# Patient Record
Sex: Female | Born: 1970 | Race: Black or African American | Hispanic: No | Marital: Single | Smoking: Never smoker
Health system: Southern US, Community
[De-identification: ages and names within clinical notes are randomized; demographics above are authoritative.]

## PROBLEM LIST (undated history)

## (undated) DIAGNOSIS — C50919 Malignant neoplasm of unspecified site of unspecified female breast: Secondary | ICD-10-CM

## (undated) DIAGNOSIS — I10 Essential (primary) hypertension: Secondary | ICD-10-CM

## (undated) HISTORY — DX: Essential (primary) hypertension: I10

## (undated) HISTORY — DX: Malignant neoplasm of unspecified site of unspecified female breast: C50.919

---

## 1997-06-23 HISTORY — PX: GALLBLADDER SURGERY: SHX652

## 2004-05-20 ENCOUNTER — Other Ambulatory Visit: Admission: RE | Admit: 2004-05-20 | Discharge: 2004-05-20 | Payer: Self-pay | Admitting: Obstetrics and Gynecology

## 2004-10-26 ENCOUNTER — Emergency Department: Payer: Self-pay | Admitting: Emergency Medicine

## 2005-01-01 ENCOUNTER — Other Ambulatory Visit: Admission: RE | Admit: 2005-01-01 | Discharge: 2005-01-01 | Payer: Self-pay | Admitting: Obstetrics and Gynecology

## 2005-06-27 ENCOUNTER — Other Ambulatory Visit: Admission: RE | Admit: 2005-06-27 | Discharge: 2005-06-27 | Payer: Self-pay | Admitting: Obstetrics and Gynecology

## 2006-07-20 ENCOUNTER — Inpatient Hospital Stay (HOSPITAL_COMMUNITY): Admission: AD | Admit: 2006-07-20 | Discharge: 2006-07-22 | Payer: Self-pay | Admitting: Obstetrics and Gynecology

## 2006-09-07 ENCOUNTER — Encounter: Payer: Self-pay | Admitting: Internal Medicine

## 2006-09-07 LAB — CONVERTED CEMR LAB

## 2006-10-22 ENCOUNTER — Ambulatory Visit: Payer: Self-pay | Admitting: Internal Medicine

## 2006-10-22 LAB — CONVERTED CEMR LAB
ALT: 17 units/L (ref 0–40)
AST: 18 units/L (ref 0–37)
Albumin: 3.8 g/dL (ref 3.5–5.2)
Alkaline Phosphatase: 66 units/L (ref 39–117)
BUN: 10 mg/dL (ref 6–23)
Bacteria, UA: NEGATIVE
Basophils Absolute: 0 10*3/uL (ref 0.0–0.1)
Basophils Relative: 0.6 % (ref 0.0–1.0)
Bilirubin Urine: NEGATIVE
Bilirubin, Direct: 0.2 mg/dL (ref 0.0–0.3)
CO2: 30 meq/L (ref 19–32)
Calcium: 9.4 mg/dL (ref 8.4–10.5)
Chloride: 108 meq/L (ref 96–112)
Cholesterol: 149 mg/dL (ref 0–200)
Creatinine, Ser: 0.6 mg/dL (ref 0.4–1.2)
Crystals: NEGATIVE
Eosinophils Absolute: 0.2 10*3/uL (ref 0.0–0.6)
Eosinophils Relative: 3.6 % (ref 0.0–5.0)
GFR calc Af Amer: 144 mL/min
GFR calc non Af Amer: 119 mL/min
Glucose, Bld: 85 mg/dL (ref 70–99)
HCT: 40.4 % (ref 36.0–46.0)
HDL: 50.4 mg/dL (ref >39.0)
Hemoglobin, Urine: NEGATIVE
Hemoglobin: 13.5 g/dL (ref 12.0–15.0)
Ketones, ur: NEGATIVE mg/dL
LDL Cholesterol: 89 mg/dL (ref 0–99)
Leukocytes, UA: NEGATIVE
Lymphocytes Relative: 34.6 % (ref 12.0–46.0)
MCHC: 33.4 g/dL (ref 30.0–36.0)
MCV: 86.6 fL (ref 78.0–100.0)
Monocytes Absolute: 0.4 10*3/uL (ref 0.2–0.7)
Monocytes Relative: 8.6 % (ref 3.0–11.0)
Mucus, UA: NEGATIVE
Neutro Abs: 2.6 10*3/uL (ref 1.4–7.7)
Neutrophils Relative %: 52.6 % (ref 43.0–77.0)
Nitrite: NEGATIVE
Platelets: 245 10*3/uL (ref 150–400)
Potassium: 4.2 meq/L (ref 3.5–5.1)
RBC: 4.67 M/uL (ref 3.87–5.11)
RDW: 13.9 % (ref 11.5–14.6)
Sodium: 142 meq/L (ref 135–145)
Specific Gravity, Urine: 1.015 (ref 1.000–1.03)
TSH: 0.45 microintl units/mL (ref 0.35–5.50)
Total Bilirubin: 0.9 mg/dL (ref 0.3–1.2)
Total CHOL/HDL Ratio: 3
Total Protein, Urine: NEGATIVE mg/dL
Total Protein: 7.2 g/dL (ref 6.0–8.3)
Triglycerides: 48 mg/dL (ref 0–149)
Uric Acid, Serum: 4.4 mg/dL (ref 2.4–7.0)
Urine Glucose: NEGATIVE mg/dL
Urobilinogen, UA: 0.2 (ref 0.0–1.0)
VLDL: 10 mg/dL (ref 0–40)
WBC: 4.9 10*3/uL (ref 4.5–10.5)
pH: 7 (ref 5.0–8.0)

## 2006-11-30 ENCOUNTER — Ambulatory Visit: Payer: Self-pay | Admitting: Internal Medicine

## 2006-11-30 LAB — CONVERTED CEMR LAB
BUN: 9 mg/dL (ref 6–23)
CO2: 29 meq/L (ref 19–32)
Calcium: 9 mg/dL (ref 8.4–10.5)
Chloride: 109 meq/L (ref 96–112)
Creatinine, Ser: 0.7 mg/dL (ref 0.4–1.2)
GFR calc Af Amer: 120 mL/min
GFR calc non Af Amer: 100 mL/min
Glucose, Bld: 93 mg/dL (ref 70–99)
Potassium: 4 meq/L (ref 3.5–5.1)
Sodium: 142 meq/L (ref 135–145)

## 2006-12-28 ENCOUNTER — Ambulatory Visit: Payer: Self-pay

## 2007-02-01 ENCOUNTER — Ambulatory Visit: Payer: Self-pay | Admitting: Internal Medicine

## 2007-03-08 ENCOUNTER — Ambulatory Visit: Payer: Self-pay | Admitting: Internal Medicine

## 2007-03-08 LAB — CONVERTED CEMR LAB
BUN: 11 mg/dL (ref 6–23)
CO2: 32 meq/L (ref 19–32)
Calcium: 9.2 mg/dL (ref 8.4–10.5)
Chloride: 108 meq/L (ref 96–112)
Creatinine, Ser: 0.7 mg/dL (ref 0.4–1.2)
GFR calc Af Amer: 120 mL/min
GFR calc non Af Amer: 100 mL/min
Glucose, Bld: 76 mg/dL (ref 70–99)
Hgb A1c MFr Bld: 5.9 % (ref 4.6–6.0)
Potassium: 4.3 meq/L (ref 3.5–5.1)
Sodium: 143 meq/L (ref 135–145)

## 2007-04-20 ENCOUNTER — Encounter: Payer: Self-pay | Admitting: Internal Medicine

## 2007-04-20 ENCOUNTER — Ambulatory Visit: Payer: Self-pay | Admitting: Internal Medicine

## 2007-04-20 DIAGNOSIS — I10 Essential (primary) hypertension: Secondary | ICD-10-CM | POA: Insufficient documentation

## 2007-04-20 DIAGNOSIS — K209 Esophagitis, unspecified without bleeding: Secondary | ICD-10-CM | POA: Insufficient documentation

## 2007-05-21 ENCOUNTER — Encounter: Payer: Self-pay | Admitting: Internal Medicine

## 2007-05-21 DIAGNOSIS — M25569 Pain in unspecified knee: Secondary | ICD-10-CM

## 2007-05-21 DIAGNOSIS — G8929 Other chronic pain: Secondary | ICD-10-CM | POA: Insufficient documentation

## 2007-06-07 ENCOUNTER — Telehealth (INDEPENDENT_AMBULATORY_CARE_PROVIDER_SITE_OTHER): Payer: Self-pay | Admitting: *Deleted

## 2007-06-14 ENCOUNTER — Encounter: Payer: Self-pay | Admitting: Internal Medicine

## 2007-07-26 ENCOUNTER — Ambulatory Visit: Payer: Self-pay | Admitting: Internal Medicine

## 2009-03-08 ENCOUNTER — Telehealth: Payer: Self-pay | Admitting: Internal Medicine

## 2009-07-19 ENCOUNTER — Telehealth (INDEPENDENT_AMBULATORY_CARE_PROVIDER_SITE_OTHER): Payer: Self-pay | Admitting: *Deleted

## 2009-09-03 ENCOUNTER — Encounter: Admission: RE | Admit: 2009-09-03 | Discharge: 2009-09-03 | Payer: Self-pay | Admitting: Obstetrics and Gynecology

## 2010-07-14 ENCOUNTER — Encounter: Payer: Self-pay | Admitting: Endocrinology

## 2010-07-23 NOTE — Assessment & Plan Note (Signed)
   Vital Signs:  Patient Profile:   40 Years Old Female Height:     57 inches Weight:      224.38 pounds Temp:     98.4 degrees F oral Pulse rate:   75 / minute BP sitting:   135 / 93  (right arm)                 History of Present Illness: 40 year old, African-American female, complains of indigestion x 1 week.  She experiences epigastric pain, mainly in the evening.  She has tried over-the-counter Zantac with mild improvement.  She denies dysphagia or weight loss.  She consumes fair amount of caffeine.  She has frequent belching.    Past Medical History:    Hypertension  Past Surgical History:    Cholecystectomy      Physical Exam  General:     Well-developed,well-nourished,in no acute distress; alert,appropriate and cooperative throughout examination Lungs:     Normal respiratory effort, chest expands symmetrically. Lungs are clear to auscultation, no crackles or wheezes. Heart:     Normal rate and regular rhythm. S1 and S2 normal without gallop, murmur, click, rub or other extra sounds. Abdomen:     Bowel sounds positive,abdomen soft and non-tender without masses, organomegaly or hernias noted. Extremities:     No clubbing, cyanosis, edema, or deformity noted with normal full range of motion of all joints.      Impression & Recommendations:  Problem # 1:  GERD (ICD-530.1) Anti reflux measures discussed. Omeprazole 20mg  by mouth two times a day ac.   Problem # 2:  HYPERTENSION (ICD-401.9) Continue HCTZ.  Her updated medication list for this problem includes:    Hydrochlorothiazide 12.5 Mg Tabs (Hydrochlorothiazide) ..... One by mouth once daily  BP today: 135/93  Labs Reviewed: Creat: 0.7 (03/08/2007) Chol: 149 (10/22/2006)   HDL: 50.4 (10/22/2006)   LDL: 89 (10/22/2006)   TG: 48 (10/22/2006)   Complete Medication List: 1)  Hydrochlorothiazide 12.5 Mg Tabs (Hydrochlorothiazide) .... One by mouth once daily 2)  Eql Omeprazole 20 Mg Tbec  (Omeprazole) .... One by mouth two times a day   Patient Instructions: 1)  Please schedule a follow-up appointment in 1 month. 2)  Anti-reflux measures discussed.    Prescriptions: EQL OMEPRAZOLE 20 MG  TBEC (OMEPRAZOLE) one by mouth two times a day  #60 x 5   Entered and Authorized by:   Dondra Spry DO   Signed by:   Dondra Spry DO on 04/20/2007   Method used:   Electronically sent to ...       [facility] [phone]*       [address]       [place], [place]  [address]       Ph: [phone] or [phone]       Fax: [phone]   RxID:   [id number]  ]

## 2010-07-23 NOTE — Progress Notes (Signed)
Summary: Record request  Request for records received from [facility], [place]. Request forwarded to [facility]. Wilder Glade  July 19, 2009 4:40 PM

## 2010-07-23 NOTE — Assessment & Plan Note (Signed)
Summary: FU-STC   Vital Signs:  Patient Profile:   40 Years Old Female Height:     57 inches Weight:      224 pounds BMI:     48.65 Temp:     97.9 degrees F oral Pulse rate:   63 / minute BP sitting:   128 / 81  (right arm)  Vitals Entered By: Glendell Docker (July 26, 2007 10:58 AM)                 Chief Complaint:  Hypertension Management.  History of Present Illness:  Hypertension Follow-Up      This is a 40 year old woman who presents for Hypertension follow-up.  The patient denies lightheadedness and edema.  The patient denies the following associated symptoms: chest pain.  Compliance with medications (by patient report) has been near 100%.  The patient reports that dietary compliance has been good.  The patient reports exercising occasionally.    Current Allergies (reviewed today): ! PCN ! DARVOCET  Past Surgical History:    Cholecystectomy 1998   Family History:    Mother is age 61 - history of leukemia    Father is age 66 - hypertension    Grandmother with breast cancer  Social History:    Occupation:  Working at [employer]    Single -  two children    Never Smoked    Alcohol use-yes (social)    Drug use-no   Risk Factors:  Tobacco use:  never Drug use:  no Alcohol use:  yes   Review of Systems      See HPI   Physical Exam  General:     alert and overweight-appearing.   Neck:     supple, no masses, and no carotid bruits.   Lungs:     normal respiratory effort and normal breath sounds.   Heart:     normal rate and regular rhythm.   Extremities:     No lower extremity edema     Impression & Recommendations:  Problem # 1:  HYPERTENSION (ICD-401.9) BP is well controlled.  Maintain current medication regimen.  Her updated medication list for this problem includes:    Hydrochlorothiazide 12.5 Mg Tabs (Hydrochlorothiazide) ..... One by mouth once daily  BP today: 128/81 Prior BP: 135/93 (04/20/2007)  Labs Reviewed:  Creat: 0.7 (03/08/2007) Chol: 149 (10/22/2006)   HDL: 50.4 (10/22/2006)   LDL: 89 (10/22/2006)   TG: 48 (10/22/2006)   Complete Medication List: 1)  Hydrochlorothiazide 12.5 Mg Tabs (Hydrochlorothiazide) .... One by mouth once daily 2)  Microgestin 1/20 1-20 Mg-mcg Tabs (Norethindrone acet-ethinyl est) .... Take 1 tablet by mouth once a day  Other Orders: Tdap => 58yrs IM ([address]) Admin 1st Vaccine ([address])   Patient Instructions: 1)  Please schedule a follow-up appointment in 6 months. 2)  BMP prior to visit, ICD-9: 401.9    Prescriptions: HYDROCHLOROTHIAZIDE 12.5 MG  TABS (HYDROCHLOROTHIAZIDE) one by mouth once daily  #30 x 5   Entered and Authorized by:   Dondra Spry DO   Signed by:   Dondra Spry DO on 07/26/2007   Method used:   Electronically sent to ...       [facility] [phone]*       [address]       [place], [place]  [address]       Ph: [phone] or [phone]  Fax: [phone]   RxID:   [email or web address]  ]  Tetanus/Td Vaccine    Vaccine Type: Tdap    Site: left deltoid    Mfr: Sanofi Pasteur    Dose: 0.5 ml    Route: IM    Given by: Glendell Docker    Exp. Date: 05/19/2009    Lot #: [id number]    VIS given: 01/01/05 version given July 26, 2007.

## 2010-07-23 NOTE — Progress Notes (Signed)
Summary: leg problem  Phone Note Call from Patient Call back at Home Phone [phone]   Summary of Call: Friday pt felt a pulling sensation on the back of her calf. She says immediatly after that there was a lump which went away quickly. Then a large "50 cent piece" size blue/purple circle developed. It was only painful when it first happened but pt is conserned & wants to know if she should come in or be conserned at all.  Initial call taken by: Lamar Sprinkles,  June 07, 2007 10:49 AM  Follow-up for Phone Call        rec ov for evaluation Follow-up by: D Thomos Lemons DO,  June 07, 2007 5:30 PM  Additional Follow-up for Phone Call Additional follow up Details #1::        Will call back at 10 when pt comes into work Additional Follow-up by: Jonnie Finner,  June 08, 2007 8:43 AM    Additional Follow-up for Phone Call Additional follow up Details #2::    THE PATIENT HAS AN APPT Jun 14, 2007 WITH DR Artist Pais. Follow-up by: Hilarie Fredrickson,  June 08, 2007 9:48 AM

## 2010-10-04 ENCOUNTER — Emergency Department: Payer: Self-pay | Admitting: Emergency Medicine

## 2010-11-08 NOTE — Assessment & Plan Note (Signed)
[name]                           PRIMARY CARE OFFICE NOTE   NAME:Ball, Audrey                     MRN:          [id number]  DATE:10/22/2006                            DOB:          1970/06/24    CHIEF COMPLAINT:  New patient to practice/uncontrolled hypertension.   HISTORY OF PRESENT ILLNESS:  Patient is a 40 year old African-American  female referred by Dr. Huntley Dec regarding hypertension.  The patient  states that she has had blood pressure issues, initially at age 30.  She  took blood pressure medications for about one year but then stopped it  on her own.  She has had follow-up visits with her gynecologist, where  she notes normal blood pressure.  In 1996, at the end of her first  pregnancy, they noted elevated blood pressure readings.  She did not  follow up with her primary care physician.  She, however,  no blood  pressure issues were noted during the birth of her second child.  She  has been concerned about her weight.  She notes taking an over-the-  counter supplement for weight loss.  Most recently within the last week  or so, she has been on diethylpropion 75 mg p.o. q.a.m.  She has not  taken this medication since last Thursday.  At the same time, she has  been struggling with left knee pain.  She has been seen by Dr. Kennith Center,  orthopedic physician, in [place], [place].  Her left knee pain  was presumed secondary to arthritis.  She was given Aleve, which she has  taken on a regular basis.   There is no note of workup for possible secondary hypertension in the  past.   PAST MEDICAL HISTORY:  1. Hypertension history.  2. Status post cholecystectomy in 1998.  3. Left knee pain.   CURRENT MEDICATIONS:  Aleve p.r.n.   ALLERGIES TO MEDICATIONS:  PENICILLIN, which causes a rash.   SOCIAL HISTORY:  Patient is single.  Has two children.  Currently living  with a significant other.  Currently working at [employer].   FAMILY HISTORY:  Mother is age 46, noted to have leukemia.  Father is 72  and has hypertension.  She also has two brothers that are hypertensive.  Grandparents are noted to have breast cancer, high blood pressure,  stroke, and diabetes.   HABITS:  Occasional alcohol.  No history of tobacco use.  Denies any  recreational drug use.   REVIEW OF SYSTEMS:  As noted above.  Denies any chest pain, history of  mild occasional intermittent dyspnea on exertion.  No heartburn, nausea,  vomiting, constipation, diarrhea.  All other systems are negative.   PHYSICAL EXAMINATION:  VITAL SIGNS:  Weight is 213 pounds.  Temperature  is 97.  Pulse is 72.  BP is 188/110 in the left arm in the seated  position.  GENERAL:  Patient is a pleasant, well-developed, well-nourished,  slightly overweight 40 year old African-American female who presents her  stated age.  HEENT:  Normocephalic, atraumatic.  Pupils are equal and reactive to  light bilaterally.  Extraocular motility was intact.  Patient was  anicteric.  Conjunctivae were within normal limits.  External auditory  canals intact.  Tympanic membranes are clear bilaterally.  Oropharyngeal  exam is unremarkable.  NECK:  Supple.  No adenopathy, carotid bruits, or thyromegaly.  LUNGS:  Normal respiratory effort.  Chest is clear to auscultation  bilaterally.  No rales, rhonchi or wheezing.  CARDIOVASCULAR:  Regular rate and rhythm.  No significant murmurs, rubs  or gallops are appreciated.  ABDOMEN:  Slightly protuberant, nontender.  There was no abdominal  bruit.  MUSCULOSKELETAL:  No clubbing, cyanosis or edema.  Patient had  diminished but palpable pedis dorsalis pulses that were posterior  tibialis pulses.  NEUROLOGIC:  Cranial nerves II-XII are grossly intact.  She is nonfocal.   EKG was performed in the office, which showed normal sinus rhythm at 59  beats per minute.  No ST changes were noted.  No evidence of left  ventricular  hypertrophy.  Slightly poor R wave progression, question  anterior septal infarct, age undetermined.   IMPRESSION:  1. Hypertension, uncontrolled.  Question secondary hypertension.  2. Chronic left knee pain, presumed osteoarthritis.  3. Health maintenance.   RECOMMENDATIONS:  We will obtain baseline labs, including kidney  functions, which we discussed at length, including complications of  untreated hypertension.  Her clinical history is somewhat suspicious for  secondary hypertension.  She will be sent to [facility] for  renal artery Doppler to rule out renal artery stenosis.  In addition to  routine labs today, we will draw serum-free metanephrine levels.  Other  possibilities include hyperaldosterone state and hypercortisol state.   Patient was told to discontinue Aleve.  We may use Darvocet p.r.n. for  pain control.  She was also instructed to follow a low salt diet.   If kidney function is normal, she is to start Azor 20/5.  She is to take  one tablet in the morning.  We will arrange followup in approximately 4-  6 weeks.     Barbette Hair. Artist Pais, DO  Electronically Signed    RDY/MedQ  DD: 10/22/2006  DT: 10/22/2006  Job #: [id number]

## 2012-11-01 ENCOUNTER — Other Ambulatory Visit: Payer: Self-pay

## 2012-11-01 LAB — CBC WITH DIFFERENTIAL/PLATELET
Basophil #: 0.1 10*3/uL (ref 0.0–0.1)
Basophil %: 1.3 %
Eosinophil #: 0.2 10*3/uL (ref 0.0–0.7)
Eosinophil %: 3.9 %
HCT: 39.8 % (ref 35.0–47.0)
HGB: 13.1 g/dL (ref 12.0–16.0)
Lymphocyte #: 2.2 10*3/uL (ref 1.0–3.6)
Lymphocyte %: 40.7 %
MCH: 28.3 pg (ref 26.0–34.0)
MCHC: 33 g/dL (ref 32.0–36.0)
MCV: 86 fL (ref 80–100)
Monocyte #: 0.5 x10 3/mm (ref 0.2–0.9)
Monocyte %: 9.2 %
Neutrophil #: 2.4 10*3/uL (ref 1.4–6.5)
Neutrophil %: 44.9 %
Platelet: 251 10*3/uL (ref 150–440)
RBC: 4.64 10*6/uL (ref 3.80–5.20)
RDW: 14.4 % (ref 11.5–14.5)
WBC: 5.3 10*3/uL (ref 3.6–11.0)

## 2012-11-01 LAB — LIPID PANEL
Cholesterol: 122 mg/dL (ref 0–200)
HDL Cholesterol: 31 mg/dL — ABNORMAL LOW (ref 40–60)
Ldl Cholesterol, Calc: 71 mg/dL (ref 0–100)
Triglycerides: 100 mg/dL (ref 0–200)
VLDL Cholesterol, Calc: 20 mg/dL (ref 5–40)

## 2012-11-01 LAB — COMPREHENSIVE METABOLIC PANEL
Albumin: 3.5 g/dL (ref 3.4–5.0)
Alkaline Phosphatase: 47 U/L — ABNORMAL LOW (ref 50–136)
Anion Gap: 5 — ABNORMAL LOW (ref 7–16)
BUN: 11 mg/dL (ref 7–18)
Bilirubin,Total: 0.3 mg/dL (ref 0.2–1.0)
Calcium, Total: 8.6 mg/dL (ref 8.5–10.1)
Chloride: 108 mmol/L — ABNORMAL HIGH (ref 98–107)
Co2: 29 mmol/L (ref 21–32)
Creatinine: 0.61 mg/dL (ref 0.60–1.30)
EGFR (African American): >60
EGFR (Non-African Amer.): >60
Glucose: 90 mg/dL (ref 65–99)
Osmolality: 282 (ref 275–301)
Potassium: 3.5 mmol/L (ref 3.5–5.1)
SGOT(AST): 21 U/L (ref 15–37)
SGPT (ALT): 18 U/L (ref 12–78)
Sodium: 142 mmol/L (ref 136–145)
Total Protein: 6.7 g/dL (ref 6.4–8.2)

## 2012-11-01 LAB — T4, FREE: Free Thyroxine: 1.02 ng/dL (ref 0.76–1.46)

## 2012-11-01 LAB — TSH: Thyroid Stimulating Horm: 0.438 u[IU]/mL — ABNORMAL LOW

## 2012-11-25 ENCOUNTER — Ambulatory Visit: Payer: Self-pay

## 2012-12-15 ENCOUNTER — Ambulatory Visit: Payer: Self-pay

## 2012-12-15 LAB — CREATININE, SERUM
Creatinine: 0.79 mg/dL (ref 0.60–1.30)
EGFR (African American): >60
EGFR (Non-African Amer.): >60

## 2013-06-21 ENCOUNTER — Ambulatory Visit: Payer: Self-pay

## 2013-07-18 ENCOUNTER — Ambulatory Visit: Payer: Self-pay | Admitting: Orthopedic Surgery

## 2013-11-25 DIAGNOSIS — M5417 Radiculopathy, lumbosacral region: Secondary | ICD-10-CM | POA: Insufficient documentation

## 2014-06-23 HISTORY — PX: TOTAL VAGINAL HYSTERECTOMY: SHX2548

## 2014-06-23 HISTORY — PX: PARTIAL HYSTERECTOMY: SHX80

## 2014-06-27 DIAGNOSIS — N939 Abnormal uterine and vaginal bleeding, unspecified: Secondary | ICD-10-CM | POA: Insufficient documentation

## 2014-06-27 DIAGNOSIS — R946 Abnormal results of thyroid function studies: Secondary | ICD-10-CM | POA: Insufficient documentation

## 2015-06-24 HISTORY — PX: BACK SURGERY: SHX140

## 2015-09-10 ENCOUNTER — Other Ambulatory Visit: Payer: Self-pay | Admitting: Orthopedic Surgery

## 2015-09-10 DIAGNOSIS — M79604 Pain in right leg: Secondary | ICD-10-CM

## 2015-09-10 DIAGNOSIS — M5416 Radiculopathy, lumbar region: Secondary | ICD-10-CM

## 2015-09-18 ENCOUNTER — Other Ambulatory Visit (HOSPITAL_COMMUNITY): Payer: Self-pay | Admitting: Orthopedic Surgery

## 2015-09-18 DIAGNOSIS — M5416 Radiculopathy, lumbar region: Secondary | ICD-10-CM

## 2015-09-18 DIAGNOSIS — M79604 Pain in right leg: Secondary | ICD-10-CM

## 2015-09-20 ENCOUNTER — Ambulatory Visit (HOSPITAL_COMMUNITY)
Admission: RE | Admit: 2015-09-20 | Discharge: 2015-09-20 | Disposition: A | Discharge status: Home / Self Care | Payer: No Typology Code available for payment source | Source: Ambulatory Visit | Attending: Orthopedic Surgery | Admitting: Orthopedic Surgery

## 2015-09-20 DIAGNOSIS — M79604 Pain in right leg: Secondary | ICD-10-CM | POA: Diagnosis present

## 2015-09-20 DIAGNOSIS — M5416 Radiculopathy, lumbar region: Secondary | ICD-10-CM | POA: Diagnosis not present

## 2015-09-20 DIAGNOSIS — M5117 Intervertebral disc disorders with radiculopathy, lumbosacral region: Secondary | ICD-10-CM | POA: Insufficient documentation

## 2015-09-20 DIAGNOSIS — G544 Lumbosacral root disorders, not elsewhere classified: Secondary | ICD-10-CM | POA: Diagnosis not present

## 2015-09-28 ENCOUNTER — Ambulatory Visit: Payer: Self-pay

## 2015-10-01 ENCOUNTER — Ambulatory Visit: Payer: Self-pay

## 2015-11-02 DIAGNOSIS — M5416 Radiculopathy, lumbar region: Secondary | ICD-10-CM

## 2015-11-02 DIAGNOSIS — M48061 Spinal stenosis, lumbar region without neurogenic claudication: Secondary | ICD-10-CM | POA: Insufficient documentation

## 2015-11-02 DIAGNOSIS — R059 Cough, unspecified: Secondary | ICD-10-CM | POA: Insufficient documentation

## 2015-11-02 DIAGNOSIS — R05 Cough: Secondary | ICD-10-CM | POA: Insufficient documentation

## 2016-10-16 ENCOUNTER — Ambulatory Visit
Admission: RE | Admit: 2016-10-16 | Discharge: 2016-10-16 | Disposition: A | Discharge status: Home / Self Care | Payer: Commercial Managed Care - HMO | Source: Ambulatory Visit | Attending: Nurse Practitioner | Admitting: Nurse Practitioner

## 2016-10-16 ENCOUNTER — Other Ambulatory Visit: Payer: Self-pay | Admitting: Nurse Practitioner

## 2016-10-16 DIAGNOSIS — R51 Headache: Secondary | ICD-10-CM | POA: Insufficient documentation

## 2016-10-16 DIAGNOSIS — R519 Headache, unspecified: Secondary | ICD-10-CM

## 2017-06-10 ENCOUNTER — Other Ambulatory Visit: Payer: Self-pay | Admitting: Nurse Practitioner

## 2017-06-10 MED ORDER — IBUPROFEN 800 MG PO TABS: 800.0000 mg | ORAL_TABLET | Freq: Three times a day (TID) | ORAL | 1 refills | Status: DC | PRN

## 2017-06-11 ENCOUNTER — Other Ambulatory Visit: Payer: Self-pay

## 2017-06-26 ENCOUNTER — Other Ambulatory Visit: Payer: Self-pay

## 2017-06-26 ENCOUNTER — Telehealth: Payer: Self-pay

## 2017-06-26 ENCOUNTER — Other Ambulatory Visit: Payer: Self-pay | Admitting: Nurse Practitioner

## 2017-06-26 DIAGNOSIS — M5440 Lumbago with sciatica, unspecified side: Principal | ICD-10-CM

## 2017-06-26 DIAGNOSIS — G8929 Other chronic pain: Secondary | ICD-10-CM

## 2017-06-26 MED ORDER — TRAMADOL HCL 50 MG PO TABS: 50.0000 mg | ORAL_TABLET | Freq: Two times a day (BID) | ORAL | 1 refills | Status: DC | PRN

## 2017-06-26 NOTE — Telephone Encounter (Signed)
Error

## 2017-06-26 NOTE — Progress Notes (Signed)
Renewed tramadol 50mg  BID prn pain with #45 tablets and 1 refill.

## 2017-06-26 NOTE — Telephone Encounter (Signed)
Called in tramadol 50 mg tablet, sig: take 1 tab by mouth twice a day as needed for pain [id number] as per Leretha Pol, NP.

## 2017-06-29 ENCOUNTER — Other Ambulatory Visit: Payer: Self-pay

## 2017-06-29 MED ORDER — HYDROCHLOROTHIAZIDE 25 MG PO TABS: 25.0000 mg | ORAL_TABLET | Freq: Every day | ORAL | 5 refills | Status: DC

## 2017-07-13 ENCOUNTER — Ambulatory Visit: Payer: Self-pay | Admitting: Nurse Practitioner

## 2017-08-24 ENCOUNTER — Telehealth: Payer: Self-pay

## 2017-08-24 NOTE — Telephone Encounter (Signed)
Pt advise need appt for tramadol refills

## 2017-08-24 NOTE — Telephone Encounter (Signed)
Pt ne

## 2017-09-01 ENCOUNTER — Encounter: Payer: Self-pay | Admitting: Internal Medicine

## 2017-09-01 ENCOUNTER — Ambulatory Visit: Payer: 59 | Admitting: Internal Medicine

## 2017-09-01 VITALS — BP 134/92 | HR 64 | Resp 16 | Ht 67.0 in | Wt 208.4 lb

## 2017-09-01 DIAGNOSIS — M545 Low back pain, unspecified: Secondary | ICD-10-CM

## 2017-09-01 DIAGNOSIS — Z0001 Encounter for general adult medical examination with abnormal findings: Secondary | ICD-10-CM

## 2017-09-01 DIAGNOSIS — N951 Menopausal and female climacteric states: Secondary | ICD-10-CM

## 2017-09-01 DIAGNOSIS — G8929 Other chronic pain: Secondary | ICD-10-CM

## 2017-09-01 DIAGNOSIS — L308 Other specified dermatitis: Secondary | ICD-10-CM

## 2017-09-01 DIAGNOSIS — Z1231 Encounter for screening mammogram for malignant neoplasm of breast: Secondary | ICD-10-CM

## 2017-09-01 DIAGNOSIS — I10 Essential (primary) hypertension: Secondary | ICD-10-CM

## 2017-09-01 DIAGNOSIS — Z1239 Encounter for other screening for malignant neoplasm of breast: Secondary | ICD-10-CM

## 2017-09-01 MED ORDER — TRAMADOL HCL 50 MG PO TABS: ORAL_TABLET | ORAL | 2 refills | Status: DC

## 2017-09-01 MED ORDER — VALACYCLOVIR HCL 1 G PO TABS: 1000.0000 mg | ORAL_TABLET | Freq: Two times a day (BID) | ORAL | 3 refills | Status: DC

## 2017-09-01 MED ORDER — AMPHETAMINE-DEXTROAMPHETAMINE 20 MG PO TABS: ORAL_TABLET | ORAL | 0 refills | Status: DC

## 2017-09-01 MED ORDER — HYDROCHLOROTHIAZIDE 25 MG PO TABS: 25.0000 mg | ORAL_TABLET | Freq: Every day | ORAL | 5 refills | Status: DC

## 2017-09-01 MED ORDER — BETAMETHASONE VALERATE 0.1 % EX OINT: 1.0000 "application " | TOPICAL_OINTMENT | Freq: Two times a day (BID) | CUTANEOUS | 0 refills | Status: DC

## 2017-09-01 NOTE — Progress Notes (Signed)
[facility] [place], [place] [address]  Internal MEDICINE  Office Visit Note  Patient Name: Audrey Ball  [id number]  [id number]  Date of Service: 09/28/2017  Chief Complaint  Patient presents with  . Hypertension  . Osteoarthritis    HPI   Pt is here for routine follow up.  She will like to have refills on her medications.  Lately she is having symptoms of hot flashes and vaginal dryness.  She is c/o back pain too. Inflammation in her joint at times bothers her too.    Surgical History: Past Surgical History:  Procedure Laterality Date  . BACK SURGERY  2017  . GALLBLADDER SURGERY  1999  . PARTIAL HYSTERECTOMY  2016    Medical History: Past Medical History:  Diagnosis Date  . Hypertension     Family History: Family History  Problem Relation Age of Onset  . Leukemia Mother   . Hypertension Mother   . Diabetes Mother   . Lung cancer Father     Social History   Socioeconomic History  . Marital status: Single    Spouse name: Not on file  . Number of children: Not on file  . Years of education: Not on file  . Highest education level: Not on file  Occupational History  . Not on file  Social Needs  . Financial resource strain: Not on file  . Food insecurity:    Worry: Not on file    Inability: Not on file  . Transportation needs:    Medical: Not on file    Non-medical: Not on file  Tobacco Use  . Smoking status: Never Smoker  . Smokeless tobacco: Never Used  Substance and Sexual Activity  . Alcohol use: No    Frequency: Never  . Drug use: No  . Sexual activity: Not on file  Lifestyle  . Physical activity:    Days per week: Not on file    Minutes per session: Not on file  . Stress: Not on file  Relationships  . Social connections:    Talks on phone: Not on file    Gets together: Not on file    Attends religious service: Not on file    Active member of club or organization: Not on file    Attends meetings of clubs  or organizations: Not on file    Relationship status: Not on file  . Intimate partner violence:    Fear of current or ex partner: Not on file    Emotionally abused: Not on file    Physically abused: Not on file    Forced sexual activity: Not on file  Other Topics Concern  . Not on file  Social History Narrative  . Not on file    Review of Systems  Constitutional: Negative for chills, diaphoresis and fatigue.  HENT: Negative for ear pain, postnasal drip and sinus pressure.   Eyes: Negative for photophobia, discharge, redness, itching and visual disturbance.  Respiratory: Negative for cough, shortness of breath and wheezing.   Cardiovascular: Negative for chest pain, palpitations and leg swelling.  Gastrointestinal: Negative for abdominal pain, constipation, diarrhea, nausea and vomiting.  Endocrine: Positive for heat intolerance. Negative for cold intolerance.  Genitourinary: Negative for dysuria and flank pain.  Musculoskeletal: Positive for arthralgias, back pain and joint swelling. Negative for gait problem and neck pain.  Skin: Negative for color change.  Allergic/Immunologic: Negative for environmental allergies and food allergies.  Neurological: Negative for dizziness and headaches.  Hematological: Does not bruise/bleed easily.  Psychiatric/Behavioral: Negative for agitation, behavioral problems (depression) and hallucinations.    Vital Signs: BP (!) 134/92 (BP Location: Left Arm, Patient Position: Sitting)   Pulse 64   Resp 16   Ht '5\' 7"'$  (1.702 m)   Wt 208 lb 6.4 oz (94.5 kg)   SpO2 98%   BMI 32.64 kg/m    Physical Exam  Constitutional: She is oriented to person, place, and time. She appears well-developed and well-nourished. No distress.  HENT:  Head: Normocephalic and atraumatic.  Mouth/Throat: Oropharynx is clear and moist. No oropharyngeal exudate.  Eyes: Pupils are equal, round, and reactive to light. EOM are normal.  Neck: Normal range of motion. Neck  supple. No JVD present. No tracheal deviation present. No thyromegaly present.  Cardiovascular: Normal rate, regular rhythm and normal heart sounds. Exam reveals no gallop and no friction rub.  No murmur heard. Pulmonary/Chest: Effort normal. No respiratory distress. She has no wheezes. She has no rales. She exhibits no tenderness.  Abdominal: Soft. Bowel sounds are normal.  Musculoskeletal: Normal range of motion.  Lymphadenopathy:    She has no cervical adenopathy.  Neurological: She is alert and oriented to person, place, and time. No cranial nerve deficit.  Skin: Skin is warm and dry. She is not diaphoretic.  Psychiatric: She has a normal mood and affect. Her behavior is normal. Judgment and thought content normal.   Assessment/Plan: 1. Chronic bilateral low back pain without sciatica Continue traMADol (ULTRAM) 50 MG tablet; One tab po bid prn  Dispense: 60 tablet; Refill: 2 - FSH/LH - Sed Rate (ESR) - Rheumatoid Factor  2. Menopausal and female climacteric states - FSH/LH  3. Breast cancer screening - Mammogram ordered   4. Essential hypertension, benign Controlled   5. Encounter for routine adult health examination with abnormal findings LABS FOR NEXT VISIT. NO CPE IS PERFORMED  - CBC with Differential/Platelet - Lipid Panel With LDL/HDL Ratio - TSH - T4, free - Comprehensive metabolic panel - Urinalysis  General Counseling: Audrey Ball verbalizes understanding of the findings of todays visit and agrees with plan of treatment. I have discussed any further diagnostic evaluation that may be needed or ordered today. We also reviewed her medications today. she has been encouraged to call the office with any questions or concerns that should arise related to todays visit.    Orders Placed This Encounter  Procedures  . CBC with Differential/Platelet  . Lipid Panel With LDL/HDL Ratio  . TSH  . T4, free  . Comprehensive metabolic panel  . Urinalysis  . FSH/LH  . Sed Rate  (ESR)  . Rheumatoid Factor    Meds ordered this encounter  Medications  . traMADol (ULTRAM) 50 MG tablet    Sig: One tab po bid prn    Dispense:  60 tablet    Refill:  2  . valACYclovir (VALTREX) 1000 MG tablet    Sig: Take 1 tablet (1,000 mg total) by mouth 2 (two) times daily.    Dispense:  60 tablet    Refill:  3  . hydrochlorothiazide (HYDRODIURIL) 25 MG tablet    Sig: Take 1 tablet (25 mg total) by mouth daily.    Dispense:  30 tablet    Refill:  5  . amphetamine-dextroamphetamine (ADDERALL) 20 MG tablet    Sig: TAKE 1 TABLET BY MOUTH TWICE A DAY AS NEEDED FOR FOCUS    Dispense:  60 tablet    Refill:  0  . betamethasone valerate ointment (  VALISONE) 0.1 %    Sig: Apply 1 application topically 2 (two) times daily.    Dispense:  30 g    Refill:  0    Time spent:25 Minutes    Dr Lavera Guise Internal medicine

## 2018-01-11 ENCOUNTER — Ambulatory Visit: Payer: Self-pay | Admitting: Nurse Practitioner

## 2018-02-18 IMAGING — CT CT HEAD W/O CM
3 series · 16 of 47 positions shown, 19 images · non-contrast
Comparison: None.

CLINICAL DATA: Headaches for several days

EXAM:
CT HEAD WITHOUT CONTRAST
TECHNIQUE: Contiguous axial images were obtained from the base of the skull
through the vertex without intravenous contrast.

[Series 2: head wo · axial · 0.41mm/px · z∈[+574,+699]mm · 10 of 30 slices shown, 13 images]
[im 3/30  brain]
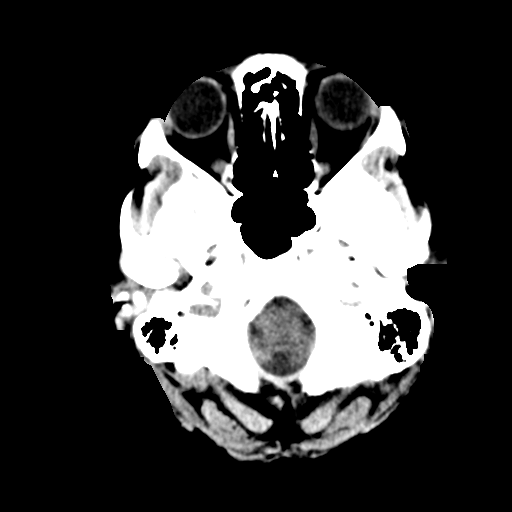
[im 3/30  bone]
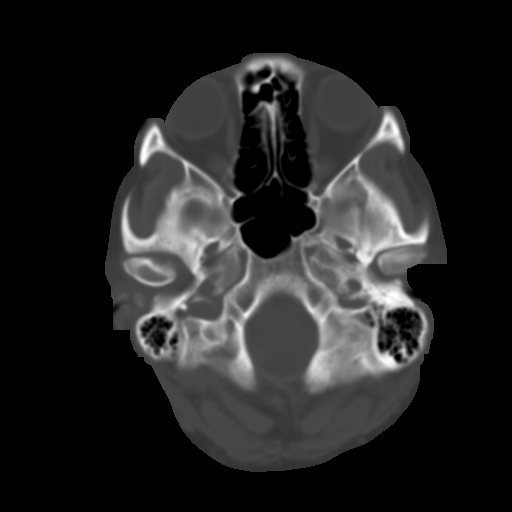
[im 6/30  brain]
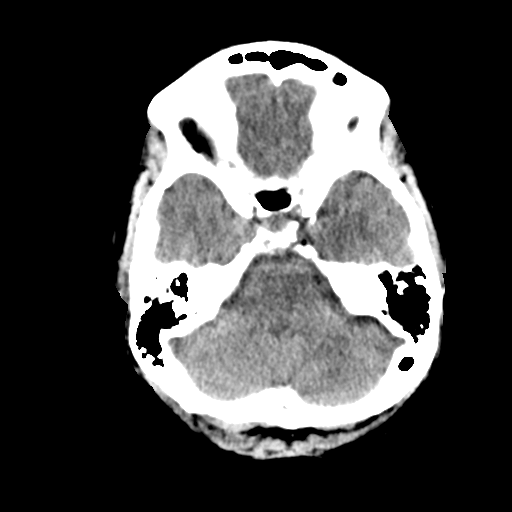
[im 9/30  brain]
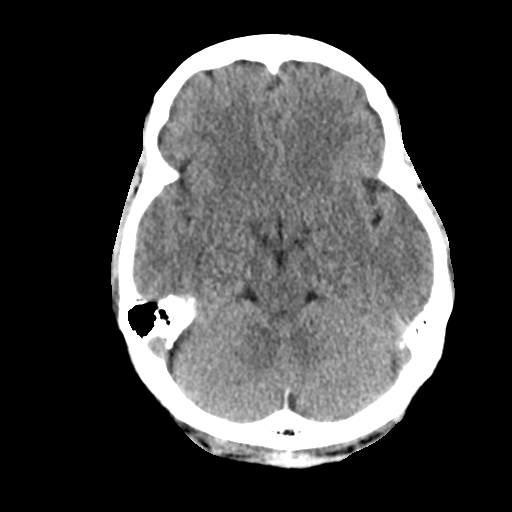
[im 11/30  brain]
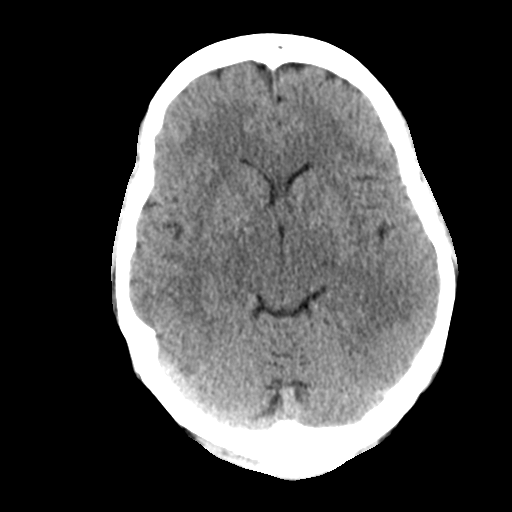
[im 14/30  brain]
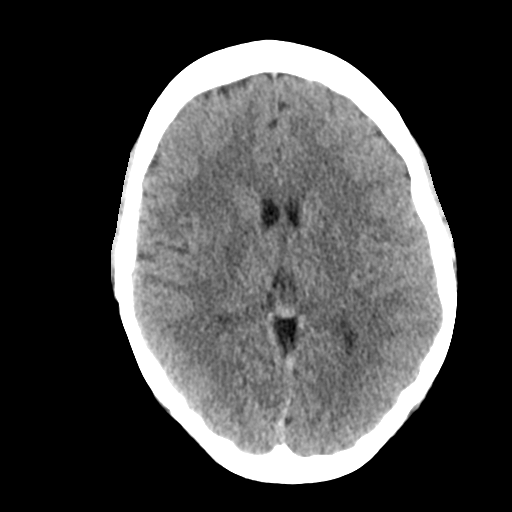
[im 14/30  bone]
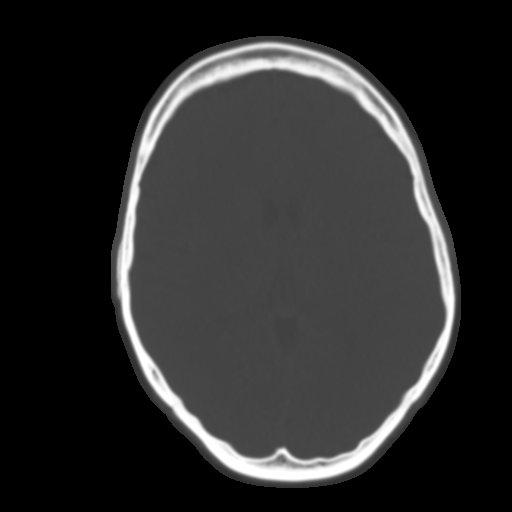
[im 17/30  brain]
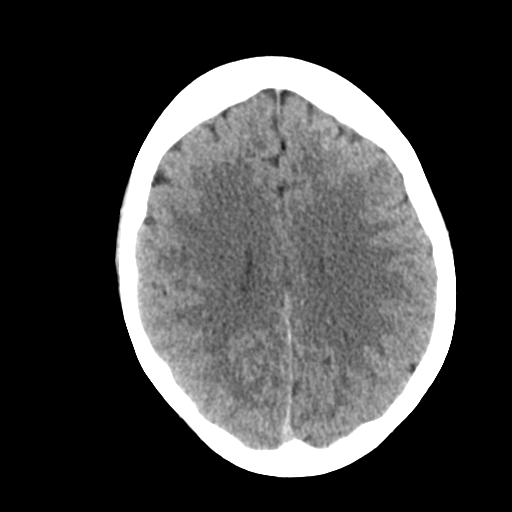
[im 20/30  brain]
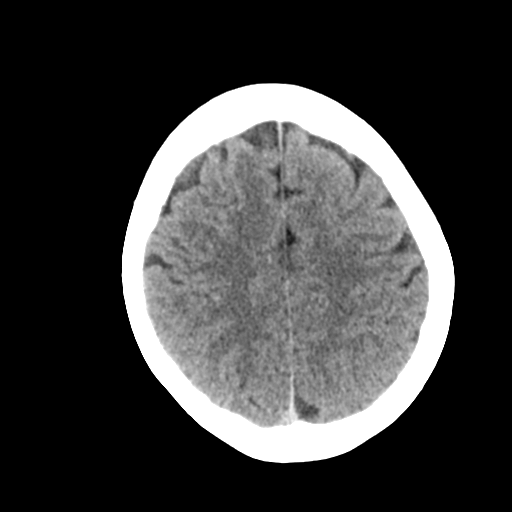
[im 23/30  brain]
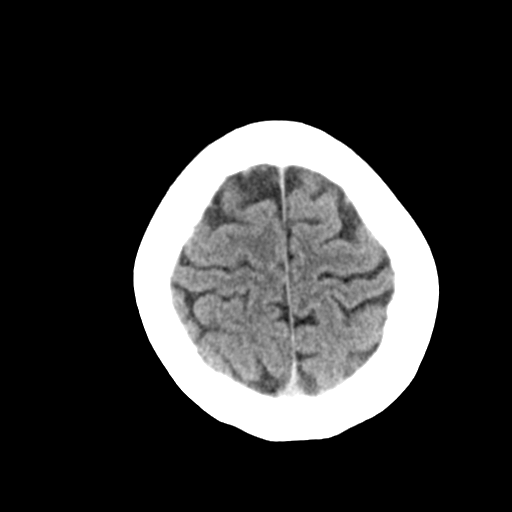
[im 25/30  brain]
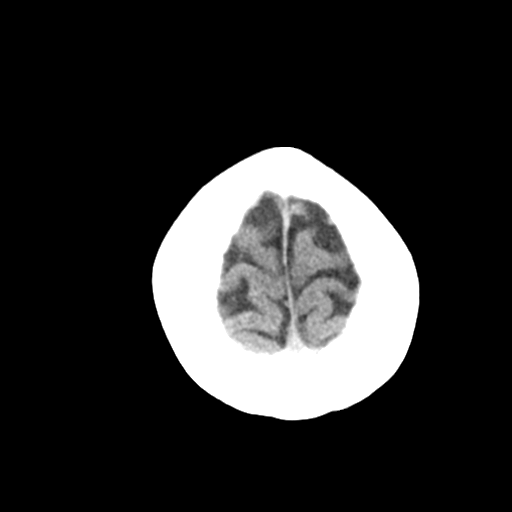
[im 25/30  bone]
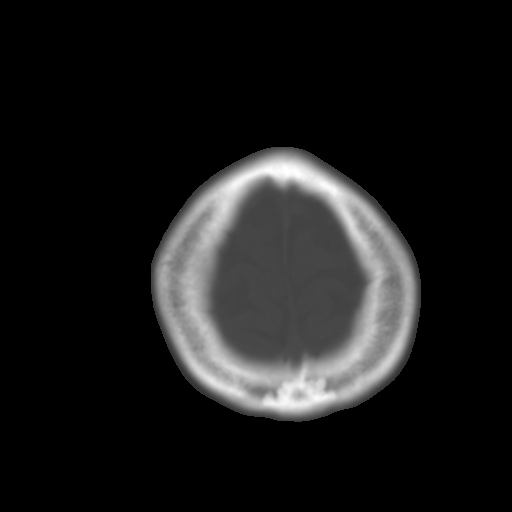
[im 28/30  brain]
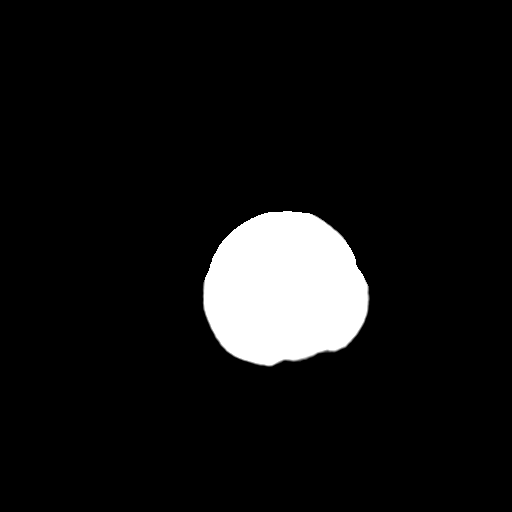

[Series 4: coronal soft tissue · coronal · 0.30mm/px · 3 of 68 slices shown]
[im 23/68  brain]
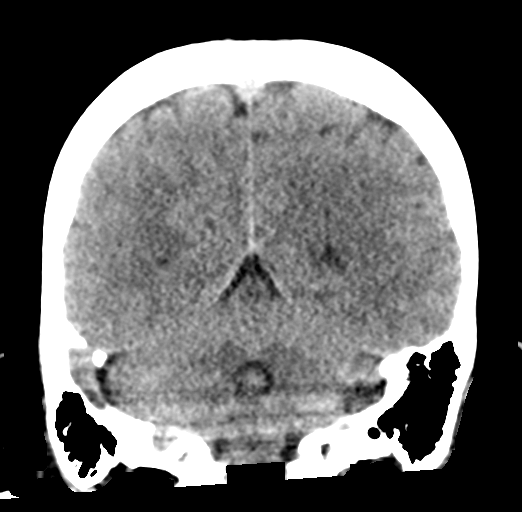
[im 30/68  brain]
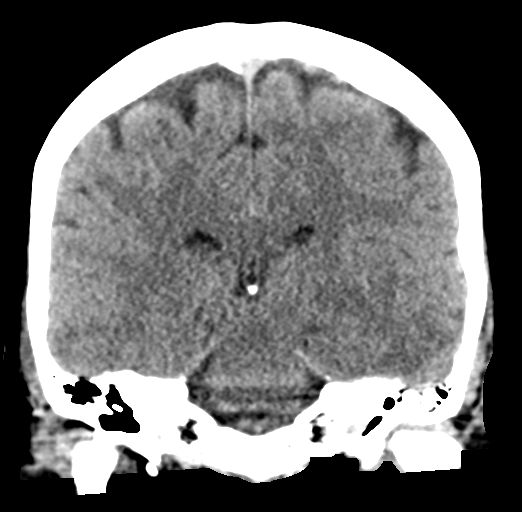
[im 38/68  brain]
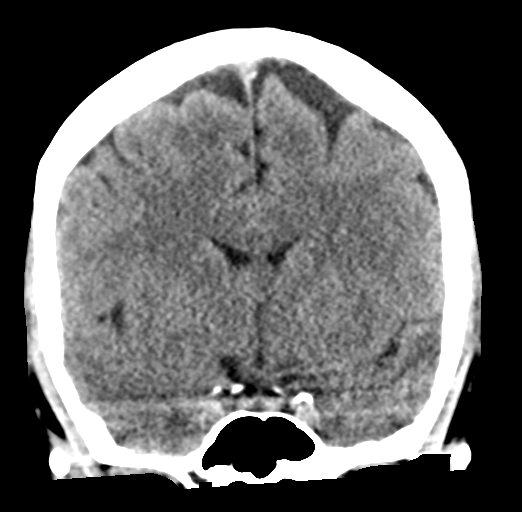

[Series 5: sagittal soft tissue · sagittal · 0.29mm/px · 3 of 55 slices shown]
[im 19/55  brain]
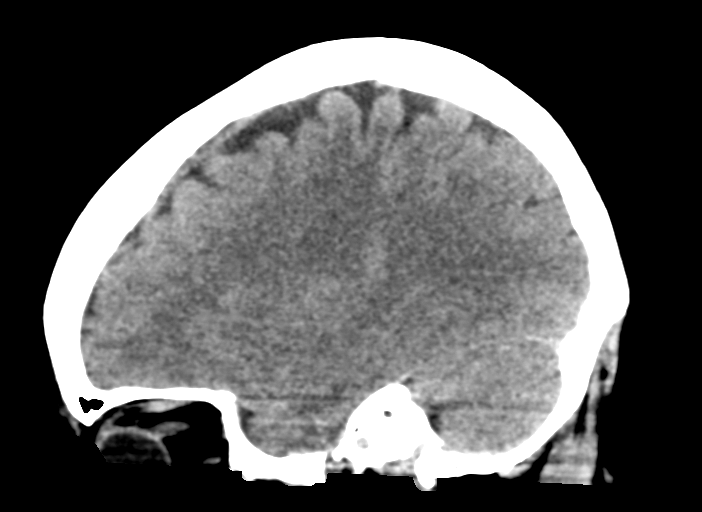
[im 28/55  brain]
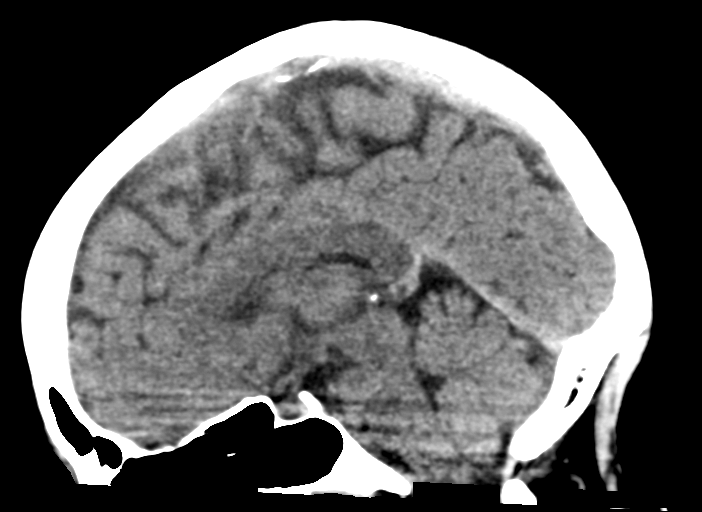
[im 37/55  brain]
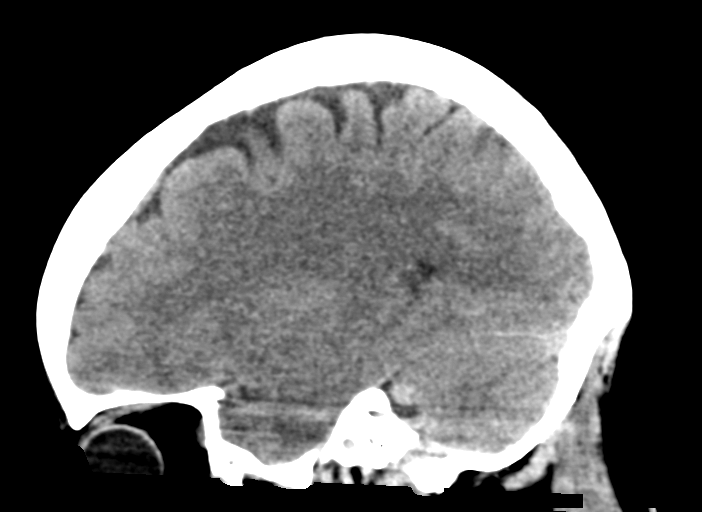

[16 of 47 positions shown; findings below may reference images not displayed]

FINDINGS: Brain: No evidence of acute infarction, hemorrhage, hydrocephalus,
extra-axial collection or mass lesion/mass effect.

Vascular: No hyperdense vessel or unexpected calcification.

Skull: Normal. Negative for fracture or focal lesion.

Sinuses/Orbits: No acute finding.

Other: None.
IMPRESSION: No acute intracranial abnormality noted.

## 2018-03-08 ENCOUNTER — Encounter: Payer: Self-pay | Admitting: Nurse Practitioner

## 2018-03-08 ENCOUNTER — Ambulatory Visit (INDEPENDENT_AMBULATORY_CARE_PROVIDER_SITE_OTHER): Payer: BLUE CROSS/BLUE SHIELD | Admitting: Nurse Practitioner

## 2018-03-08 VITALS — BP 140/90 | HR 66 | Resp 16 | Ht 67.0 in | Wt 227.0 lb

## 2018-03-08 DIAGNOSIS — B001 Herpesviral vesicular dermatitis: Secondary | ICD-10-CM

## 2018-03-08 DIAGNOSIS — I10 Essential (primary) hypertension: Secondary | ICD-10-CM | POA: Diagnosis not present

## 2018-03-08 DIAGNOSIS — Z124 Encounter for screening for malignant neoplasm of cervix: Secondary | ICD-10-CM | POA: Diagnosis not present

## 2018-03-08 DIAGNOSIS — Z1239 Encounter for other screening for malignant neoplasm of breast: Secondary | ICD-10-CM

## 2018-03-08 DIAGNOSIS — Z0001 Encounter for general adult medical examination with abnormal findings: Secondary | ICD-10-CM | POA: Diagnosis not present

## 2018-03-08 DIAGNOSIS — F988 Other specified behavioral and emotional disorders with onset usually occurring in childhood and adolescence: Secondary | ICD-10-CM | POA: Diagnosis not present

## 2018-03-08 DIAGNOSIS — Z79899 Other long term (current) drug therapy: Secondary | ICD-10-CM | POA: Insufficient documentation

## 2018-03-08 DIAGNOSIS — Z1231 Encounter for screening mammogram for malignant neoplasm of breast: Secondary | ICD-10-CM

## 2018-03-08 LAB — HM PAP SMEAR: HM Pap smear: NORMAL

## 2018-03-08 MED ORDER — HYDROCHLOROTHIAZIDE 25 MG PO TABS: 25.0000 mg | ORAL_TABLET | Freq: Every day | ORAL | 5 refills | Status: DC

## 2018-03-08 MED ORDER — AMPHETAMINE-DEXTROAMPHETAMINE 20 MG PO TABS: ORAL_TABLET | ORAL | 0 refills | Status: DC

## 2018-03-08 MED ORDER — VALACYCLOVIR HCL 1 G PO TABS: 1000.0000 mg | ORAL_TABLET | Freq: Two times a day (BID) | ORAL | 3 refills | Status: DC

## 2018-03-08 NOTE — Progress Notes (Signed)
[facility] [place], [place] [address]  Internal MEDICINE  Office Visit Note  Patient Name: MARLEN KOMAN  [id number]  [id number]  Date of Service: 03/08/2018   Pt is here for routine health maintenance examination    Chief Complaint  Patient presents with  . Hypertension  . Annual Exam     The patient is c/o problems with focus and concentration at work. 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 Has been on adderall in the past. Took 20mg  twice daily when needed, and only when at work and at school. She did well with this and would like to try to take this again.     Current Medication: Outpatient Encounter Medications as of 03/08/2018  Medication Sig  . amitriptyline (ELAVIL) 100 MG tablet TAKE 1 TABLET (100 MG TOTAL) BY MOUTH NIGHTLY.  Marland Kitchen amphetamine-dextroamphetamine (ADDERALL) 20 MG tablet TAKE 1 TABLET BY MOUTH TWICE A DAY AS NEEDED FOR FOCUS  . betamethasone valerate ointment (VALISONE) 0.1 % Apply 1 application topically 2 (two) times daily.  . ferrous sulfate 325 (65 FE) MG EC tablet Take by mouth.  . fluconazole (DIFLUCAN) 150 MG tablet TAKE 1 TABLET(S) BY MOUTH ONE TIME WEEKLY FOR YEAST INFECTION  . hydrochlorothiazide (HYDRODIURIL) 25 MG tablet Take 1 tablet (25 mg total) by mouth daily.  . traMADol (ULTRAM) 50 MG tablet One tab po bid prn  . valACYclovir (VALTREX) 1000 MG tablet Take 1 tablet (1,000 mg total) by mouth 2 (two) times daily.  . [DISCONTINUED] amphetamine-dextroamphetamine (ADDERALL) 20 MG tablet TAKE 1 TABLET BY MOUTH TWICE A DAY AS NEEDED FOR FOCUS  . [DISCONTINUED] amphetamine-dextroamphetamine (ADDERALL) 20 MG tablet TAKE 1 TABLET BY MOUTH TWICE A DAY AS NEEDED FOR FOCUS  . [DISCONTINUED] amphetamine-dextroamphetamine (ADDERALL) 20 MG tablet TAKE 1 TABLET BY MOUTH TWICE A DAY AS NEEDED FOR FOCUS  . [DISCONTINUED]  hydrochlorothiazide (HYDRODIURIL) 25 MG tablet Take 1 tablet (25 mg total) by mouth daily.  . [DISCONTINUED] valACYclovir (VALTREX) 1000 MG tablet Take 1 tablet (1,000 mg total) by mouth 2 (two) times daily.  . [DISCONTINUED] cyclobenzaprine (FLEXERIL) 10 MG tablet Take 10 mg by mouth.  . [DISCONTINUED] ibuprofen (ADVIL,MOTRIN) 800 MG tablet Take 1 tablet (800 mg total) by mouth 3 (three) times daily as needed. (Patient not taking: Reported on 03/08/2018)  . [DISCONTINUED] methocarbamol (ROBAXIN) 500 MG tablet TAKE 1 TABLET BY MOUTH 4 TIMES A DAY  . [DISCONTINUED] oxyCODONE (OXY IR/ROXICODONE) 5 MG immediate release tablet Take 5-10 mg by mouth.   No facility-administered encounter medications on file as of 03/08/2018.     Surgical History: Past Surgical History:  Procedure Laterality Date  . BACK SURGERY  2017  . GALLBLADDER SURGERY  1999  . PARTIAL HYSTERECTOMY  2016    Medical History: Past Medical History:  Diagnosis Date  . Hypertension     Family History: Family History  Problem Relation Age of Onset  . Leukemia Mother   . Hypertension Mother   . Diabetes Mother   . Lung cancer Father       Review of Systems  Constitutional: Negative for chills, diaphoresis, fatigue and unexpected weight change.  HENT: Negative for ear pain, postnasal drip and sinus pressure.   Eyes: Negative.  Negative for photophobia, discharge, redness, itching and visual disturbance.  Respiratory: Negative for cough, shortness of breath and wheezing.   Cardiovascular: Negative for  chest pain, palpitations and leg swelling.  Gastrointestinal: Negative for abdominal pain, constipation, diarrhea, nausea and vomiting.  Endocrine: Positive for heat intolerance. Negative for cold intolerance.  Genitourinary: Negative for dysuria and flank pain.  Musculoskeletal: Positive for arthralgias, back pain and joint swelling. Negative for gait problem and neck pain.  Skin: Negative for color change and rash.   Allergic/Immunologic: Negative for environmental allergies and food allergies.  Neurological: Negative for dizziness and headaches.  Hematological: Negative for adenopathy. Does not bruise/bleed easily.  Psychiatric/Behavioral: Positive for decreased concentration. Negative for agitation, behavioral problems (depression) and hallucinations. The patient is nervous/anxious.      Today's Vitals   03/08/18 0952  BP: 140/90  Pulse: 66  Resp: 16  SpO2: 98%  Weight: 227 lb (103 kg)  Height: 5\' 7"  (1.702 m)   Physical Exam  Constitutional: She is oriented to person, place, and time. She appears well-developed and well-nourished. No distress.  HENT:  Head: Normocephalic and atraumatic.  Nose: Nose normal.  Mouth/Throat: Oropharynx is clear and moist. No oropharyngeal exudate.  Eyes: Pupils are equal, round, and reactive to light. Conjunctivae and EOM are normal.  Neck: Normal range of motion. Neck supple. No JVD present. No tracheal deviation present. No thyromegaly present.  Cardiovascular: Normal rate, regular rhythm, normal heart sounds and intact distal pulses. Exam reveals no gallop and no friction rub.  No murmur heard. Pulmonary/Chest: Effort normal and breath sounds normal. No respiratory distress. She has no wheezes. She has no rales. She exhibits no tenderness. Right breast exhibits no inverted nipple, no mass, no nipple discharge, no skin change and no tenderness. Left breast exhibits no inverted nipple, no mass, no nipple discharge, no skin change and no tenderness.  Abdominal: Soft. Bowel sounds are normal. There is no tenderness.  Genitourinary: Vagina normal and uterus normal.  Genitourinary Comments: No tenderness, masses, or organomeglay present during bimanual exam .  Musculoskeletal: Normal range of motion.  Lymphadenopathy:    She has no cervical adenopathy.  Neurological: She is alert and oriented to person, place, and time. No cranial nerve deficit.  Skin: Skin is  warm and dry. Capillary refill takes less than 2 seconds. She is not diaphoretic.  Psychiatric: She has a normal mood and affect. Her behavior is normal. Judgment and thought content normal.  Nursing note and vitals reviewed.   Assessment/Plan:  1. Encounter for routine adult health examination with abnormal findings Annual health maintenance exam with pap smear today  2. Essential hypertension, benign Generally stable. Continue HCTZ as prescribed. Refill provided today.  - hydrochlorothiazide (HYDRODIURIL) 25 MG tablet; Take 1 tablet (25 mg total) by mouth daily.  Dispense: 30 tablet; Refill: 5  3. Attention deficit disorder of adult Restart adderall 20mg  twice daily when needed. Three 30 day prescriptions were provided. Dates are 03/08/2018, 04/05/2018, and 05/04/2018 - amphetamine-dextroamphetamine (ADDERALL) 20 MG tablet; TAKE 1 TABLET BY MOUTH TWICE A DAY AS NEEDED FOR FOCUS  Dispense: 60 tablet; Refill: 0  4. Fever blister - valACYclovir (VALTREX) 1000 MG tablet; Take 1 tablet (1,000 mg total) by mouth 2 (two) times daily.  Dispense: 60 tablet; Refill: 3  5. Routine cervical smear - Pap IG and HPV (high risk) DNA detection  6. Screening for breast cancer - MM DIGITAL SCREENING BILATERAL; Future   General Counseling: Avi verbalizes understanding of the findings of todays visit and agrees with plan of treatment. I have discussed any further diagnostic evaluation that may be needed or ordered today. We also reviewed her  medications today. she has been encouraged to call the office with any questions or concerns that should arise related to todays visit.    Counseling:  Hypertension Counseling:   The following hypertensive lifestyle modification were recommended and discussed:  1. Limiting alcohol intake to less than 1 oz/day of ethanol:(24 oz of beer or 8 oz of wine or 2 oz of 100-proof whiskey). 2. Take baby ASA 81 mg daily. 3. Importance of regular aerobic exercise and  losing weight. 4. Reduce dietary saturated fat and cholesterol intake for overall cardiovascular health. 5. Maintaining adequate dietary potassium, calcium, and magnesium intake. 6. Regular monitoring of the blood pressure. 7. Reduce sodium intake to less than 100 mmol/day (less than 2.3 gm of sodium or less than 6 gm of sodium choride)   This patient was seen by [place] with Dr Lavera Guise as a part of collaborative care agreement  Orders Placed This Encounter  Procedures  . MM DIGITAL SCREENING BILATERAL    Meds ordered this encounter  Medications  . DISCONTD: amphetamine-dextroamphetamine (ADDERALL) 20 MG tablet    Sig: TAKE 1 TABLET BY MOUTH TWICE A DAY AS NEEDED FOR FOCUS    Dispense:  60 tablet    Refill:  0    Order Specific Question:   Supervising Provider    Answer:   Lavera Guise [[id number]]  . hydrochlorothiazide (HYDRODIURIL) 25 MG tablet    Sig: Take 1 tablet (25 mg total) by mouth daily.    Dispense:  30 tablet    Refill:  5    Order Specific Question:   Supervising Provider    Answer:   Lavera Guise [[id number]]  . DISCONTD: amphetamine-dextroamphetamine (ADDERALL) 20 MG tablet    Sig: TAKE 1 TABLET BY MOUTH TWICE A DAY AS NEEDED FOR FOCUS    Dispense:  60 tablet    Refill:  0    Fill after 04/05/2018    Order Specific Question:   Supervising Provider    Answer:   Lavera Guise [[id number]]  . amphetamine-dextroamphetamine (ADDERALL) 20 MG tablet    Sig: TAKE 1 TABLET BY MOUTH TWICE A DAY AS NEEDED FOR FOCUS    Dispense:  60 tablet    Refill:  0    Fill after 05/04/2018    Order Specific Question:   Supervising Provider    Answer:   Lavera Guise [place]  . valACYclovir (VALTREX) 1000 MG tablet    Sig: Take 1 tablet (1,000 mg total) by mouth 2 (two) times daily.    Dispense:  60 tablet    Refill:  3    Patient will pick up prescription today.    Order Specific Question:   Supervising Provider    Answer:   Lavera Guise [[id number]]    Time spent: [place], MD  Internal Medicine

## 2018-03-10 LAB — PAP IG AND HPV HIGH-RISK
HPV, high-risk: NEGATIVE
PAP Smear Comment: 0

## 2018-03-12 ENCOUNTER — Telehealth: Payer: Self-pay

## 2018-03-12 NOTE — Telephone Encounter (Signed)
Informed pt of her pap results

## 2018-03-17 ENCOUNTER — Other Ambulatory Visit: Payer: Self-pay | Admitting: Internal Medicine

## 2018-03-17 ENCOUNTER — Other Ambulatory Visit: Payer: Self-pay | Admitting: Nurse Practitioner

## 2018-03-17 DIAGNOSIS — M545 Low back pain, unspecified: Secondary | ICD-10-CM

## 2018-03-17 DIAGNOSIS — G8929 Other chronic pain: Secondary | ICD-10-CM

## 2018-03-17 MED ORDER — TRAMADOL HCL 50 MG PO TABS: ORAL_TABLET | ORAL | 2 refills | Status: DC

## 2018-03-17 NOTE — Progress Notes (Signed)
Renewed patient's rx for tramadol per pharmacy request. #60 with 2 refills.

## 2018-03-17 NOTE — Telephone Encounter (Signed)
Can you filled this pt was seen 03/08/18

## 2018-03-17 NOTE — Telephone Encounter (Signed)
Renewed patient's rx for tramadol per pharmacy request. #60 with 2 refills.

## 2018-04-30 DIAGNOSIS — M179 Osteoarthritis of knee, unspecified: Secondary | ICD-10-CM | POA: Diagnosis not present

## 2018-04-30 DIAGNOSIS — M7052 Other bursitis of knee, left knee: Secondary | ICD-10-CM | POA: Diagnosis not present

## 2018-05-05 DIAGNOSIS — M7052 Other bursitis of knee, left knee: Secondary | ICD-10-CM | POA: Diagnosis not present

## 2018-06-11 ENCOUNTER — Ambulatory Visit: Payer: Self-pay | Admitting: Nurse Practitioner

## 2018-06-15 ENCOUNTER — Encounter: Payer: Self-pay | Admitting: Nurse Practitioner

## 2018-06-15 ENCOUNTER — Ambulatory Visit: Payer: BLUE CROSS/BLUE SHIELD | Admitting: Nurse Practitioner

## 2018-06-15 VITALS — BP 138/84 | HR 76 | Resp 16 | Ht 67.0 in | Wt 234.2 lb

## 2018-06-15 DIAGNOSIS — F988 Other specified behavioral and emotional disorders with onset usually occurring in childhood and adolescence: Secondary | ICD-10-CM | POA: Diagnosis not present

## 2018-06-15 DIAGNOSIS — I10 Essential (primary) hypertension: Secondary | ICD-10-CM | POA: Diagnosis not present

## 2018-06-15 DIAGNOSIS — B373 Candidiasis of vulva and vagina: Secondary | ICD-10-CM

## 2018-06-15 DIAGNOSIS — B3731 Acute candidiasis of vulva and vagina: Secondary | ICD-10-CM | POA: Insufficient documentation

## 2018-06-15 MED ORDER — AMPHETAMINE-DEXTROAMPHETAMINE 20 MG PO TABS: ORAL_TABLET | ORAL | 0 refills | Status: DC

## 2018-06-15 MED ORDER — FLUCONAZOLE 150 MG PO TABS: 150.0000 mg | ORAL_TABLET | ORAL | 5 refills | Status: DC

## 2018-06-15 NOTE — Progress Notes (Signed)
[facility] [place], [place] [address]  Internal MEDICINE  Office Visit Note  Patient Name: Audrey Ball  [id number]  [id number]  Date of Service: 06/15/2018  Chief Complaint  Patient presents with  . Medical Management of Chronic Issues    3 month follow up, medication refill    The patient is c/o problems with focus and concentration at work. 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 Is taking Adderall 20mg  twice daily while at work. Keeps her focused and on track. She reports no negative side effects from taking this medication. She needs to have refills today.  Chronic yeast infection also reported. Does well while taking diflucan weekly. Has been without infection for several months. Would like to continue treatment. Needs to have refills of this.  [place], fasting labs ordered for her in 08/2017. She states she will get these done at the beginning of the new year. Still has lab slip which was given to her.       Current Medication: Outpatient Encounter Medications as of 06/15/2018  Medication Sig  . amitriptyline (ELAVIL) 100 MG tablet TAKE 1 TABLET (100 MG TOTAL) BY MOUTH NIGHTLY.  Marland Kitchen amphetamine-dextroamphetamine (ADDERALL) 20 MG tablet TAKE 1 TABLET BY MOUTH TWICE A DAY AS NEEDED FOR FOCUS  . betamethasone valerate ointment (VALISONE) 0.1 % Apply 1 application topically 2 (two) times daily.  . ferrous sulfate 325 (65 FE) MG EC tablet Take by mouth.  . fluconazole (DIFLUCAN) 150 MG tablet Take 1 tablet (150 mg total) by mouth once a week.  . hydrochlorothiazide (HYDRODIURIL) 25 MG tablet Take 1 tablet (25 mg total) by mouth daily.  . traMADol (ULTRAM) 50 MG tablet One tab po bid prn  . valACYclovir (VALTREX) 1000 MG tablet Take 1 tablet (1,000 mg total) by mouth 2 (two) times daily.  . [DISCONTINUED] amphetamine-dextroamphetamine (ADDERALL) 20 MG  tablet TAKE 1 TABLET BY MOUTH TWICE A DAY AS NEEDED FOR FOCUS  . [DISCONTINUED] amphetamine-dextroamphetamine (ADDERALL) 20 MG tablet TAKE 1 TABLET BY MOUTH TWICE A DAY AS NEEDED FOR FOCUS  . [DISCONTINUED] amphetamine-dextroamphetamine (ADDERALL) 20 MG tablet TAKE 1 TABLET BY MOUTH TWICE A DAY AS NEEDED FOR FOCUS  . [DISCONTINUED] fluconazole (DIFLUCAN) 150 MG tablet TAKE 1 TABLET(S) BY MOUTH ONE TIME WEEKLY FOR YEAST INFECTION   No facility-administered encounter medications on file as of 06/15/2018.     Surgical History: Past Surgical History:  Procedure Laterality Date  . BACK SURGERY  2017  . GALLBLADDER SURGERY  1999  . PARTIAL HYSTERECTOMY  2016    Medical History: Past Medical History:  Diagnosis Date  . Hypertension     Family History: Family History  Problem Relation Age of Onset  . Leukemia Mother   . Hypertension Mother   . Diabetes Mother   . Lung cancer Father     Social History   Socioeconomic History  . Marital status: Single    Spouse name: Not on file  . Number of children: Not on file  . Years of education: Not on file  . Highest education level: Not on file  Occupational History  . Not on file  Social Needs  . Financial resource strain: Not on file  . Food insecurity:    Worry: Not on file    Inability: Not on file  . Transportation needs:    Medical: Not on file  Non-medical: Not on file  Tobacco Use  . Smoking status: Never Smoker  . Smokeless tobacco: Never Used  Substance and Sexual Activity  . Alcohol use: No    Frequency: Never  . Drug use: No  . Sexual activity: Not on file  Lifestyle  . Physical activity:    Days per week: Not on file    Minutes per session: Not on file  . Stress: Not on file  Relationships  . Social connections:    Talks on phone: Not on file    Gets together: Not on file    Attends religious service: Not on file    Active member of club or organization: Not on file    Attends meetings of clubs or  organizations: Not on file    Relationship status: Not on file  . Intimate partner violence:    Fear of current or ex partner: Not on file    Emotionally abused: Not on file    Physically abused: Not on file    Forced sexual activity: Not on file  Other Topics Concern  . Not on file  Social History Narrative  . Not on file      Review of Systems  Constitutional: Negative for chills, diaphoresis, fatigue and unexpected weight change.  HENT: Negative for ear pain, postnasal drip and sinus pressure.   Eyes: Negative.  Negative for photophobia, discharge, redness, itching and visual disturbance.  Respiratory: Negative for cough, shortness of breath and wheezing.   Cardiovascular: Negative for chest pain, palpitations and leg swelling.  Gastrointestinal: Negative for abdominal pain, constipation, diarrhea, nausea and vomiting.  Endocrine: Positive for heat intolerance. Negative for cold intolerance.  Genitourinary: Positive for vaginal discharge. Negative for dysuria and flank pain.       Chronic vaginal yeast infection.   Musculoskeletal: Positive for arthralgias, back pain and joint swelling. Negative for gait problem and neck pain.       Bilateral knee pain which is controlled with current medications.   Skin: Negative for color change and rash.  Allergic/Immunologic: Negative for environmental allergies and food allergies.  Neurological: Negative for dizziness and headaches.  Hematological: Negative for adenopathy. Does not bruise/bleed easily.  Psychiatric/Behavioral: Positive for decreased concentration. Negative for agitation, behavioral problems (depression) and hallucinations. The patient is nervous/anxious.        Doing better on adderall twice daily.     Today's Vitals   06/15/18 0937  BP: 138/84  Pulse: 76  Resp: 16  SpO2: 99%  Weight: 234 lb 3.2 oz (106.2 kg)  Height: 5\' 7"  (1.702 m)  Body mass index is 36.68 kg/m.  Physical Exam Vitals signs and nursing note  reviewed.  Constitutional:      General: She is not in acute distress.    Appearance: Normal appearance. She is well-developed. She is not diaphoretic.  HENT:     Head: Normocephalic and atraumatic.     Mouth/Throat:     Pharynx: No oropharyngeal exudate.  Eyes:     Extraocular Movements: Extraocular movements intact.     Pupils: Pupils are equal, round, and reactive to light.  Neck:     Musculoskeletal: Normal range of motion and neck supple.     Thyroid: No thyromegaly.     Vascular: No JVD.     Trachea: No tracheal deviation.  Cardiovascular:     Rate and Rhythm: Normal rate and regular rhythm.     Heart sounds: Normal heart sounds. No murmur. No friction rub. No gallop.  Pulmonary:     Effort: Pulmonary effort is normal. No respiratory distress.     Breath sounds: Normal breath sounds. No wheezing or rales.  Chest:     Chest wall: No tenderness.  Musculoskeletal: Normal range of motion.  Lymphadenopathy:     Cervical: No cervical adenopathy.  Skin:    General: Skin is warm and dry.  Neurological:     General: No focal deficit present.     Mental Status: She is alert and oriented to person, place, and time.     Cranial Nerves: No cranial nerve deficit.  Psychiatric:        Behavior: Behavior normal.        Thought Content: Thought content normal.        Judgment: Judgment normal.   Assessment/Plan: 1. Essential hypertension, benign Stable. Continue bp medication as prescribed   2. Vaginal candidiasis Continue diflucan 150mg  weekly to prevent yeast infection.  - fluconazole (DIFLUCAN) 150 MG tablet; Take 1 tablet (150 mg total) by mouth once a week.  Dispense: 4 tablet; Refill: 5  3. Attention deficit disorder of adult Ok to continue adderall 20mg  twice daily as needed for focus and concentration. Three 30 day prescriptions were sent to her pharmacy. Dates are 06/15/2018, 07/14/2018, and 08/10/2018 - amphetamine-dextroamphetamine (ADDERALL) 20 MG tablet; TAKE 1 TABLET  BY MOUTH TWICE A DAY AS NEEDED FOR FOCUS  Dispense: 60 tablet; Refill: 0  General Counseling: Eri verbalizes understanding of the findings of todays visit and agrees with plan of treatment. I have discussed any further diagnostic evaluation that may be needed or ordered today. We also reviewed her medications today. she has been encouraged to call the office with any questions or concerns that should arise related to todays visit.  Refilled Controlled medications today. Reviewed risks and possible side effects associated with taking Stimulants. Combination of these drugs with other psychotropic medications could cause dizziness and drowsiness. Pt needs to Monitor symptoms and exercise caution in driving and operating heavy machinery to avoid damages to oneself, to others and to the surroundings. Patient verbalized understanding in this matter. Dependence and abuse for these drugs will be monitored closely. A Controlled substance policy and procedure is on file which allows [facility] to order a urine drug screen test at any visit. Patient understands and agrees with the plan..  This patient was seen by Leretha Pol FNP Collaboration with Dr Lavera Guise as a part of collaborative care agreement  Meds ordered this encounter  Medications  . DISCONTD: amphetamine-dextroamphetamine (ADDERALL) 20 MG tablet    Sig: TAKE 1 TABLET BY MOUTH TWICE A DAY AS NEEDED FOR FOCUS    Dispense:  60 tablet    Refill:  0    Order Specific Question:   Supervising Provider    Answer:   Lavera Guise [[id number]]  . fluconazole (DIFLUCAN) 150 MG tablet    Sig: Take 1 tablet (150 mg total) by mouth once a week.    Dispense:  4 tablet    Refill:  5    Order Specific Question:   Supervising Provider    Answer:   Lavera Guise [[id number]]  . DISCONTD: amphetamine-dextroamphetamine (ADDERALL) 20 MG tablet    Sig: TAKE 1 TABLET BY MOUTH TWICE A DAY AS NEEDED FOR FOCUS    Dispense:  60 tablet    Refill:  0    Fill  after 07/14/2018    Order Specific Question:   Supervising Provider  Answer:   Lavera Guise [place]  . amphetamine-dextroamphetamine (ADDERALL) 20 MG tablet    Sig: TAKE 1 TABLET BY MOUTH TWICE A DAY AS NEEDED FOR FOCUS    Dispense:  60 tablet    Refill:  0    Fill after 08/12/2018    Order Specific Question:   Supervising Provider    Answer:   Lavera Guise [[id number]]    Time spent: 25 Minutes      Dr Lavera Guise Internal medicine

## 2018-07-12 DIAGNOSIS — M7052 Other bursitis of knee, left knee: Secondary | ICD-10-CM | POA: Diagnosis not present

## 2018-09-07 ENCOUNTER — Encounter: Payer: Self-pay | Admitting: Adult Health

## 2018-09-07 ENCOUNTER — Ambulatory Visit: Payer: BC Managed Care – PPO | Admitting: Adult Health

## 2018-09-07 VITALS — BP 132/76 | HR 70 | Temp 98.2°F | Resp 16 | Ht 67.0 in | Wt 228.0 lb

## 2018-09-07 DIAGNOSIS — B373 Candidiasis of vulva and vagina: Secondary | ICD-10-CM

## 2018-09-07 DIAGNOSIS — I10 Essential (primary) hypertension: Secondary | ICD-10-CM | POA: Diagnosis not present

## 2018-09-07 DIAGNOSIS — B3731 Acute candidiasis of vulva and vagina: Secondary | ICD-10-CM

## 2018-09-07 DIAGNOSIS — J0110 Acute frontal sinusitis, unspecified: Secondary | ICD-10-CM | POA: Diagnosis not present

## 2018-09-07 MED ORDER — SULFAMETHOXAZOLE-TRIMETHOPRIM 800-160 MG PO TABS: 1.0000 | ORAL_TABLET | Freq: Two times a day (BID) | ORAL | 0 refills | Status: DC

## 2018-09-07 MED ORDER — FLUCONAZOLE 150 MG PO TABS: 150.0000 mg | ORAL_TABLET | ORAL | 1 refills | Status: DC | PRN

## 2018-09-07 NOTE — Progress Notes (Signed)
[facility] [place], [place] [address]  Internal MEDICINE  Office Visit Note  Patient Name: Audrey Ball  [id number]  [id number]  Date of Service: 09/07/2018  Chief Complaint  Patient presents with  . Sinusitis    5 days now , using allegra d and tylenol   . Headache     HPI Pt is here for a sick visit. Pt reports 5 days she has had sinus pressure, and headache.  She denies fever/chills, coughing or sore throat. She has been taking allegra D and Tylenol Sinus over the last few days with mild relief.       Current Medication:  Outpatient Encounter Medications as of 09/07/2018  Medication Sig  . amitriptyline (ELAVIL) 100 MG tablet TAKE 1 TABLET (100 MG TOTAL) BY MOUTH NIGHTLY.  Marland Kitchen amphetamine-dextroamphetamine (ADDERALL) 20 MG tablet TAKE 1 TABLET BY MOUTH TWICE A DAY AS NEEDED FOR FOCUS  . betamethasone valerate ointment (VALISONE) 0.1 % Apply 1 application topically 2 (two) times daily.  . ferrous sulfate 325 (65 FE) MG EC tablet Take by mouth.  . hydrochlorothiazide (HYDRODIURIL) 25 MG tablet Take 1 tablet (25 mg total) by mouth daily.  . traMADol (ULTRAM) 50 MG tablet One tab po bid prn  . valACYclovir (VALTREX) 1000 MG tablet Take 1 tablet (1,000 mg total) by mouth 2 (two) times daily.  . [DISCONTINUED] fluconazole (DIFLUCAN) 150 MG tablet Take 1 tablet (150 mg total) by mouth once a week. (Patient not taking: Reported on 09/07/2018)   No facility-administered encounter medications on file as of 09/07/2018.       Medical History: Past Medical History:  Diagnosis Date  . Hypertension      Vital Signs: BP 132/76   Pulse 70   Temp 98.2 F (36.8 C)   Resp 16   Ht 5\' 7"  (1.702 m)   Wt 228 lb (103.4 kg)   SpO2 96%   BMI 35.71 kg/m    Review of Systems  Constitutional: Negative for chills, fatigue and unexpected weight change.  HENT: Positive for sinus pressure and sinus pain. Negative for congestion, rhinorrhea, sneezing and  sore throat.   Eyes: Negative for photophobia, pain and redness.  Respiratory: Negative for cough, chest tightness and shortness of breath.   Cardiovascular: Negative for chest pain and palpitations.  Gastrointestinal: Negative for abdominal pain, constipation, diarrhea, nausea and vomiting.  Endocrine: Negative.   Genitourinary: Negative for dysuria and frequency.  Musculoskeletal: Negative for arthralgias, back pain, joint swelling and neck pain.  Skin: Negative for rash.  Allergic/Immunologic: Negative.   Neurological: Negative for tremors and numbness.  Hematological: Negative for adenopathy. Does not bruise/bleed easily.  Psychiatric/Behavioral: Negative for behavioral problems and sleep disturbance. The patient is not nervous/anxious.     Physical Exam Vitals signs and nursing note reviewed.  Constitutional:      General: She is not in acute distress.    Appearance: She is well-developed. She is not diaphoretic.  HENT:     Head: Normocephalic and atraumatic.     Mouth/Throat:     Pharynx: No oropharyngeal exudate.  Eyes:     Pupils: Pupils are equal, round, and reactive to light.  Neck:     Musculoskeletal: Normal range of motion and neck supple.     Thyroid: No thyromegaly.     Vascular: No JVD.     Trachea: No tracheal deviation.  Cardiovascular:     Rate and Rhythm: Normal rate and regular rhythm.  Heart sounds: Normal heart sounds. No murmur. No friction rub. No gallop.   Pulmonary:     Effort: Pulmonary effort is normal. No respiratory distress.     Breath sounds: Normal breath sounds. No wheezing or rales.  Chest:     Chest wall: No tenderness.  Abdominal:     Palpations: Abdomen is soft.     Tenderness: There is no abdominal tenderness. There is no guarding.  Musculoskeletal: Normal range of motion.  Lymphadenopathy:     Cervical: No cervical adenopathy.  Skin:    General: Skin is warm and dry.  Neurological:     Mental Status: She is alert and oriented  to person, place, and time.     Cranial Nerves: No cranial nerve deficit.  Psychiatric:        Behavior: Behavior normal.        Thought Content: Thought content normal.        Judgment: Judgment normal.    Assessment/Plan: 1. Acute non-recurrent frontal sinusitis Advised patient to take entire course of antibiotics as prescribed with food. Pt should return to clinic in 7-10 days if symptoms fail to improve or new symptoms develop.  - sulfamethoxazole-trimethoprim (BACTRIM DS,SEPTRA DS) 800-160 MG tablet; Take 1 tablet by mouth 2 (two) times daily.  Dispense: 20 tablet; Refill: 0  2. Vaginal candida PT prescribed Diflucan for vaginal yeast, with antibiotic use.  - fluconazole (DIFLUCAN) 150 MG tablet; Take 1 tablet (150 mg total) by mouth every three (3) days as needed.  Dispense: 3 tablet; Refill: 1  3. Essential hypertension, benign Well controlled today. 132/76.  Continue present management.   General Counseling: [place] verbalizes understanding of the findings of todays visit and agrees with plan of treatment. I have discussed any further diagnostic evaluation that may be needed or ordered today. We also reviewed her medications today. she has been encouraged to call the office with any questions or concerns that should arise related to todays visit.   No orders of the defined types were placed in this encounter.   No orders of the defined types were placed in this encounter.   Time spent: 25 Minutes  This patient was seen by Orson Gear AGNP-C in Collaboration with Dr Lavera Guise as a part of collaborative care agreement.  Kendell Bane AGNP-C Internal Medicine

## 2018-09-07 NOTE — Patient Instructions (Signed)

## 2018-09-21 ENCOUNTER — Other Ambulatory Visit: Payer: Self-pay

## 2018-09-21 DIAGNOSIS — B001 Herpesviral vesicular dermatitis: Secondary | ICD-10-CM

## 2018-09-21 MED ORDER — VALACYCLOVIR HCL 1 G PO TABS: 1000.0000 mg | ORAL_TABLET | Freq: Two times a day (BID) | ORAL | 3 refills | Status: DC

## 2018-09-27 ENCOUNTER — Ambulatory Visit: Payer: BC Managed Care – PPO | Admitting: Internal Medicine

## 2018-09-27 ENCOUNTER — Encounter: Payer: Self-pay | Admitting: Internal Medicine

## 2018-09-27 ENCOUNTER — Other Ambulatory Visit: Payer: Self-pay

## 2018-09-27 VITALS — BP 142/98 | HR 55 | Temp 98.1°F | Resp 16 | Ht 67.0 in | Wt 232.0 lb

## 2018-09-27 DIAGNOSIS — G8929 Other chronic pain: Secondary | ICD-10-CM

## 2018-09-27 DIAGNOSIS — M545 Low back pain, unspecified: Secondary | ICD-10-CM

## 2018-09-27 DIAGNOSIS — Z0001 Encounter for general adult medical examination with abnormal findings: Secondary | ICD-10-CM

## 2018-09-27 DIAGNOSIS — R300 Dysuria: Secondary | ICD-10-CM

## 2018-09-27 DIAGNOSIS — N760 Acute vaginitis: Secondary | ICD-10-CM

## 2018-09-27 DIAGNOSIS — N3001 Acute cystitis with hematuria: Secondary | ICD-10-CM | POA: Diagnosis not present

## 2018-09-27 DIAGNOSIS — B009 Herpesviral infection, unspecified: Secondary | ICD-10-CM | POA: Diagnosis not present

## 2018-09-27 DIAGNOSIS — N369 Urethral disorder, unspecified: Secondary | ICD-10-CM

## 2018-09-27 LAB — POCT URINALYSIS DIPSTICK
Bilirubin, UA: NEGATIVE
Glucose, UA: NEGATIVE
Ketones, UA: NEGATIVE
Leukocytes, UA: NEGATIVE
Nitrite, UA: NEGATIVE
Protein, UA: NEGATIVE
Spec Grav, UA: 1.015 (ref 1.010–1.025)
Urobilinogen, UA: 0.2 E.U./dL
pH, UA: 7.5 (ref 5.0–8.0)

## 2018-09-27 MED ORDER — TRAMADOL HCL 50 MG PO TABS: ORAL_TABLET | ORAL | 1 refills | Status: DC

## 2018-09-27 MED ORDER — FLUCONAZOLE 150 MG PO TABS: ORAL_TABLET | ORAL | 0 refills | Status: DC

## 2018-09-27 NOTE — Progress Notes (Signed)
[facility] [place], [place] [address]  Internal MEDICINE  Office Visit Note  Patient Name: Audrey Ball  [id number]  [id number]  Date of Service: 10/03/2018  Chief Complaint  Patient presents with  . Vaginitis    swollen in vaginal area. pt bp elevated and pulse is low     HPI  Pt is c/o vaginal itching and burning, She did try cream otc with no improvement, Pt has taken antibiotics in the past 2 weeks. BP seems to be elevated, she does have flare up for herpes off and on. No fever or chills. She does have some swelling of her urthera, denies any pain, sexually active with unprotected sex  Current Medication: Outpatient Encounter Medications as of 09/27/2018  Medication Sig  . amphetamine-dextroamphetamine (ADDERALL) 20 MG tablet TAKE 1 TABLET BY MOUTH TWICE A DAY AS NEEDED FOR FOCUS  . betamethasone valerate ointment (VALISONE) 0.1 % Apply 1 application topically 2 (two) times daily.  . ferrous sulfate 325 (65 FE) MG EC tablet Take by mouth.  . hydrochlorothiazide (HYDRODIURIL) 25 MG tablet Take 1 tablet (25 mg total) by mouth daily.  . traMADol (ULTRAM) 50 MG tablet Take one tab po qd prn for pain  . valACYclovir (VALTREX) 1000 MG tablet Take 1 tablet (1,000 mg total) by mouth 2 (two) times daily.  . [DISCONTINUED] amitriptyline (ELAVIL) 100 MG tablet TAKE 1 TABLET (100 MG TOTAL) BY MOUTH NIGHTLY.  . [DISCONTINUED] traMADol (ULTRAM) 50 MG tablet One tab po bid prn  . fluconazole (DIFLUCAN) 150 MG tablet Take one tab po qd x 5 days and then once week there after for 5 weeks  . [DISCONTINUED] fluconazole (DIFLUCAN) 150 MG tablet Take 1 tablet (150 mg total) by mouth every three (3) days as needed. (Patient not taking: Reported on 09/27/2018)  . [DISCONTINUED] sulfamethoxazole-trimethoprim (BACTRIM DS,SEPTRA DS) 800-160 MG tablet Take 1 tablet by mouth 2 (two) times daily. (Patient not taking: Reported on 09/27/2018)   No facility-administered encounter  medications on file as of 09/27/2018.     Surgical History: Past Surgical History:  Procedure Laterality Date  . BACK SURGERY  2017  . GALLBLADDER SURGERY  1999  . PARTIAL HYSTERECTOMY  2016    Medical History: Past Medical History:  Diagnosis Date  . Hypertension     Family History: Family History  Problem Relation Age of Onset  . Leukemia Mother   . Hypertension Mother   . Diabetes Mother   . Lung cancer Father     Social History   Socioeconomic History  . Marital status: Single    Spouse name: Not on file  . Number of children: Not on file  . Years of education: Not on file  . Highest education level: Not on file  Occupational History  . Not on file  Social Needs  . Financial resource strain: Not on file  . Food insecurity:    Worry: Not on file    Inability: Not on file  . Transportation needs:    Medical: Not on file    Non-medical: Not on file  Tobacco Use  . Smoking status: Never Smoker  . Smokeless tobacco: Never Used  Substance and Sexual Activity  . Alcohol use: No    Frequency: Never  . Drug use: No  . Sexual activity: Not on file  Lifestyle  . Physical activity:    Days per week: Not on file    Minutes per session: Not on file  . Stress:  Not on file  Relationships  . Social connections:    Talks on phone: Not on file    Gets together: Not on file    Attends religious service: Not on file    Active member of club or organization: Not on file    Attends meetings of clubs or organizations: Not on file    Relationship status: Not on file  . Intimate partner violence:    Fear of current or ex partner: Not on file    Emotionally abused: Not on file    Physically abused: Not on file    Forced sexual activity: Not on file  Other Topics Concern  . Not on file  Social History Narrative  . Not on file    Review of Systems  Constitutional: Negative for chills, diaphoresis and fatigue.  HENT: Negative for ear pain, postnasal drip and sinus  pressure.   Eyes: Negative for photophobia, discharge, redness, itching and visual disturbance.  Respiratory: Negative for cough, shortness of breath and wheezing.   Cardiovascular: Negative for chest pain, palpitations and leg swelling.  Gastrointestinal: Negative for abdominal pain, constipation, diarrhea, nausea and vomiting.  Genitourinary: Positive for genital sores. Negative for dysuria and flank pain.       Vaginal itching   Musculoskeletal: Negative for arthralgias, back pain, gait problem and neck pain.  Skin: Negative for color change.  Allergic/Immunologic: Negative for environmental allergies and food allergies.  Neurological: Negative for dizziness and headaches.  Hematological: Does not bruise/bleed easily.  Psychiatric/Behavioral: Negative for agitation, behavioral problems (depression) and hallucinations.    Vital Signs: BP (!) 142/98   Pulse (!) 55   Temp 98.1 F (36.7 C)   Resp 16   Ht 5\' 7"  (1.702 m)   Wt 232 lb (105.2 kg)   SpO2 97%   BMI 36.34 kg/m    Physical Exam Constitutional:      Appearance: Normal appearance.  Cardiovascular:     Rate and Rhythm: Normal rate.  Pulmonary:     Effort: Pulmonary effort is normal.  Genitourinary:    General: Normal vulva.     Comments: Urethral  swelling is present  Neurological:     Mental Status: She is alert.    Assessment/Plan: 1. Dysuria - POCT Urinalysis Dipstick - CULTURE, URINE COMPREHENSIVE  2. Herpes simplex infection - Continue suppressive therapy with antiviral medication  3. Chronic bilateral low back pain without sciatica - traMADol (ULTRAM) 50 MG tablet; Take one tab po qd prn for pain  Dispense: 30 tablet; Refill: 1 - CBC with Differential/Platelet; Future  4. 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 I have discussed any further diagnostic evaluation that may be needed or ordered today. We also reviewed her medications today. she has been encouraged to call the office with any questions or concerns that should arise related to todays visit.    Orders Placed This Encounter  Procedures  . CULTURE, URINE COMPREHENSIVE  . FSH/LH  . CBC with Differential/Platelet  . Lipid Panel With LDL/HDL Ratio  . TSH  . T4, free  . Comprehensive metabolic panel  . NuSwab Vaginitis  Plus (VG+)  . POCT Urinalysis Dipstick    Meds ordered this encounter  Medications  . traMADol (ULTRAM) 50 MG tablet    Sig: Take one tab po qd prn for pain    Dispense:  30 tablet    Refill:  1  . fluconazole (DIFLUCAN) 150 MG tablet    Sig: Take one tab po qd x 5 days and then once week there after for 5 weeks    Dispense:  10 tablet    Refill:  0    Time spent:25 Minutes      Dr Lavera Guise Internal medicine

## 2018-09-29 LAB — CULTURE, URINE COMPREHENSIVE

## 2018-10-01 LAB — NUSWAB VAGINITIS PLUS (VG+)
Candida albicans, NAA: NEGATIVE
Candida glabrata, NAA: NEGATIVE
Chlamydia trachomatis, NAA: NEGATIVE
Neisseria gonorrhoeae, NAA: NEGATIVE
Trich vag by NAA: NEGATIVE

## 2018-10-04 ENCOUNTER — Ambulatory Visit: Payer: Self-pay | Admitting: Nurse Practitioner

## 2018-10-05 ENCOUNTER — Telehealth: Payer: Self-pay | Admitting: Internal Medicine

## 2018-10-05 ENCOUNTER — Telehealth: Payer: Self-pay | Admitting: Nurse Practitioner

## 2018-10-05 DIAGNOSIS — N369 Urethral disorder, unspecified: Secondary | ICD-10-CM

## 2018-10-05 NOTE — Telephone Encounter (Signed)
Lm for patient to call back in regards to her urine culture , want to see how patient is doing , and possible referral to urology , if symptoms have not resolved, remind patient to go have labs done

## 2018-10-05 NOTE — Telephone Encounter (Signed)
Refer to urology.  ?

## 2018-10-07 DIAGNOSIS — E756 Lipid storage disorder, unspecified: Secondary | ICD-10-CM | POA: Diagnosis not present

## 2018-10-07 DIAGNOSIS — R300 Dysuria: Secondary | ICD-10-CM | POA: Diagnosis not present

## 2018-10-07 DIAGNOSIS — N3001 Acute cystitis with hematuria: Secondary | ICD-10-CM | POA: Diagnosis not present

## 2018-10-07 DIAGNOSIS — G8929 Other chronic pain: Secondary | ICD-10-CM | POA: Diagnosis not present

## 2018-10-07 DIAGNOSIS — M545 Low back pain: Secondary | ICD-10-CM | POA: Diagnosis not present

## 2018-10-08 LAB — LIPID PANEL WITH LDL/HDL RATIO
Cholesterol, Total: 203 mg/dL — ABNORMAL HIGH (ref 100–199)
HDL: 51 mg/dL (ref >39)
LDL Calculated: 126 mg/dL — ABNORMAL HIGH (ref 0–99)
LDl/HDL Ratio: 2.5 ratio (ref 0.0–3.2)
Triglycerides: 129 mg/dL (ref 0–149)
VLDL Cholesterol Cal: 26 mg/dL (ref 5–40)

## 2018-10-08 LAB — CBC WITH DIFFERENTIAL/PLATELET
Basophils Absolute: 0 10*3/uL (ref 0.0–0.2)
Basos: 1 %
EOS (ABSOLUTE): 0.4 10*3/uL (ref 0.0–0.4)
Eos: 8 %
Hematocrit: 40.8 % (ref 34.0–46.6)
Hemoglobin: 14 g/dL (ref 11.1–15.9)
Immature Grans (Abs): 0 10*3/uL (ref 0.0–0.1)
Immature Granulocytes: 0 %
Lymphocytes Absolute: 1.7 10*3/uL (ref 0.7–3.1)
Lymphs: 36 %
MCH: 28.9 pg (ref 26.6–33.0)
MCHC: 34.3 g/dL (ref 31.5–35.7)
MCV: 84 fL (ref 79–97)
Monocytes Absolute: 0.4 10*3/uL (ref 0.1–0.9)
Monocytes: 8 %
Neutrophils Absolute: 2.2 10*3/uL (ref 1.4–7.0)
Neutrophils: 47 %
Platelets: 229 10*3/uL (ref 150–450)
RBC: 4.84 x10E6/uL (ref 3.77–5.28)
RDW: 13.4 % (ref 11.7–15.4)
WBC: 4.7 10*3/uL (ref 3.4–10.8)

## 2018-10-08 LAB — COMPREHENSIVE METABOLIC PANEL
ALT: 21 IU/L (ref 0–32)
AST: 22 IU/L (ref 0–40)
Albumin/Globulin Ratio: 1.4 (ref 1.2–2.2)
Albumin: 4.2 g/dL (ref 3.8–4.8)
Alkaline Phosphatase: 96 IU/L (ref 39–117)
BUN/Creatinine Ratio: 25 — ABNORMAL HIGH (ref 9–23)
BUN: 15 mg/dL (ref 6–24)
Bilirubin Total: 0.4 mg/dL (ref 0.0–1.2)
CO2: 29 mmol/L (ref 20–29)
Calcium: 10 mg/dL (ref 8.7–10.2)
Chloride: 98 mmol/L (ref 96–106)
Creatinine, Ser: 0.59 mg/dL (ref 0.57–1.00)
GFR calc Af Amer: 124 mL/min/{1.73_m2} (ref >59)
GFR calc non Af Amer: 107 mL/min/{1.73_m2} (ref >59)
Globulin, Total: 3 g/dL (ref 1.5–4.5)
Glucose: 87 mg/dL (ref 65–99)
Potassium: 4.6 mmol/L (ref 3.5–5.2)
Sodium: 142 mmol/L (ref 134–144)
Total Protein: 7.2 g/dL (ref 6.0–8.5)

## 2018-10-08 LAB — FSH/LH
FSH: 65.9 m[IU]/mL
LH: 35.9 m[IU]/mL

## 2018-10-11 ENCOUNTER — Ambulatory Visit: Payer: BC Managed Care – PPO | Admitting: Nurse Practitioner

## 2018-10-11 ENCOUNTER — Encounter (INDEPENDENT_AMBULATORY_CARE_PROVIDER_SITE_OTHER): Payer: Self-pay

## 2018-10-11 ENCOUNTER — Encounter: Payer: Self-pay | Admitting: Nurse Practitioner

## 2018-10-11 ENCOUNTER — Other Ambulatory Visit: Payer: Self-pay

## 2018-10-11 VITALS — BP 140/80 | HR 66 | Resp 16 | Ht 67.0 in | Wt 235.6 lb

## 2018-10-11 DIAGNOSIS — B373 Candidiasis of vulva and vagina: Secondary | ICD-10-CM

## 2018-10-11 DIAGNOSIS — B3731 Acute candidiasis of vulva and vagina: Secondary | ICD-10-CM

## 2018-10-11 DIAGNOSIS — I10 Essential (primary) hypertension: Secondary | ICD-10-CM | POA: Diagnosis not present

## 2018-10-11 DIAGNOSIS — N369 Urethral disorder, unspecified: Secondary | ICD-10-CM | POA: Diagnosis not present

## 2018-10-11 MED ORDER — FLUCONAZOLE 150 MG PO TABS: ORAL_TABLET | ORAL | 2 refills | Status: DC

## 2018-10-11 NOTE — Progress Notes (Signed)
Pt is menopausal, will like to discuss all labs on next visit, make sure she gets in with urology

## 2018-10-11 NOTE — Progress Notes (Signed)
[facility] [place], [place] [address]  Internal MEDICINE  Office Visit Note  Patient Name: Audrey Ball  [id number]  [id number]  Date of Service: 10/12/2018  Chief Complaint  Patient presents with  . Medical Management of Chronic Issues    2 weeks follow for yeast infections and labs     The patient is here for follow up f lab work. She states that she is fatigued and often feels stiff in the joints. She has chronic yeast infection. Has been taking weekly diflucan to prevent these infections. Has been doing well. Vaginal swab from recnt visit was negative for evidence of yeast infection or other bacterial vaginitis. She does have a palpable knot above the urethra which is not painful. She has new patient appointment, upcoming with urologist. She did have routine, fasting labs done since she was last seen. Her LDL and total cholesterol are mildly elevated. Discussed importance of following a prudent diet and increasing exercise to lower cholesterol levels.       Current Medication: Outpatient Encounter Medications as of 10/11/2018  Medication Sig  . amphetamine-dextroamphetamine (ADDERALL) 20 MG tablet TAKE 1 TABLET BY MOUTH TWICE A DAY AS NEEDED FOR FOCUS  . betamethasone valerate ointment (VALISONE) 0.1 % Apply 1 application topically 2 (two) times daily.  . ferrous sulfate 325 (65 FE) MG EC tablet Take by mouth.  . fluconazole (DIFLUCAN) 150 MG tablet Take 1 tablet po Q week to prevent yeast infection  . hydrochlorothiazide (HYDRODIURIL) 25 MG tablet Take 1 tablet (25 mg total) by mouth daily.  . traMADol (ULTRAM) 50 MG tablet Take one tab po qd prn for pain  . valACYclovir (VALTREX) 1000 MG tablet Take 1 tablet (1,000 mg total) by mouth 2 (two) times daily.  . [DISCONTINUED] fluconazole (DIFLUCAN) 150 MG tablet Take one tab po qd x 5 days and then once week there after for 5 weeks   No facility-administered encounter medications on file as of  10/11/2018.     Surgical History: Past Surgical History:  Procedure Laterality Date  . BACK SURGERY  2017  . GALLBLADDER SURGERY  1999  . PARTIAL HYSTERECTOMY  2016    Medical History: Past Medical History:  Diagnosis Date  . Hypertension     Family History: Family History  Problem Relation Age of Onset  . Leukemia Mother   . Hypertension Mother   . Diabetes Mother   . Lung cancer Father     Social History   Socioeconomic History  . Marital status: Single    Spouse name: Not on file  . Number of children: Not on file  . Years of education: Not on file  . Highest education level: Not on file  Occupational History  . Not on file  Social Needs  . Financial resource strain: Not on file  . Food insecurity:    Worry: Not on file    Inability: Not on file  . Transportation needs:    Medical: Not on file    Non-medical: Not on file  Tobacco Use  . Smoking status: Never Smoker  . Smokeless tobacco: Never Used  Substance and Sexual Activity  . Alcohol use: No    Frequency: Never  . Drug use: No  . Sexual activity: Not on file  Lifestyle  . Physical activity:    Days per week: Not on file    Minutes per session: Not on file  . Stress: Not on file  Relationships  . Social  connections:    Talks on phone: Not on file    Gets together: Not on file    Attends religious service: Not on file    Active member of club or organization: Not on file    Attends meetings of clubs or organizations: Not on file    Relationship status: Not on file  . Intimate partner violence:    Fear of current or ex partner: Not on file    Emotionally abused: Not on file    Physically abused: Not on file    Forced sexual activity: Not on file  Other Topics Concern  . Not on file  Social History Narrative  . Not on file      Review of Systems  Constitutional: Positive for fatigue. Negative for chills and diaphoresis.  HENT: Negative for ear pain, postnasal drip and sinus pressure.    Respiratory: Negative for cough, shortness of breath and wheezing.   Cardiovascular: Negative for chest pain and palpitations.  Gastrointestinal: Negative for abdominal pain, constipation, diarrhea, nausea and vomiting.  Genitourinary: Positive for genital sores. Negative for dysuria and flank pain.       Vaginal discomfort   Musculoskeletal: Positive for arthralgias and myalgias. Negative for back pain, gait problem and neck pain.  Skin: Negative for color change.  Allergic/Immunologic: Negative for environmental allergies and food allergies.  Neurological: Negative for dizziness and headaches.  Hematological: Does not bruise/bleed easily.  Psychiatric/Behavioral: Negative for agitation, behavioral problems (depression) and hallucinations. The patient is nervous/anxious.     Today's Vitals   10/11/18 1042  BP: 140/80  Pulse: 66  Resp: 16  SpO2: 100%  Weight: 235 lb 9.6 oz (106.9 kg)  Height: 5\' 7"  (1.702 m)   Body mass index is 36.9 kg/m.  Physical Exam Constitutional:      Appearance: Normal appearance.  Cardiovascular:     Rate and Rhythm: Normal rate and regular rhythm.  Pulmonary:     Effort: Pulmonary effort is normal.     Breath sounds: Normal breath sounds.  Neurological:     Mental Status: She is alert and oriented to person, place, and time.    Assessment/Plan: 1. Candidal vaginitis Vaginal samples negative for yeast vaginitis or bacterial infection. Will continue to treat with weekly diflucan to prevent recurrent yeast infections.  - fluconazole (DIFLUCAN) 150 MG tablet; Take 1 tablet po Q week to prevent yeast infection  Dispense: 10 tablet; Refill: 2  2. Urethra disorder Patient has new patient appointment with rology in beginning of June  3. Essential hypertension, benign Stable. Continue bp medication as prescribed   General Counseling: Latria verbalizes understanding of the findings of todays visit and agrees with plan of treatment. I have discussed  any further diagnostic evaluation that may be needed or ordered today. We also reviewed her medications today. she has been encouraged to call the office with any questions or concerns that should arise related to todays visit.  This patient was seen by [place] with Dr Lavera Guise as a part of collaborative care agreement  Meds ordered this encounter  Medications  . fluconazole (DIFLUCAN) 150 MG tablet    Sig: Take 1 tablet po Q week to prevent yeast infection    Dispense:  10 tablet    Refill:  2    Order Specific Question:   Supervising Provider    Answer:   Lavera Guise [[id number]]    Time spent: 14 Minutes      Dr  Lavera Guise Internal medicine

## 2018-10-12 DIAGNOSIS — N369 Urethral disorder, unspecified: Secondary | ICD-10-CM | POA: Insufficient documentation

## 2018-11-23 ENCOUNTER — Other Ambulatory Visit: Payer: Self-pay

## 2018-11-23 DIAGNOSIS — I10 Essential (primary) hypertension: Secondary | ICD-10-CM

## 2018-11-23 MED ORDER — HYDROCHLOROTHIAZIDE 25 MG PO TABS: 25.0000 mg | ORAL_TABLET | Freq: Every day | ORAL | 5 refills | Status: DC

## 2018-11-26 ENCOUNTER — Other Ambulatory Visit: Payer: Self-pay | Admitting: Nurse Practitioner

## 2018-11-26 ENCOUNTER — Ambulatory Visit: Payer: BC Managed Care – PPO | Admitting: Nurse Practitioner

## 2018-11-26 ENCOUNTER — Encounter: Payer: Self-pay | Admitting: Nurse Practitioner

## 2018-11-26 ENCOUNTER — Other Ambulatory Visit: Payer: Self-pay

## 2018-11-26 VITALS — BP 135/87 | HR 62 | Temp 98.4°F | Resp 16 | Ht 67.0 in | Wt 226.8 lb

## 2018-11-26 DIAGNOSIS — Z91018 Allergy to other foods: Secondary | ICD-10-CM | POA: Diagnosis not present

## 2018-11-26 NOTE — Progress Notes (Signed)
[facility] [place], [place] [address]  Internal MEDICINE  Office Visit Note  Patient Name: Audrey Ball  [id number]  [id number]  Date of Service: 12/01/2018    Pt is here for a sick visit.  Chief Complaint  Patient presents with  . Edema    facae and lip swelling, feels a tingling feeling, on both top and bottom lip,pt has not changed anything nor ate anything different, possible allergic reaction, pt has tried OTC medication and its not working, pt is currently experiencing some tingling only on one side of the lip     The patient is here for sick visit. She states that on Monday and wednesday, she had sudden lip swelling and a a little swelling on the right side of her face. She is unaware of any different food she may have consumed. She has changed nothing in her diet. She states that both times, she did take benadryl to help with symptoms. Got very little relief and made her very sleepy.       Current Medication:  Outpatient Encounter Medications as of 11/26/2018  Medication Sig  . amphetamine-dextroamphetamine (ADDERALL) 20 MG tablet TAKE 1 TABLET BY MOUTH TWICE A DAY AS NEEDED FOR FOCUS  . betamethasone valerate ointment (VALISONE) 0.1 % Apply 1 application topically 2 (two) times daily.  . ferrous sulfate 325 (65 FE) MG EC tablet Take by mouth.  . fluconazole (DIFLUCAN) 150 MG tablet Take 1 tablet po Q week to prevent yeast infection  . hydrochlorothiazide (HYDRODIURIL) 25 MG tablet Take 1 tablet (25 mg total) by mouth daily.  . traMADol (ULTRAM) 50 MG tablet Take one tab po qd prn for pain  . valACYclovir (VALTREX) 1000 MG tablet Take 1 tablet (1,000 mg total) by mouth 2 (two) times daily.   No facility-administered encounter medications on file as of 11/26/2018.       Medical History: Past Medical History:  Diagnosis Date  . Hypertension      Today's Vitals   11/26/18 1033  BP: 135/87  Pulse: 62  Resp: 16  Temp: 98.4 F  (36.9 C)  SpO2: 97%  Weight: 226 lb 12.8 oz (102.9 kg)  Height: 5\' 7"  (1.702 m)   Body mass index is 35.52 kg/m.  Review of Systems  Constitutional: Positive for fatigue. Negative for chills and unexpected weight change.  HENT: Positive for facial swelling. Negative for congestion, postnasal drip, rhinorrhea, sneezing and sore throat.        Swelling of lips after eating.   Respiratory: Negative for cough, chest tightness, shortness of breath and wheezing.   Cardiovascular: Negative for chest pain and palpitations.  Gastrointestinal: Negative for abdominal pain, constipation, diarrhea, nausea and vomiting.  Endocrine: Negative for polyphagia.  Musculoskeletal: Negative for arthralgias, back pain, joint swelling and neck pain.  Skin: Negative for rash.  Allergic/Immunologic: Positive for environmental allergies and food allergies.       Unsure of cause.   Neurological: Negative for dizziness, tremors, numbness and headaches.  Hematological: Negative for adenopathy. Does not bruise/bleed easily.  Psychiatric/Behavioral: Negative for behavioral problems (Depression), sleep disturbance and suicidal ideas. The patient is not nervous/anxious.     Physical Exam Vitals signs and nursing note reviewed.  Constitutional:      General: She is not in acute distress.    Appearance: Normal appearance. She is well-developed. She is not diaphoretic.  HENT:     Head: Normocephalic and atraumatic.     Mouth/Throat:  Mouth: Mucous membranes are moist. No angioedema.     Pharynx: Oropharynx is clear. No oropharyngeal exudate.  Eyes:     Extraocular Movements: Extraocular movements intact.     Conjunctiva/sclera: Conjunctivae normal.     Pupils: Pupils are equal, round, and reactive to light.  Neck:     Musculoskeletal: Normal range of motion and neck supple.     Thyroid: No thyromegaly.     Vascular: No JVD.     Trachea: No tracheal deviation.  Cardiovascular:     Rate and Rhythm: Normal  rate and regular rhythm.     Heart sounds: Normal heart sounds. No murmur. No friction rub. No gallop.   Pulmonary:     Effort: Pulmonary effort is normal. No respiratory distress.     Breath sounds: No wheezing or rales.  Chest:     Chest wall: No tenderness.  Abdominal:     General: Bowel sounds are normal.     Palpations: Abdomen is soft.  Musculoskeletal: Normal range of motion.  Lymphadenopathy:     Cervical: No cervical adenopathy.  Skin:    General: Skin is warm and dry.  Neurological:     Mental Status: She is alert and oriented to person, place, and time.     Cranial Nerves: No cranial nerve deficit.  Psychiatric:        Behavior: Behavior normal.        Thought Content: Thought content normal.        Judgment: Judgment normal.   Assessment/Plan:  1. Food allergy Unsure of cause. Patient unable to determine specific food or exposure which is causing intermittent symptoms. Will order blood test to look at basic food allergies with reflex components. Will refer to allergy testing if blood work is negative. Encouraged her to take OTC benadryl and pepcid as needed for new symptom development.   General Counseling: Audrey Ball verbalizes understanding of the findings of todays visit and agrees with plan of treatment. I have discussed any further diagnostic evaluation that may be needed or ordered today. We also reviewed her medications today. she has been encouraged to call the office with any questions or concerns that should arise related to todays visit.    Counseling:  This patient was seen by Leretha Pol FNP Collaboration with Dr Lavera Guise as a part of collaborative care agreement  Time spent: 25 Minutes

## 2018-11-28 LAB — IGE FOOD W/COMPONENT REFLEX II
Allergen Corn, IgE: <0.1 kU/L
Clam IgE: <0.1 kU/L
Codfish IgE: <0.1 kU/L
F001-IgE Egg White: <0.1 kU/L
F002-IgE Milk: <0.1 kU/L
F017-IgE Hazelnut (Filbert): <0.1 kU/L
F018-IgE Brazil Nut: <0.1 kU/L
F020-IgE Almond: <0.1 kU/L
F202-IgE Cashew Nut: <0.1 kU/L
F203-IgE Pistachio Nut: <0.1 kU/L
F256-IgE Walnut: <0.1 kU/L
Macadamia Nut, IgE: <0.1 kU/L
Peanut, IgE: <0.1 kU/L
Pecan Nut IgE: <0.1 kU/L
Scallop IgE: <0.1 kU/L
Sesame Seed IgE: <0.1 kU/L
Shrimp IgE: <0.1 kU/L
Soybean IgE: <0.1 kU/L
Wheat IgE: <0.1 kU/L

## 2018-12-01 DIAGNOSIS — Z91018 Allergy to other foods: Secondary | ICD-10-CM | POA: Insufficient documentation

## 2018-12-16 ENCOUNTER — Ambulatory Visit: Payer: BLUE CROSS/BLUE SHIELD | Admitting: Nurse Practitioner

## 2018-12-20 ENCOUNTER — Ambulatory Visit (INDEPENDENT_AMBULATORY_CARE_PROVIDER_SITE_OTHER): Payer: BC Managed Care – PPO | Admitting: Urology

## 2018-12-20 ENCOUNTER — Other Ambulatory Visit: Payer: Self-pay

## 2018-12-20 ENCOUNTER — Encounter: Payer: Self-pay | Admitting: Urology

## 2018-12-20 VITALS — BP 139/83 | HR 63 | Ht 67.0 in | Wt 229.7 lb

## 2018-12-20 DIAGNOSIS — N898 Other specified noninflammatory disorders of vagina: Secondary | ICD-10-CM | POA: Diagnosis not present

## 2018-12-20 DIAGNOSIS — N362 Urethral caruncle: Secondary | ICD-10-CM | POA: Diagnosis not present

## 2018-12-20 LAB — MICROSCOPIC EXAMINATION: RBC, Urine: NONE SEEN /hpf (ref 0–2)

## 2018-12-20 LAB — URINALYSIS, COMPLETE
Bilirubin, UA: NEGATIVE
Glucose, UA: NEGATIVE
Ketones, UA: NEGATIVE
Leukocytes,UA: NEGATIVE
Nitrite, UA: NEGATIVE
Protein,UA: NEGATIVE
Specific Gravity, UA: 1.01 (ref 1.005–1.030)
Urobilinogen, Ur: 0.2 mg/dL (ref 0.2–1.0)
pH, UA: 6 (ref 5.0–7.5)

## 2018-12-20 NOTE — Progress Notes (Signed)
12/20/2018 12:32 PM   Audrey Ball 1971/05/29 [id number]  Referring provider: Ronnell Freshwater, NP [address] [place],  [place] [address]  Chief Complaint  Patient presents with  . Establish Care    HPI: 48 year old female seen in consultation at the request of Leretha Pol, NP for evaluation of a vaginal mass.  She was seen on 10/11/2018 and noted to have "swelling of the urethra".  There is mention in the note of a palpable mass above the urethra however the patient indicates the area that she notes is intermittently swollen most further posterior than the urethra.  She has no pain or discomfort.  She does have a history of chronic yeast vaginitis.  She has no voiding complaints and denies history of recurrent UTIs.   PMH: Past Medical History:  Diagnosis Date  . Hypertension     Surgical History: Past Surgical History:  Procedure Laterality Date  . BACK SURGERY  2017  . GALLBLADDER SURGERY  1999  . PARTIAL HYSTERECTOMY  2016    Home Medications:  Allergies as of 12/20/2018      Reactions   Penicillamine Rash   Penicillins    REACTION: Rash   Propoxyphene Other (See Comments)   Other reaction(s): Headache REACTION: Headache headache   Propoxyphene N-acetaminophen    REACTION: Headache      Medication List       Accurate as of December 20, 2018 12:32 PM. If you have any questions, ask your nurse or doctor.        STOP taking these medications   fluconazole 150 MG tablet Commonly known as: DIFLUCAN Stopped by: Abbie Sons, MD     TAKE these medications   amphetamine-dextroamphetamine 20 MG tablet Commonly known as: ADDERALL TAKE 1 TABLET BY MOUTH TWICE A DAY AS NEEDED FOR FOCUS   betamethasone valerate ointment 0.1 % Commonly known as: VALISONE Apply 1 application topically 2 (two) times daily.   ferrous sulfate 325 (65 FE) MG EC tablet Take by mouth.   hydrochlorothiazide 25 MG tablet Commonly known as: HYDRODIURIL Take 1 tablet  (25 mg total) by mouth daily.   meloxicam 15 MG tablet Commonly known as: MOBIC TAKE 1 TABLET BY MOUTH ONCE DAY WITH MEAL   traMADol 50 MG tablet Commonly known as: ULTRAM Take one tab po qd prn for pain   valACYclovir 1000 MG tablet Commonly known as: VALTREX Take 1 tablet (1,000 mg total) by mouth 2 (two) times daily.       Allergies:  Allergies  Allergen Reactions  . Penicillamine Rash  . Penicillins     REACTION: Rash  . Propoxyphene Other (See Comments)    Other reaction(s): Headache REACTION: Headache headache   . Propoxyphene N-Acetaminophen     REACTION: Headache    Family History: Family History  Problem Relation Age of Onset  . Leukemia Mother   . Hypertension Mother   . Diabetes Mother   . Lung cancer Father     Social History:  reports that she has never smoked. She has never used smokeless tobacco. She reports that she does not drink alcohol or use drugs.  ROS: UROLOGY Frequent Urination?: No Hard to postpone urination?: No Burning/pain with urination?: No Get up at night to urinate?: No Leakage of urine?: No Urine stream starts and stops?: No Trouble starting stream?: No Do you have to strain to urinate?: No Blood in urine?: No Urinary tract infection?: No Sexually transmitted disease?: No Injury to kidneys or bladder?:  No Painful intercourse?: No Weak stream?: No Currently pregnant?: No Vaginal bleeding?: No Last menstrual period?: Partial Hysterectomy  Gastrointestinal Nausea?: No Vomiting?: No Indigestion/heartburn?: No Diarrhea?: No Constipation?: No  Constitutional Fever: No Night sweats?: No Weight loss?: No Fatigue?: No  Skin Skin rash/lesions?: No Itching?: No  Eyes Blurred vision?: No Double vision?: No  Ears/Nose/Throat Sore throat?: No Sinus problems?: No  Hematologic/Lymphatic Easy bruising?: No  Cardiovascular Leg swelling?: No Chest pain?: No  Respiratory Cough?: No Shortness of breath?: No   Endocrine Excessive thirst?: No  Musculoskeletal Back pain?: No Joint pain?: No  Neurological Headaches?: No Dizziness?: No  Psychologic Depression?: No Anxiety?: No  Physical Exam: BP 139/83 (BP Location: Left Arm, Patient Position: Sitting, Cuff Size: Large)   Pulse 63   Ht 5\' 7"  (1.702 m)   Wt 229 lb 11.2 oz (104.2 kg)   BMI 35.98 kg/m   Constitutional:  Alert and oriented, No acute distress. HEENT: [place] AT, moist mucus membranes.  Trachea midline, no masses. Cardiovascular: No clubbing, cyanosis, or edema. Respiratory: Normal respiratory effort, no increased work of breathing. GI: Abdomen is soft, nontender, nondistended, no abdominal masses GU: No CVA tenderness.  The urethra with a small caruncle.  No palpable mass in the periurethral area.  Grade 1 cystocele. Lymph: No cervical or inguinal lymphadenopathy. Skin: No rashes, bruises or suspicious lesions. Neurologic: Grossly intact, no focal deficits, moving all 4 extremities. Psychiatric: Normal mood and affect.  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  The area she has noted is much further posterior than the urethra and would recommend a gynecologic opinion.  Abbie Sons, [place] [address] [place],  [address] [phone]

## 2018-12-20 NOTE — Addendum Note (Signed)
Addended by: Donalee Citrin on: 12/20/2018 01:48 PM   Modules accepted: Orders

## 2018-12-29 ENCOUNTER — Encounter: Payer: Self-pay | Admitting: Obstetrics and Gynecology

## 2018-12-29 ENCOUNTER — Telehealth: Payer: Self-pay | Admitting: Obstetrics and Gynecology

## 2018-12-29 NOTE — Telephone Encounter (Signed)
[facility]  referring for Vaginal mass. Patient was schedule on 12/29/18 with [facility] and called and cancelled appointment reporting she would call back to reschedule. Called and left voicemail for patient to call back to be schedule

## 2018-12-30 NOTE — Telephone Encounter (Signed)
Called and left voice mail for patient to call back to be schedule °

## 2019-01-10 ENCOUNTER — Ambulatory Visit: Payer: BC Managed Care – PPO | Admitting: Nurse Practitioner

## 2019-01-14 ENCOUNTER — Ambulatory Visit: Payer: BC Managed Care – PPO | Admitting: Obstetrics and Gynecology

## 2019-01-14 ENCOUNTER — Encounter: Payer: Self-pay | Admitting: Obstetrics and Gynecology

## 2019-01-14 ENCOUNTER — Other Ambulatory Visit: Payer: Self-pay

## 2019-01-14 VITALS — BP 128/84 | Ht 67.0 in | Wt 226.0 lb

## 2019-01-14 DIAGNOSIS — N812 Incomplete uterovaginal prolapse: Secondary | ICD-10-CM

## 2019-01-14 NOTE — Progress Notes (Signed)
Obstetrics & Gynecology Office Visit   Chief Complaint  Patient presents with  . Referral    vaginal mass  Referral from Dr. John Giovanni, MD, from [facility] for vaginal mass  History of Present Illness: 48 y.o. [phone]  female who presents in referral from [place], Dr. John Giovanni, for a vaginal mass.  She has a history of a total vaginal hysterectomy and bilateral salpingectomy due to abnormal uterine bleeding and fibroids in 2016 at [facility].  She notes a bump on her vagina on the side closest to her bottom.  She can't remember when she first noticed it. She does note that it was after her hysterectomy in 2016.  She notes it might get a little smaller when she takes medication for a yeast infection.  She states that it doesn't cause her problems. She just wonders whether it should be there. She believes that it has gotten bigger over time.  Her last pap smear was about 1 year ago and it was normal, though she has no cervix.  She states that it is not smooth.  She states that it is the size of a large walnut.  It does not interfere with her sexual life. She has normal bowel and bladder habits. It has never bled.     Past Medical History:  Diagnosis Date  . Hypertension     Past Surgical History:  Procedure Laterality Date  . BACK SURGERY  2017  . GALLBLADDER SURGERY  1999  . TOTAL VAGINAL HYSTERECTOMY  2016   bilateral salpingectomy at [facility] for abnormal uterine bleeding    Gynecologic History: No LMP recorded. Patient has had a hysterectomy.  Obstetric History: [id number]   Family History  Problem Relation Age of Onset  . Leukemia Mother   . Hypertension Mother   . Diabetes Mother   . Lung cancer Father     Social History   Socioeconomic History  . Marital status: Single    Spouse name: Not on file  . Number of children: Not on file  . Years of education: Not on file  . Highest education level: Not on file  Occupational  History  . Not on file  Social Needs  . Financial resource strain: Not on file  . Food insecurity    Worry: Not on file    Inability: Not on file  . Transportation needs    Medical: Not on file    Non-medical: Not on file  Tobacco Use  . Smoking status: Never Smoker  . Smokeless tobacco: Never Used  Substance and Sexual Activity  . Alcohol use: No    Frequency: Never  . Drug use: No  . Sexual activity: Yes    Birth control/protection: Surgical  Lifestyle  . Physical activity    Days per week: Not on file    Minutes per session: Not on file  . Stress: Not on file  Relationships  . Social Herbalist on phone: Not on file    Gets together: Not on file    Attends religious service: Not on file    Active member of club or organization: Not on file    Attends meetings of clubs or organizations: Not on file    Relationship status: Not on file  . Intimate partner violence    Fear of current or ex partner: Not on file    Emotionally abused: Not on file    Physically abused: Not on file  Forced sexual activity: Not on file  Other Topics Concern  . Not on file  Social History Narrative  . Not on file    Allergies  Allergen Reactions  . Penicillamine Rash  . Penicillins     REACTION: Rash  . Propoxyphene Other (See Comments)    Other reaction(s): Headache REACTION: Headache headache   . Propoxyphene N-Acetaminophen     REACTION: Headache    Prior to Admission medications   Medication Sig Start Date End Date Taking? Authorizing Provider  amphetamine-dextroamphetamine (ADDERALL) 20 MG tablet TAKE 1 TABLET BY MOUTH TWICE A DAY AS NEEDED FOR FOCUS 06/15/18   Ronnell Freshwater, NP  betamethasone valerate ointment (VALISONE) 0.1 % Apply 1 application topically 2 (two) times daily. 09/01/17   Lavera Guise, MD  hydrochlorothiazide (HYDRODIURIL) 25 MG tablet Take 1 tablet (25 mg total) by mouth daily. 11/23/18   Lavera Guise, MD  meloxicam (MOBIC) 15 MG tablet TAKE  1 TABLET BY MOUTH ONCE DAY WITH MEAL 12/02/18   [provider]  traMADol Veatrice Bourbon) 50 MG tablet Take one tab po qd prn for pain 09/27/18   Lavera Guise, MD  valACYclovir (VALTREX) 1000 MG tablet Take 1 tablet (1,000 mg total) by mouth 2 (two) times daily. 09/21/18   Kendell Bane, NP    Review of Systems  Constitutional: Negative.   HENT: Negative.   Eyes: Negative.   Respiratory: Negative.   Cardiovascular: Negative.   Gastrointestinal: Negative.   Genitourinary: Negative.   Musculoskeletal: Negative.   Skin: Negative.   Neurological: Negative.   Psychiatric/Behavioral: Negative.      Physical Exam BP 128/84   Ht 5\' 7"  (1.702 m)   Wt 226 lb (102.5 kg)   BMI 35.40 kg/m  No LMP recorded. Patient has had a hysterectomy. Physical Exam Constitutional:      General: She is not in acute distress.    Appearance: Normal appearance. She is well-developed.  Genitourinary:     Pelvic exam was performed with patient in the lithotomy position.     Vulva, inguinal canal, urethra, bladder, vagina, uterus, right adnexa and left adnexa normal.     No posterior fourchette tenderness, injury or lesion present.     Urethral caruncle present.     No signs of injury or lesions in the vagina.     Vaginal prolapse (grade 2 cystocele (bladder comes to the level of the hymen)) present.     No foreign body in the vagina.     No cervical friability, lesion, bleeding or polyp.  HENT:     Head: Normocephalic and atraumatic.  Eyes:     General: No scleral icterus.    Conjunctiva/sclera: Conjunctivae normal.  Neck:     Musculoskeletal: Normal range of motion and neck supple.  Cardiovascular:     Rate and Rhythm: Normal rate and regular rhythm.     Heart sounds: No murmur. No friction rub. No gallop.   Pulmonary:     Effort: Pulmonary effort is normal. No respiratory distress.     Breath sounds: Normal breath sounds. No wheezing or rales.  Abdominal:     General: Bowel sounds are normal.  There is no distension.     Palpations: Abdomen is soft. There is no mass.     Tenderness: There is no abdominal tenderness. There is no guarding or rebound.  Musculoskeletal: Normal range of motion.  Neurological:     General: No focal deficit present.     Mental  Status: She is alert and oriented to person, place, and time.     Cranial Nerves: No cranial nerve deficit.  Skin:    General: Skin is warm and dry.     Findings: No erythema.  Psychiatric:        Mood and Affect: Mood normal.        Behavior: Behavior normal.        Judgment: Judgment normal.    Female chaperone present for pelvic and breast  portions of the physical exam  Assessment: 48 y.o. No obstetric history on file. female here for  1. Cystocele with incomplete uterovaginal prolapse      Plan: Problem List Items Addressed This Visit    None    Visit Diagnoses    Cystocele with incomplete uterovaginal prolapse    -  Primary     Reassured the patient that the only abnormal finding was a cystocele that was a grade 2 prolapse.  The cystocele is visible from the external viewpoint and lithotomy.  She is having no urinary symptoms at this point and is otherwise not bothered by the cystocele.  We discussed potential treatments.  However, since she is not having any issues at the moment, no treatment is recommended.  She voiced understanding and agreement with this plan and will follow-up, if her symptoms worsen.  30 minutes spent in face to face discussion with > 50% spent in counseling,management, and coordination of care of her cystocele with incomplete uterovaginal prolapse.   Prentice Docker, MD 01/14/2019 1:37 PM     CC: John Giovanni, MD [facility] [address] [place], [place] [address] [phone]

## 2019-01-18 ENCOUNTER — Other Ambulatory Visit: Payer: Self-pay

## 2019-01-18 ENCOUNTER — Encounter: Payer: Self-pay | Admitting: Nurse Practitioner

## 2019-01-18 ENCOUNTER — Ambulatory Visit: Payer: BC Managed Care – PPO | Admitting: Nurse Practitioner

## 2019-01-18 VITALS — BP 132/90 | HR 98 | Resp 16 | Ht 67.0 in | Wt 227.4 lb

## 2019-01-18 DIAGNOSIS — F988 Other specified behavioral and emotional disorders with onset usually occurring in childhood and adolescence: Secondary | ICD-10-CM

## 2019-01-18 DIAGNOSIS — L308 Other specified dermatitis: Secondary | ICD-10-CM

## 2019-01-18 DIAGNOSIS — Z79899 Other long term (current) drug therapy: Secondary | ICD-10-CM | POA: Diagnosis not present

## 2019-01-18 DIAGNOSIS — I10 Essential (primary) hypertension: Secondary | ICD-10-CM

## 2019-01-18 LAB — POCT URINE DRUG SCREEN
POC Amphetamine UR: POSITIVE — AB
POC BENZODIAZEPINES UR: NOT DETECTED
POC Barbiturate UR: NOT DETECTED
POC Cocaine UR: NOT DETECTED
POC Ecstasy UR: NOT DETECTED
POC Marijuana UR: NOT DETECTED
POC Methadone UR: NOT DETECTED
POC Methamphetamine UR: NOT DETECTED
POC Opiate Ur: NOT DETECTED
POC Oxycodone UR: NOT DETECTED
POC PHENCYCLIDINE UR: NOT DETECTED
POC TRICYCLICS UR: NOT DETECTED

## 2019-01-18 MED ORDER — BETAMETHASONE VALERATE 0.1 % EX OINT: 1.0000 "application " | TOPICAL_OINTMENT | Freq: Two times a day (BID) | CUTANEOUS | 3 refills | Status: DC

## 2019-01-18 MED ORDER — AMPHETAMINE-DEXTROAMPHETAMINE 20 MG PO TABS: ORAL_TABLET | ORAL | 0 refills | Status: DC

## 2019-01-18 NOTE — Progress Notes (Signed)
[facility] [place], [place] [address]  Internal MEDICINE  Office Visit Note  Patient Name: Audrey Ball  [id number]  [id number]  Date of Service: 01/30/2019  Chief Complaint  Patient presents with  . Medical Management of Chronic Issues  . Hypertension  . Medication Refill    adderall and ointment    The patient is here for routine follow up. Blood pressure is well controlled. She does take adderall 20mg  twice daily for focus and concentration. Works for [employer]. This medication helps her to pay attention and stay on track. She has no negative side effects from taking this medication.       Current Medication: Outpatient Encounter Medications as of 01/18/2019  Medication Sig  . amphetamine-dextroamphetamine (ADDERALL) 20 MG tablet TAKE 1 TABLET BY MOUTH TWICE A DAY AS NEEDED FOR FOCUS  . betamethasone valerate ointment (VALISONE) 0.1 % Apply 1 application topically 2 (two) times daily.  . hydrochlorothiazide (HYDRODIURIL) 25 MG tablet Take 1 tablet (25 mg total) by mouth daily.  . meloxicam (MOBIC) 15 MG tablet TAKE 1 TABLET BY MOUTH ONCE DAY WITH MEAL  . traMADol (ULTRAM) 50 MG tablet Take one tab po qd prn for pain  . valACYclovir (VALTREX) 1000 MG tablet Take 1 tablet (1,000 mg total) by mouth 2 (two) times daily.  . [DISCONTINUED] amphetamine-dextroamphetamine (ADDERALL) 20 MG tablet TAKE 1 TABLET BY MOUTH TWICE A DAY AS NEEDED FOR FOCUS  . [DISCONTINUED] amphetamine-dextroamphetamine (ADDERALL) 20 MG tablet TAKE 1 TABLET BY MOUTH TWICE A DAY AS NEEDED FOR FOCUS  . [DISCONTINUED] amphetamine-dextroamphetamine (ADDERALL) 20 MG tablet TAKE 1 TABLET BY MOUTH TWICE A DAY AS NEEDED FOR FOCUS  . [DISCONTINUED] betamethasone valerate ointment (VALISONE) 0.1 % Apply 1 application topically 2 (two) times daily.  . [DISCONTINUED] ferrous sulfate 325 (65 FE) MG EC tablet Take by mouth.   No facility-administered encounter medications on  file as of 01/18/2019.     Surgical History: Past Surgical History:  Procedure Laterality Date  . BACK SURGERY  2017  . GALLBLADDER SURGERY  1999  . TOTAL VAGINAL HYSTERECTOMY  2016   bilateral salpingectomy at [facility] for abnormal uterine bleeding    Medical History: Past Medical History:  Diagnosis Date  . Hypertension     Family History: Family History  Problem Relation Age of Onset  . Leukemia Mother   . Hypertension Mother   . Diabetes Mother   . Lung cancer Father     Social History   Socioeconomic History  . Marital status: Single    Spouse name: Not on file  . Number of children: Not on file  . Years of education: Not on file  . Highest education level: Not on file  Occupational History  . Not on file  Social Needs  . Financial resource strain: Not on file  . Food insecurity    Worry: Not on file    Inability: Not on file  . Transportation needs    Medical: Not on file    Non-medical: Not on file  Tobacco Use  . Smoking status: Never Smoker  . Smokeless tobacco: Never Used  Substance and Sexual Activity  . Alcohol use: No    Frequency: Never  . Drug use: No  . Sexual activity: Yes    Birth control/protection: Surgical  Lifestyle  . Physical activity    Days per week: Not on file    Minutes per session: Not on file  . Stress: Not on file  Relationships  . Social Herbalist on phone: Not on file    Gets together: Not on file    Attends religious service: Not on file    Active member of club or organization: Not on file    Attends meetings of clubs or organizations: Not on file    Relationship status: Not on file  . Intimate partner violence    Fear of current or ex partner: Not on file    Emotionally abused: Not on file    Physically abused: Not on file    Forced sexual activity: Not on file  Other Topics Concern  . Not on file  Social History Narrative  . Not on file      Review of Systems  Constitutional: Negative for  chills, fatigue and unexpected weight change.  HENT: Negative for congestion, facial swelling, postnasal drip, rhinorrhea, sneezing and sore throat.   Respiratory: Negative for cough, chest tightness, shortness of breath and wheezing.   Cardiovascular: Negative for chest pain and palpitations.       Blood pressure well controlled.   Gastrointestinal: Negative for abdominal pain, constipation, diarrhea, nausea and vomiting.  Endocrine: Negative for cold intolerance, heat intolerance, polydipsia and polyuria.  Musculoskeletal: Negative for arthralgias, back pain, joint swelling and neck pain.  Skin: Negative for rash.  Neurological: Negative for dizziness, tremors, numbness and headaches.  Hematological: Negative for adenopathy. Does not bruise/bleed easily.  Psychiatric/Behavioral: Positive for decreased concentration. Negative for behavioral problems (Depression), sleep disturbance and suicidal ideas. The patient is not nervous/anxious.     Today's Vitals   01/18/19 1601  BP: 132/90  Pulse: 98  Resp: 16  SpO2: 97%  Weight: 227 lb 6.4 oz (103.1 kg)  Height: 5\' 7"  (1.702 m)   Body mass index is 35.62 kg/m.  Physical Exam Vitals signs and nursing note reviewed.  Constitutional:      General: She is not in acute distress.    Appearance: Normal appearance. She is well-developed. She is not diaphoretic.  HENT:     Head: Normocephalic and atraumatic.     Mouth/Throat:     Mouth: Mucous membranes are moist. No angioedema.     Pharynx: Oropharynx is clear. No oropharyngeal exudate.  Eyes:     Extraocular Movements: Extraocular movements intact.     Conjunctiva/sclera: Conjunctivae normal.     Pupils: Pupils are equal, round, and reactive to light.  Neck:     Musculoskeletal: Normal range of motion and neck supple.     Thyroid: No thyromegaly.     Vascular: No JVD.     Trachea: No tracheal deviation.  Cardiovascular:     Rate and Rhythm: Normal rate and regular rhythm.     Heart  sounds: Normal heart sounds. No murmur. No friction rub. No gallop.   Pulmonary:     Effort: Pulmonary effort is normal. No respiratory distress.     Breath sounds: Normal breath sounds. No wheezing or rales.  Chest:     Chest wall: No tenderness.  Abdominal:     General: Bowel sounds are normal.     Palpations: Abdomen is soft.  Musculoskeletal: Normal range of motion.  Lymphadenopathy:     Cervical: No cervical adenopathy.  Skin:    General: Skin is warm and dry.  Neurological:     Mental Status: She is alert and oriented to person, place, and time.     Cranial Nerves: No cranial nerve deficit.  Psychiatric:  Behavior: Behavior normal.        Thought Content: Thought content normal.        Judgment: Judgment normal.   Assessment/Plan: 1. Essential hypertension, benign Stable. Continue bp medication as prescribed   2. Attention deficit disorder of adult May continue to take adderall 20mg  twice daily if needed to help with focus and concentration. Three 30 day prescriptions sent to her pharmacy. Dates are 01/18/2019, 02/16/2019, and 03/17/2019. - amphetamine-dextroamphetamine (ADDERALL) 20 MG tablet; TAKE 1 TABLET BY MOUTH TWICE A DAY AS NEEDED FOR FOCUS  Dispense: 60 tablet; Refill: 0  3. Other eczema Continue to use betamethasone ointment as needed and as prescribed  - betamethasone valerate ointment (VALISONE) 0.1 %; Apply 1 application topically 2 (two) times daily.  Dispense: 45 g; Refill: 3  4. Encounter for long-term (current) use of medications - POCT Urine Drug Screen appropriately positive for AMP only.   General Counseling: Meko verbalizes understanding of the findings of todays visit and agrees with plan of treatment. I have discussed any further diagnostic evaluation that may be needed or ordered today. We also reviewed her medications today. she has been encouraged to call the office with any questions or concerns that should arise related to todays  visit.  Refilled Controlled medications today. Reviewed risks and possible side effects associated with taking Stimulants. Combination of these drugs with other psychotropic medications could cause dizziness and drowsiness. Pt needs to Monitor symptoms and exercise caution in driving and operating heavy machinery to avoid damages to oneself, to others and to the surroundings. Patient verbalized understanding in this matter. Dependence and abuse for these drugs will be monitored closely. A Controlled substance policy and procedure is on file which allows [facility] to order a urine drug screen test at any visit. Patient understands and agrees with the plan..  This patient was seen by Leretha Pol FNP Collaboration with Dr Lavera Guise as a part of collaborative care agreement  Orders Placed This Encounter  Procedures  . POCT Urine Drug Screen    Meds ordered this encounter  Medications  . DISCONTD: amphetamine-dextroamphetamine (ADDERALL) 20 MG tablet    Sig: TAKE 1 TABLET BY MOUTH TWICE A DAY AS NEEDED FOR FOCUS    Dispense:  60 tablet    Refill:  0    Order Specific Question:   Supervising Provider    Answer:   Lavera Guise [[id number]]  . betamethasone valerate ointment (VALISONE) 0.1 %    Sig: Apply 1 application topically 2 (two) times daily.    Dispense:  45 g    Refill:  3    Order Specific Question:   Supervising Provider    Answer:   Lavera Guise [[id number]]  . DISCONTD: amphetamine-dextroamphetamine (ADDERALL) 20 MG tablet    Sig: TAKE 1 TABLET BY MOUTH TWICE A DAY AS NEEDED FOR FOCUS    Dispense:  60 tablet    Refill:  0    Fill after 02/16/2019    Order Specific Question:   Supervising Provider    Answer:   Lavera Guise [place]  . amphetamine-dextroamphetamine (ADDERALL) 20 MG tablet    Sig: TAKE 1 TABLET BY MOUTH TWICE A DAY AS NEEDED FOR FOCUS    Dispense:  60 tablet    Refill:  0    Fill after 03/17/2019    Order Specific Question:   Supervising Provider     Answer:   Lavera Guise [[id number]]    Time spent:  [place] [facility]

## 2019-01-30 DIAGNOSIS — L309 Dermatitis, unspecified: Secondary | ICD-10-CM | POA: Insufficient documentation

## 2019-02-14 ENCOUNTER — Ambulatory Visit: Payer: BC Managed Care – PPO | Admitting: Adult Health

## 2019-02-14 ENCOUNTER — Other Ambulatory Visit: Payer: Self-pay

## 2019-02-14 ENCOUNTER — Encounter: Payer: Self-pay | Admitting: Adult Health

## 2019-02-14 VITALS — Ht 67.0 in | Wt 225.0 lb

## 2019-02-14 DIAGNOSIS — I10 Essential (primary) hypertension: Secondary | ICD-10-CM

## 2019-02-14 DIAGNOSIS — J0100 Acute maxillary sinusitis, unspecified: Secondary | ICD-10-CM

## 2019-02-14 MED ORDER — AZITHROMYCIN 250 MG PO TABS: ORAL_TABLET | ORAL | 0 refills | Status: DC

## 2019-02-14 NOTE — Progress Notes (Signed)
[facility] [place], [place] [address]  Internal MEDICINE  Telephone Visit  Patient Name: Audrey Ball  [id number]  [id number]  Date of Service: 02/14/2019  I connected with the patient at 505 by telephone and verified the patients identity using two identifiers.   I discussed the limitations, risks, security and privacy concerns of performing an evaluation and management service by telephone and the availability of in person appointments. I also discussed with the patient that there may be a patient responsible charge related to the service.  The patient expressed understanding and agrees to proceed.    Chief Complaint  Patient presents with  . Telephone Screen  . Sinusitis    facial pain , congestion , no coughing , no sob  . Headache  . Telephone Assessment    HPI  Pt seen for facial pain and pressure, with congestion.  She denies sob, coughing or fever. She has a history of sinus infections and believes this is another infection.    Current Medication: Outpatient Encounter Medications as of 02/14/2019  Medication Sig  . amphetamine-dextroamphetamine (ADDERALL) 20 MG tablet TAKE 1 TABLET BY MOUTH TWICE A DAY AS NEEDED FOR FOCUS  . betamethasone valerate ointment (VALISONE) 0.1 % Apply 1 application topically 2 (two) times daily.  . hydrochlorothiazide (HYDRODIURIL) 25 MG tablet Take 1 tablet (25 mg total) by mouth daily.  . meloxicam (MOBIC) 15 MG tablet TAKE 1 TABLET BY MOUTH ONCE DAY WITH MEAL  . traMADol (ULTRAM) 50 MG tablet Take one tab po qd prn for pain  . valACYclovir (VALTREX) 1000 MG tablet Take 1 tablet (1,000 mg total) by mouth 2 (two) times daily.   No facility-administered encounter medications on file as of 02/14/2019.     Surgical History: Past Surgical History:  Procedure Laterality Date  . BACK SURGERY  2017  . GALLBLADDER SURGERY  1999  . TOTAL VAGINAL HYSTERECTOMY  2016   bilateral salpingectomy at [facility] for abnormal  uterine bleeding    Medical History: Past Medical History:  Diagnosis Date  . Hypertension     Family History: Family History  Problem Relation Age of Onset  . Leukemia Mother   . Hypertension Mother   . Diabetes Mother   . Lung cancer Father     Social History   Socioeconomic History  . Marital status: Single    Spouse name: Not on file  . Number of children: Not on file  . Years of education: Not on file  . Highest education level: Not on file  Occupational History  . Not on file  Social Needs  . Financial resource strain: Not on file  . Food insecurity    Worry: Not on file    Inability: Not on file  . Transportation needs    Medical: Not on file    Non-medical: Not on file  Tobacco Use  . Smoking status: Never Smoker  . Smokeless tobacco: Never Used  Substance and Sexual Activity  . Alcohol use: No    Frequency: Never  . Drug use: No  . Sexual activity: Yes    Birth control/protection: Surgical  Lifestyle  . Physical activity    Days per week: Not on file    Minutes per session: Not on file  . Stress: Not on file  Relationships  . Social Herbalist on phone: Not on file    Gets together: Not on file    Attends religious service: Not on file  Active member of club or organization: Not on file    Attends meetings of clubs or organizations: Not on file    Relationship status: Not on file  . Intimate partner violence    Fear of current or ex partner: Not on file    Emotionally abused: Not on file    Physically abused: Not on file    Forced sexual activity: Not on file  Other Topics Concern  . Not on file  Social History Narrative  . Not on file      Review of Systems  Constitutional: Negative for chills, fatigue and unexpected weight change.  HENT: Positive for sinus pressure and sinus pain. Negative for congestion, rhinorrhea, sneezing and sore throat.   Eyes: Negative for photophobia, pain and redness.  Respiratory: Positive for  cough. Negative for chest tightness and shortness of breath.   Cardiovascular: Negative for chest pain and palpitations.  Gastrointestinal: Negative for abdominal pain, constipation, diarrhea, nausea and vomiting.  Endocrine: Negative.   Genitourinary: Negative for dysuria and frequency.  Musculoskeletal: Negative for arthralgias, back pain, joint swelling and neck pain.  Skin: Negative for rash.  Allergic/Immunologic: Negative.   Neurological: Negative for tremors and numbness.  Hematological: Negative for adenopathy. Does not bruise/bleed easily.  Psychiatric/Behavioral: Negative for behavioral problems and sleep disturbance. The patient is not nervous/anxious.     Vital Signs: Ht 5\' 7"  (1.702 m)   Wt 225 lb (102.1 kg)   BMI 35.24 kg/m    Observation/Objective:  Pt appears ill, NAD noted.  She is obviously congested nasally.    Assessment/Plan: 1. Subacute maxillary sinusitis Advised patient to take entire course of antibiotics as prescribed with food. Pt should return to clinic in 7-10 days if symptoms fail to improve or new symptoms develop.  - azithromycin (ZITHROMAX) 250 MG tablet; Take as directed  Dispense: 6 tablet; Refill: 0  2. Essential hypertension, benign Stable, continue present management.   General Counseling: Lichelle verbalizes understanding of the findings of today's phone visit and agrees with plan of treatment. I have discussed any further diagnostic evaluation that may be needed or ordered today. We also reviewed her medications today. she has been encouraged to call the office with any questions or concerns that should arise related to todays visit.    No orders of the defined types were placed in this encounter.   No orders of the defined types were placed in this encounter.   Time spent: [place] AGNP-C Internal medicine

## 2019-05-02 ENCOUNTER — Ambulatory Visit (INDEPENDENT_AMBULATORY_CARE_PROVIDER_SITE_OTHER): Payer: BC Managed Care – PPO | Admitting: Nurse Practitioner

## 2019-05-02 ENCOUNTER — Encounter: Payer: Self-pay | Admitting: Nurse Practitioner

## 2019-05-02 ENCOUNTER — Other Ambulatory Visit: Payer: Self-pay

## 2019-05-02 VITALS — BP 155/88 | HR 63 | Temp 97.6°F | Resp 16 | Ht 67.0 in | Wt 226.0 lb

## 2019-05-02 DIAGNOSIS — F988 Other specified behavioral and emotional disorders with onset usually occurring in childhood and adolescence: Secondary | ICD-10-CM

## 2019-05-02 DIAGNOSIS — Z0001 Encounter for general adult medical examination with abnormal findings: Secondary | ICD-10-CM

## 2019-05-02 DIAGNOSIS — M545 Low back pain, unspecified: Secondary | ICD-10-CM

## 2019-05-02 DIAGNOSIS — M62838 Other muscle spasm: Secondary | ICD-10-CM

## 2019-05-02 DIAGNOSIS — R300 Dysuria: Secondary | ICD-10-CM | POA: Diagnosis not present

## 2019-05-02 DIAGNOSIS — G8929 Other chronic pain: Secondary | ICD-10-CM

## 2019-05-02 DIAGNOSIS — I10 Essential (primary) hypertension: Secondary | ICD-10-CM | POA: Diagnosis not present

## 2019-05-02 DIAGNOSIS — Z79899 Other long term (current) drug therapy: Secondary | ICD-10-CM | POA: Diagnosis not present

## 2019-05-02 DIAGNOSIS — Z1231 Encounter for screening mammogram for malignant neoplasm of breast: Secondary | ICD-10-CM

## 2019-05-02 LAB — POCT URINE DRUG SCREEN
POC Amphetamine UR: NOT DETECTED
POC BENZODIAZEPINES UR: NOT DETECTED
POC Barbiturate UR: NOT DETECTED
POC Cocaine UR: NOT DETECTED
POC Ecstasy UR: NOT DETECTED
POC Marijuana UR: NOT DETECTED
POC Methadone UR: NOT DETECTED
POC Methamphetamine UR: NOT DETECTED
POC Opiate Ur: NOT DETECTED
POC Oxycodone UR: NOT DETECTED
POC PHENCYCLIDINE UR: NOT DETECTED
POC TRICYCLICS UR: NOT DETECTED

## 2019-05-02 MED ORDER — AMPHETAMINE-DEXTROAMPHETAMINE 20 MG PO TABS: ORAL_TABLET | ORAL | 0 refills | Status: DC

## 2019-05-02 MED ORDER — CYCLOBENZAPRINE HCL 10 MG PO TABS: ORAL_TABLET | ORAL | 0 refills | Status: DC

## 2019-05-02 MED ORDER — TRAMADOL HCL 50 MG PO TABS: ORAL_TABLET | ORAL | 1 refills | Status: DC

## 2019-05-02 NOTE — Progress Notes (Signed)
[facility] [place], [place] [address]  Internal MEDICINE  Office Visit Note  Patient Name: Audrey Ball  [id number]  [id number]  Date of Service: 05/02/2019   Pt is here for routine health maintenance examination   Chief Complaint  Patient presents with  . Annual Exam  . Hypertension     The patient is here for health maintenance exam. She is complaining of left knee pain with swelling. She states that it hurts. She can't wear shoes like she wants to wear. State that orthopedics wants to do surgery on her left knee. Want to scrape arthritis. She is not ready to have this done at this point. Would need to take time off work and she isn't ready to do that. Since surgery recommended, she has not been back ot orthopedics. She states that she is also having low back pain. This is most severe when she has been on her feet for long periods of time. In the past, she has taken muscle relaxer, which helps to release tight muscles and helps her to rest better.  Blood pressure is mildly elevated today. Has not taken blood pressure medication yet today. She has not taken adderall or other morning medication. She does need to have refills for this.      Current Medication: Outpatient Encounter Medications as of 05/02/2019  Medication Sig  . amphetamine-dextroamphetamine (ADDERALL) 20 MG tablet TAKE 1 TABLET BY MOUTH TWICE A DAY AS NEEDED FOR FOCUS  . betamethasone valerate ointment (VALISONE) 0.1 % Apply 1 application topically 2 (two) times daily.  . hydrochlorothiazide (HYDRODIURIL) 25 MG tablet Take 1 tablet (25 mg total) by mouth daily.  . meloxicam (MOBIC) 15 MG tablet TAKE 1 TABLET BY MOUTH ONCE DAY WITH MEAL  . traMADol (ULTRAM) 50 MG tablet Take 1 tablet po Q4 to 6 hours as needed for pain  . valACYclovir (VALTREX) 1000 MG tablet Take 1 tablet (1,000 mg total) by mouth 2 (two) times daily.  . [DISCONTINUED] amphetamine-dextroamphetamine (ADDERALL) 20 MG  tablet TAKE 1 TABLET BY MOUTH TWICE A DAY AS NEEDED FOR FOCUS  . [DISCONTINUED] amphetamine-dextroamphetamine (ADDERALL) 20 MG tablet TAKE 1 TABLET BY MOUTH TWICE A DAY AS NEEDED FOR FOCUS  . [DISCONTINUED] amphetamine-dextroamphetamine (ADDERALL) 20 MG tablet TAKE 1 TABLET BY MOUTH TWICE A DAY AS NEEDED FOR FOCUS  . [DISCONTINUED] traMADol (ULTRAM) 50 MG tablet Take one tab po qd prn for pain  . cyclobenzaprine (FLEXERIL) 10 MG tablet Take 1/2 to 1 tablet po QHS prn back pain  . [DISCONTINUED] azithromycin (ZITHROMAX) 250 MG tablet Take as directed (Patient not taking: Reported on 05/02/2019)   No facility-administered encounter medications on file as of 05/02/2019.     Surgical History: Past Surgical History:  Procedure Laterality Date  . BACK SURGERY  2017  . GALLBLADDER SURGERY  1999  . TOTAL VAGINAL HYSTERECTOMY  2016   bilateral [facility] at [facility] for abnormal uterine bleeding    Medical History: Past Medical History:  Diagnosis Date  . Hypertension     Family History: Family History  Problem Relation Age of Onset  . Leukemia Mother   . Hypertension Mother   . Diabetes Mother   . Lung cancer Father       Review of Systems  Constitutional: Negative for chills, fatigue and unexpected weight change.  HENT: Negative for congestion, facial swelling, postnasal drip, rhinorrhea, sneezing and sore throat.   Respiratory: Negative for cough, chest tightness, shortness of breath and wheezing.  Cardiovascular: Negative for chest pain and palpitations.       Blood pressure mildly elevated today. She has not taken her medication yet today.  Gastrointestinal: Negative for abdominal pain, constipation, diarrhea, nausea and vomiting.  Endocrine: Negative for cold intolerance, heat intolerance, polydipsia and polyuria.  Genitourinary: Negative for dysuria, frequency and urgency.  Musculoskeletal: Positive for arthralgias, back pain and myalgias. Negative for joint swelling and neck  pain.       Left knee pain and swelling.  Skin: Negative for rash.  Allergic/Immunologic: Negative for environmental allergies.  Neurological: Negative for dizziness, tremors, numbness and headaches.  Hematological: Negative for adenopathy. Does not bruise/bleed easily.  Psychiatric/Behavioral: Positive for decreased concentration. Negative for behavioral problems (Depression), sleep disturbance and suicidal ideas. The patient is not nervous/anxious.      Today's Vitals   05/02/19 0939  BP: (!) 155/88  Pulse: 63  Resp: 16  Temp: 97.6 F (36.4 C)  SpO2: 95%  Weight: 226 lb (102.5 kg)  Height: 5\' 7"  (1.702 m)   Body mass index is 35.4 kg/m.  Physical Exam Vitals signs and nursing note reviewed.  Constitutional:      General: She is not in acute distress.    Appearance: Normal appearance. She is well-developed. She is not diaphoretic.  HENT:     Head: Normocephalic and atraumatic.     Mouth/Throat:     Mouth: Mucous membranes are moist. No angioedema.     Pharynx: Oropharynx is clear. No oropharyngeal exudate.  Eyes:     Extraocular Movements: Extraocular movements intact.     Conjunctiva/sclera: Conjunctivae normal.     Pupils: Pupils are equal, round, and reactive to light.  Neck:     Musculoskeletal: Normal range of motion and neck supple.     Thyroid: No thyromegaly.     Vascular: No JVD.     Trachea: No tracheal deviation.  Cardiovascular:     Rate and Rhythm: Normal rate and regular rhythm.     Heart sounds: Normal heart sounds. No murmur. No friction rub. No gallop.   Pulmonary:     Effort: Pulmonary effort is normal. No respiratory distress.     Breath sounds: Normal breath sounds. No wheezing or rales.  Chest:     Chest wall: No tenderness.     Breasts:        Right: Normal. No swelling, bleeding, inverted nipple, mass, nipple discharge, skin change or tenderness.        Left: Normal. No swelling, bleeding, inverted nipple, mass, nipple discharge, skin  change or tenderness.  Abdominal:     General: Bowel sounds are normal.     Palpations: Abdomen is soft.  Musculoskeletal: Normal range of motion.  Lymphadenopathy:     Cervical: No cervical adenopathy.  Skin:    General: Skin is warm and dry.  Neurological:     Mental Status: She is alert and oriented to person, place, and time.     Cranial Nerves: No cranial nerve deficit.  Psychiatric:        Behavior: Behavior normal.        Thought Content: Thought content normal.        Judgment: Judgment normal.    Assessment/Plan: 1. Encounter for general adult medical examination with abnormal findings Annual health maintennce exam today   2. Essential hypertension, benign Generally stable. Elevated today as she has not taken BP medication yet. Continue BP medication as prescribed.    3. Chronic bilateral low back pain without  sciatica Patient may take tramadol 1 time daily if needed for back pain and knee pain which is not relieved by NSAID,  - traMADol (ULTRAM) 50 MG tablet; Take 1 tablet po Q4 to 6 hours as needed for pain  Dispense: 30 tablet; Refill: 1  4. Night muscle spasms Flexeril 10mg  may be taken at bedtime as needed for muscle pain/spasms.  - cyclobenzaprine (FLEXERIL) 10 MG tablet; Take 1/2 to 1 tablet po QHS prn back pain  Dispense: 30 tablet; Refill: 0  5. Encounter for screening mammogram for malignant neoplasm of breast - scrrening mammo; Future  6. Attention deficit disorder of adult May coitnue to take adderall 20mg  twice daily when needed for focus, three 30 day prescriptions provided. Dates are 05/02/2019, 05/30/2019, and 06/28/2019 - amphetamine-dextroamphetamine (ADDERALL) 20 MG tablet; TAKE 1 TABLET BY MOUTH TWICE A DAY AS NEEDED FOR FOCUS  Dispense: 60 tablet; Refill: 0  7. Encounter for long-term (current) use of medications - POCT Urine Drug Screen negative for all controlled substances which is appropriate as she takes only on as needed basis.   8.  Dysuria - UA/M w/rflx Culture, Routine  General Counseling: Zarria verbalizes understanding of the findings of todays visit and agrees with plan of treatment. I have discussed any further diagnostic evaluation that may be needed or ordered today. We also reviewed her medications today. she has been encouraged to call the office with any questions or concerns that should arise related to todays visit.    Counseling:  Hypertension Counseling:   The following hypertensive lifestyle modification were recommended and discussed:  1. Limiting alcohol intake to less than 1 oz/day of ethanol:(24 oz of beer or 8 oz of wine or 2 oz of 100-proof whiskey). 2. Take baby ASA 81 mg daily. 3. Importance of regular aerobic exercise and losing weight. 4. Reduce dietary saturated fat and cholesterol intake for overall cardiovascular health. 5. Maintaining adequate dietary potassium, calcium, and magnesium intake. 6. Regular monitoring of the blood pressure. 7. Reduce sodium intake to less than 100 mmol/day (less than 2.3 gm of sodium or less than 6 gm of sodium choride)   Refilled Controlled medications today. Reviewed risks and possible side effects associated with taking Stimulants. Combination of these drugs with other psychotropic medications could cause dizziness and drowsiness. Pt needs to Monitor symptoms and exercise caution in driving and operating heavy machinery to avoid damages to oneself, to others and to the surroundings. Patient verbalized understanding in this matter. Dependence and abuse for these drugs will be monitored closely. A Controlled substance policy and procedure is on file which allows [facility] to order a urine drug screen test at any visit. Patient understands and agrees with the plan..  This patient was seen by Leretha Pol FNP Collaboration with Dr Lavera Guise as a part of collaborative care agreement  Orders Placed This Encounter  Procedures  . scrrening mammo   . UA/M w/rflx Culture, Routine  . POCT Urine Drug Screen    Meds ordered this encounter  Medications  . DISCONTD: amphetamine-dextroamphetamine (ADDERALL) 20 MG tablet    Sig: TAKE 1 TABLET BY MOUTH TWICE A DAY AS NEEDED FOR FOCUS    Dispense:  60 tablet    Refill:  0    Order Specific Question:   Supervising Provider    Answer:   Lavera Guise [id number]  . traMADol (ULTRAM) 50 MG tablet    Sig: Take 1 tablet po Q4 to 6 hours as needed for  pain    Dispense:  30 tablet    Refill:  1    Order Specific Question:   Supervising Provider    Answer:   Lavera Guise [id number]  . DISCONTD: amphetamine-dextroamphetamine (ADDERALL) 20 MG tablet    Sig: TAKE 1 TABLET BY MOUTH TWICE A DAY AS NEEDED FOR FOCUS    Dispense:  60 tablet    Refill:  0    Fill after 05/30/2019    Order Specific Question:   Supervising Provider    Answer:   Lavera Guise [id number]  . amphetamine-dextroamphetamine (ADDERALL) 20 MG tablet    Sig: TAKE 1 TABLET BY MOUTH TWICE A DAY AS NEEDED FOR FOCUS    Dispense:  60 tablet    Refill:  0    Fill after 06/28/2019    Order Specific Question:   Supervising Provider    Answer:   Lavera Guise [place]  . cyclobenzaprine (FLEXERIL) 10 MG tablet    Sig: Take 1/2 to 1 tablet po QHS prn back pain    Dispense:  30 tablet    Refill:  0    Order Specific Question:   Supervising Provider    Answer:   Lavera Guise [id number]    Time spent: [place], MD  Internal Medicine

## 2019-05-03 LAB — MICROSCOPIC EXAMINATION
Casts: NONE SEEN /lpf
RBC, Urine: NONE SEEN /hpf (ref 0–2)

## 2019-05-03 LAB — UA/M W/RFLX CULTURE, ROUTINE
Bilirubin, UA: NEGATIVE
Glucose, UA: NEGATIVE
Ketones, UA: NEGATIVE
Leukocytes,UA: NEGATIVE
Nitrite, UA: NEGATIVE
RBC, UA: NEGATIVE
Specific Gravity, UA: 1.028 (ref 1.005–1.030)
Urobilinogen, Ur: 1 mg/dL (ref 0.2–1.0)
pH, UA: 8 — ABNORMAL HIGH (ref 5.0–7.5)

## 2019-06-06 DIAGNOSIS — Z20828 Contact with and (suspected) exposure to other viral communicable diseases: Secondary | ICD-10-CM | POA: Diagnosis not present

## 2019-06-08 ENCOUNTER — Telehealth: Payer: Self-pay

## 2019-06-08 NOTE — Telephone Encounter (Signed)
Confirmed appointment with patient. klh °

## 2019-06-09 ENCOUNTER — Ambulatory Visit: Payer: BC Managed Care – PPO | Admitting: Nurse Practitioner

## 2019-06-09 ENCOUNTER — Encounter: Payer: Self-pay | Admitting: Nurse Practitioner

## 2019-06-09 ENCOUNTER — Other Ambulatory Visit: Payer: Self-pay

## 2019-06-09 DIAGNOSIS — U071 COVID-19: Secondary | ICD-10-CM | POA: Diagnosis not present

## 2019-06-09 DIAGNOSIS — J0140 Acute pansinusitis, unspecified: Secondary | ICD-10-CM | POA: Diagnosis not present

## 2019-06-09 MED ORDER — AZITHROMYCIN 250 MG PO TABS: ORAL_TABLET | ORAL | 0 refills | Status: DC

## 2019-06-09 NOTE — Progress Notes (Signed)
[facility] [place], [place] [address]  Internal MEDICINE  Telephone Visit  Patient Name: Audrey LARE  [id number]  [id number]  Date of Service: 06/09/2019  I connected with the patient at 4:23pm by webcam and verified the patients identity using two identifiers.   I discussed the limitations, risks, security and privacy concerns of performing an evaluation and management service by webcam and the availability of in person appointments. I also discussed with the patient that there may be a patient responsible charge related to the service.  The patient expressed understanding and agrees to proceed.    Chief Complaint  Patient presents with  . Telephone Assessment    covid positive done by [facility]  . Telephone Screen  . Nasal Congestion  . Medical Management of Chronic Issues    no smell and taste     The patient has been contacted via webcam for follow up visit due to concerns for spread of novel coronavirus. The patient states that she has lost sense of taste and smell. She has some mild nasal congestion. She denies sore throat, fever, body aches, or chills. This has been of concern for the past week. She did have COVID test which came back positive on Tuesday. She has been contacted per health department regarding hoe isolation precautions.       Current Medication: Outpatient Encounter Medications as of 06/09/2019  Medication Sig  . amphetamine-dextroamphetamine (ADDERALL) 20 MG tablet TAKE 1 TABLET BY MOUTH TWICE A DAY AS NEEDED FOR FOCUS  . betamethasone valerate ointment (VALISONE) 0.1 % Apply 1 application topically 2 (two) times daily.  . cyclobenzaprine (FLEXERIL) 10 MG tablet Take 1/2 to 1 tablet po QHS prn back pain  . hydrochlorothiazide (HYDRODIURIL) 25 MG tablet Take 1 tablet (25 mg total) by mouth daily.  . meloxicam (MOBIC) 15 MG tablet TAKE 1 TABLET BY MOUTH ONCE DAY WITH MEAL  . traMADol (ULTRAM) 50 MG tablet Take 1 tablet po Q4 to 6  hours as needed for pain  . valACYclovir (VALTREX) 1000 MG tablet Take 1 tablet (1,000 mg total) by mouth 2 (two) times daily.  Marland Kitchen azithromycin (ZITHROMAX) 250 MG tablet z-pack - take as directed for 5 days for URI related to [place]   No facility-administered encounter medications on file as of 06/09/2019.    Surgical History: Past Surgical History:  Procedure Laterality Date  . BACK SURGERY  2017  . GALLBLADDER SURGERY  1999  . TOTAL VAGINAL HYSTERECTOMY  2016   bilateral salpingectomy at [facility] for abnormal uterine bleeding    Medical History: Past Medical History:  Diagnosis Date  . Hypertension     Family History: Family History  Problem Relation Age of Onset  . Leukemia Mother   . Hypertension Mother   . Diabetes Mother   . Lung cancer Father     Social History   Socioeconomic History  . Marital status: Single    Spouse name: Not on file  . Number of children: Not on file  . Years of education: Not on file  . Highest education level: Not on file  Occupational History  . Not on file  Tobacco Use  . Smoking status: Never Smoker  . Smokeless tobacco: Never Used  Substance and Sexual Activity  . Alcohol use: No  . Drug use: No  . Sexual activity: Yes    Birth control/protection: Surgical  Other Topics Concern  . Not on file  Social History Narrative  . Not on  file   Social Determinants of Health   Financial Resource Strain:   . Difficulty of Paying Living Expenses: Not on file  Food Insecurity:   . Worried About Charity fundraiser in the Last Year: Not on file  . Ran Out of Food in the Last Year: Not on file  Transportation Needs:   . Lack of Transportation (Medical): Not on file  . Lack of Transportation (Non-Medical): Not on file  Physical Activity:   . Days of Exercise per Week: Not on file  . Minutes of Exercise per Session: Not on file  Stress:   . Feeling of Stress : Not on file  Social Connections:   . Frequency of Communication with Friends  and Family: Not on file  . Frequency of Social Gatherings with Friends and Family: Not on file  . Attends Religious Services: Not on file  . Active Member of Clubs or Organizations: Not on file  . Attends Archivist Meetings: Not on file  . Marital Status: Not on file  Intimate Partner Violence:   . Fear of Current or Ex-Partner: Not on file  . Emotionally Abused: Not on file  . Physically Abused: Not on file  . Sexually Abused: Not on file      Review of Systems  Constitutional: Positive for fatigue. Negative for chills, fever and unexpected weight change.  HENT: Positive for congestion, postnasal drip and sinus pressure. Negative for rhinorrhea, sneezing and sore throat.   Respiratory: Negative for cough, chest tightness, shortness of breath and wheezing.   Cardiovascular: Negative for chest pain and palpitations.  Gastrointestinal: Negative for abdominal pain, constipation, diarrhea, nausea and vomiting.  Musculoskeletal: Negative for arthralgias, back pain, joint swelling, myalgias and neck pain.  Skin: Negative for rash.  Neurological: Negative for dizziness, tremors, numbness and headaches.  Hematological: Negative for adenopathy. Does not bruise/bleed easily.  Psychiatric/Behavioral: Negative for behavioral problems (Depression), sleep disturbance and suicidal ideas. The patient is not nervous/anxious.     Vital Signs: There were no vitals taken for this visit.   Observation/Objective:   The patient is alert and oriented. She is pleasant and answers all questions appropriately. Breathing is non-labored. She is in no acute distress at this time.  The patient is nasally congested. She appears to feel poorly.    Assessment/Plan: 1. Acute non-recurrent pansinusitis Start z-pack. Take as directed for 5 days. Rest and increase fluids. Treat acute symptoms with OTC medications as needed and as indicated.  - azithromycin (ZITHROMAX) 250 MG tablet; z-pack - take as  directed for 5 days for URI related to COVID19  Dispense: 6 tablet; Refill: 0  2. COVID-19 Start z-pack. Take as directed for 5 days. Rest and increase fluids. Treat acute symptoms with OTC medications as needed and as indicated.  - azithromycin (ZITHROMAX) 250 MG tablet; z-pack - take as directed for 5 days for URI related to COVID19  Dispense: 6 tablet; Refill: 0     Person Under Monitoring Name: Lester Kinsman  Location: [place] [address]   Infection Prevention Recommendations for Individuals Confirmed to have, or Being Evaluated for, 2019 Novel Coronavirus (COVID-19) Infection Who Receive Care at Home  Individuals who are confirmed to have, or are being evaluated for, COVID-19 should follow the prevention steps below until a healthcare provider or local or state health department says they can return to normal activities.  Stay home except to get medical care You should restrict activities outside your home,  except for getting medical care. Do not go to work, school, or public areas, and do not use public transportation or taxis.  Call ahead before visiting your doctor Before your medical appointment, call the healthcare provider and tell them that you have, or are being evaluated for, COVID-19 infection. This will help the healthcare provider's office take steps to keep other people from getting infected. Ask your healthcare provider to call the local or state health department.  Monitor your symptoms Seek prompt medical attention if your illness is worsening (e.g., difficulty breathing). Before going to your medical appointment, call the healthcare provider and tell them that you have, or are being evaluated for, COVID-19 infection. Ask your healthcare provider to call the local or state health department.  Wear a facemask You should wear a facemask that covers your nose and mouth when you are in the same room with other people and when you  visit a healthcare provider. People who live with or visit you should also wear a facemask while they are in the same room with you.  Separate yourself from other people in your home As much as possible, you should stay in a different room from other people in your home. Also, you should use a separate bathroom, if available.  Avoid sharing household items You should not share dishes, drinking glasses, cups, eating utensils, towels, bedding, or other items with other people in your home. After using these items, you should wash them thoroughly with soap and water.  Cover your coughs and sneezes Cover your mouth and nose with a tissue when you cough or sneeze, or you can cough or sneeze into your sleeve. Throw used tissues in a lined trash can, and immediately wash your hands with soap and water for at least 20 seconds or use an alcohol-based hand rub.  Wash your [facility] your hands often and thoroughly with soap and water for at least 20 seconds. You can use an alcohol-based hand sanitizer if soap and water are not available and if your hands are not visibly dirty. Avoid touching your eyes, nose, and mouth with unwashed hands.   Prevention Steps for Caregivers and Household Members of Individuals Confirmed to have, or Being Evaluated for, COVID-19 Infection Being Cared for in the Home  If you live with, or provide care at home for, a person confirmed to have, or being evaluated for, COVID-19 infection please follow these guidelines to prevent infection:  Follow healthcare provider's instructions Make sure that you understand and can help the patient follow any healthcare provider instructions for all care.  Provide for the patient's basic needs You should help the patient with basic needs in the home and provide support for getting groceries, prescriptions, and other personal needs.  Monitor the patient's symptoms If they are getting sicker, call his or her medical provider and  tell them that the patient has, or is being evaluated for, COVID-19 infection. This will help the healthcare provider's office take steps to keep other people from getting infected. Ask the healthcare provider to call the local or state health department.  Limit the number of people who have contact with the patient  If possible, have only one caregiver for the patient.  Other household members should stay in another home or place of residence. If this is not possible, they should stay  in another room, or be separated from the patient as much as possible. Use a separate bathroom, if available.  Restrict visitors who do not have  an essential need to be in the home.  Keep older adults, very young children, and other sick people away from the patient Keep older adults, very young children, and those who have compromised immune systems or chronic health conditions away from the patient. This includes people with chronic heart, lung, or kidney conditions, diabetes, and cancer.  Ensure good ventilation Make sure that shared spaces in the home have good air flow, such as from an air conditioner or an opened window, weather permitting.  Wash your hands often  Wash your hands often and thoroughly with soap and water for at least 20 seconds. You can use an alcohol based hand sanitizer if soap and water are not available and if your hands are not visibly dirty.  Avoid touching your eyes, nose, and mouth with unwashed hands.  Use disposable paper towels to dry your hands. If not available, use dedicated cloth towels and replace them when they become wet.  Wear a facemask and gloves  Wear a disposable facemask at all times in the room and gloves when you touch or have contact with the patient's blood, body fluids, and/or secretions or excretions, such as sweat, saliva, sputum, nasal mucus, vomit, urine, or feces.  Ensure the mask fits over your nose and mouth tightly, and do not touch it during  use.  Throw out disposable facemasks and gloves after using them. Do not reuse.  Wash your hands immediately after removing your facemask and gloves.  If your personal clothing becomes contaminated, carefully remove clothing and launder. Wash your hands after handling contaminated clothing.  Place all used disposable facemasks, gloves, and other waste in a lined container before disposing them with other household waste.  Remove gloves and wash your hands immediately after handling these items.  Do not share dishes, glasses, or other household items with the patient  Avoid sharing household items. You should not share dishes, drinking glasses, cups, eating utensils, towels, bedding, or other items with a patient who is confirmed to have, or being evaluated for, COVID-19 infection.  After the person uses these items, you should wash them thoroughly with soap and water.  Wash laundry thoroughly  Immediately remove and wash clothes or bedding that have blood, body fluids, and/or secretions or excretions, such as sweat, saliva, sputum, nasal mucus, vomit, urine, or feces, on them.  Wear gloves when handling laundry from the patient.  Read and follow directions on labels of laundry or clothing items and detergent. In general, wash and dry with the warmest temperatures recommended on the label.  Clean all areas the individual has used often  Clean all touchable surfaces, such as counters, tabletops, doorknobs, bathroom fixtures, toilets, phones, keyboards, tablets, and bedside tables, every day. Also, clean any surfaces that may have blood, body fluids, and/or secretions or excretions on them.  Wear gloves when cleaning surfaces the patient has come in contact with.  Use a diluted bleach solution (e.g., dilute bleach with 1 part bleach and 10 parts water) or a household disinfectant with a label that says EPA-registered for coronaviruses. To make a bleach solution at home, add 1 tablespoon  of bleach to 1 quart (4 cups) of water. For a larger supply, add  cup of bleach to 1 gallon (16 cups) of water.  Read labels of cleaning products and follow recommendations provided on product labels. Labels contain instructions for safe and effective use of the cleaning product including precautions you should take when applying the product, such as wearing gloves or  eye protection and making sure you have good ventilation during use of the product.  Remove gloves and wash hands immediately after cleaning.  Monitor yourself for signs and symptoms of illness Caregivers and household members are considered close contacts, should monitor their health, and will be asked to limit movement outside of the home to the extent possible. Follow the monitoring steps for close contacts listed on the symptom monitoring form.   ? If you have additional questions, contact your local health department or call the epidemiologist on call at [phone] (available 24/7). ? This guidance is subject to change. For the most up-to-date guidance from [facility], please refer to their website: [email or web address]  General Counseling: Leianne verbalizes understanding of the findings of today's phone visit and agrees with plan of treatment. I have discussed any further diagnostic evaluation that may be needed or ordered today. We also reviewed her medications today. she has been encouraged to call the office with any questions or concerns that should arise related to todays visit.  This patient was seen by Delia with Dr Lavera Guise as a part of collaborative care agreement  Meds ordered this encounter  Medications  . azithromycin (ZITHROMAX) 250 MG tablet    Sig: z-pack - take as directed for 5 days for URI related to [place]:  6 tablet    Refill:  0    Order Specific Question:   Supervising Provider    Answer:   Lavera Guise  [id number]    Time spent: 22 Minutes    Dr Lavera Guise Internal medicine

## 2019-07-03 ENCOUNTER — Other Ambulatory Visit: Payer: Self-pay | Admitting: Internal Medicine

## 2019-07-03 DIAGNOSIS — I10 Essential (primary) hypertension: Secondary | ICD-10-CM

## 2019-08-01 ENCOUNTER — Ambulatory Visit: Payer: BC Managed Care – PPO | Admitting: Nurse Practitioner

## 2019-08-01 ENCOUNTER — Encounter: Payer: Self-pay | Admitting: Nurse Practitioner

## 2019-08-01 VITALS — Ht 67.0 in | Wt 220.0 lb

## 2019-08-01 DIAGNOSIS — G8929 Other chronic pain: Secondary | ICD-10-CM

## 2019-08-01 DIAGNOSIS — M545 Low back pain, unspecified: Secondary | ICD-10-CM

## 2019-08-01 DIAGNOSIS — F988 Other specified behavioral and emotional disorders with onset usually occurring in childhood and adolescence: Secondary | ICD-10-CM | POA: Diagnosis not present

## 2019-08-01 DIAGNOSIS — I10 Essential (primary) hypertension: Secondary | ICD-10-CM

## 2019-08-01 MED ORDER — AMPHETAMINE-DEXTROAMPHETAMINE 20 MG PO TABS: ORAL_TABLET | ORAL | 0 refills | Status: DC

## 2019-08-01 MED ORDER — TRAMADOL HCL 50 MG PO TABS: ORAL_TABLET | ORAL | 2 refills | Status: DC

## 2019-08-01 NOTE — Progress Notes (Signed)
[facility] [place], [place] [address]  Internal MEDICINE  Telephone Visit  Patient Name: Audrey Ball  [id number]  [id number]  Date of Service: 08/01/2019  I connected with the patient at 12:34pm by webcam and verified the patients identity using two identifiers.   I discussed the limitations, risks, security and privacy concerns of performing an evaluation and management service by webcam and the availability of in person appointments. I also discussed with the patient that there may be a patient responsible charge related to the service.  The patient expressed understanding and agrees to proceed.    Chief Complaint  Patient presents with  . Telephone Assessment  . Telephone Screen  . Follow-up  . Hypertension  . Blurred Vision    The patient has been contacted via webcam for follow up visit due to concerns for spread of novel coronavirus. The patient presents for routine follow up. The patient states that she has been getting blurry vision, mostly when she is looking at things up close. She has seen the eye doctor. Told her she needed to go up in strength of her readers.  The patient is c/o problems with focus and concentration at work. 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 Is taking Adderall 20mg  twice daily while at work. Keeps her focused and on track. She reports no negative side effects from taking this medication. She needs to have refills today.   She has persistent left knee pain. She has seen orthopedics, but not right now. Their recommendation is surgery is she is not ready for this step. She takes meloxicam most days. When needed, she will take tramadol, especially after working long shift and being on her feet for several hours. She needs refills for this also.        Current Medication: Outpatient Encounter Medications as of 08/01/2019   Medication Sig  . betamethasone valerate ointment (VALISONE) 0.1 % Apply 1 application topically 2 (two) times daily.  . cyclobenzaprine (FLEXERIL) 10 MG tablet Take 1/2 to 1 tablet po QHS prn back pain  . hydrochlorothiazide (HYDRODIURIL) 25 MG tablet TAKE 1 TABLET BY MOUTH EVERY DAY  . meloxicam (MOBIC) 15 MG tablet TAKE 1 TABLET BY MOUTH ONCE DAY WITH MEAL  . traMADol (ULTRAM) 50 MG tablet Take 1 tablet po Q4 to 6 hours as needed for pain  . valACYclovir (VALTREX) 1000 MG tablet Take 1 tablet (1,000 mg total) by mouth 2 (two) times daily.  . [DISCONTINUED] amphetamine-dextroamphetamine (ADDERALL) 20 MG tablet TAKE 1 TABLET BY MOUTH TWICE A DAY AS NEEDED FOR FOCUS  . [DISCONTINUED] amphetamine-dextroamphetamine (ADDERALL) 20 MG tablet TAKE 1 TABLET BY MOUTH TWICE A DAY AS NEEDED FOR FOCUS  . [DISCONTINUED] amphetamine-dextroamphetamine (ADDERALL) 20 MG tablet TAKE 1 TABLET BY MOUTH TWICE A DAY AS NEEDED FOR FOCUS  . [DISCONTINUED] azithromycin (ZITHROMAX) 250 MG tablet z-pack - take as directed for 5 days for URI related to [place]  . [DISCONTINUED] traMADol (ULTRAM) 50 MG tablet Take 1 tablet po Q4 to 6 hours as needed for pain  . amphetamine-dextroamphetamine (ADDERALL) 20 MG tablet TAKE 1 TABLET BY MOUTH TWICE A DAY AS NEEDED FOR FOCUS   No facility-administered encounter medications on file as of 08/01/2019.    Surgical History: Past Surgical History:  Procedure Laterality Date  . BACK SURGERY  2017  . GALLBLADDER SURGERY  1999  .  TOTAL VAGINAL HYSTERECTOMY  2016   bilateral salpingectomy at [facility] for abnormal uterine bleeding    Medical History: Past Medical History:  Diagnosis Date  . Hypertension     Family History: Family History  Problem Relation Age of Onset  . Leukemia Mother   . Hypertension Mother   . Diabetes Mother   . Lung cancer Father     Social History   Socioeconomic History  . Marital status: Single    Spouse name: Not on file  . Number of children:  Not on file  . Years of education: Not on file  . Highest education level: Not on file  Occupational History  . Not on file  Tobacco Use  . Smoking status: Never Smoker  . Smokeless tobacco: Never Used  Substance and Sexual Activity  . Alcohol use: No  . Drug use: No  . Sexual activity: Yes    Birth control/protection: Surgical  Other Topics Concern  . Not on file  Social History Narrative  . Not on file   Social Determinants of Health   Financial Resource Strain:   . Difficulty of Paying Living Expenses: Not on file  Food Insecurity:   . Worried About Charity fundraiser in the Last Year: Not on file  . Ran Out of Food in the Last Year: Not on file  Transportation Needs:   . Lack of Transportation (Medical): Not on file  . Lack of Transportation (Non-Medical): Not on file  Physical Activity:   . Days of Exercise per Week: Not on file  . Minutes of Exercise per Session: Not on file  Stress:   . Feeling of Stress : Not on file  Social Connections:   . Frequency of Communication with Friends and Family: Not on file  . Frequency of Social Gatherings with Friends and Family: Not on file  . Attends Religious Services: Not on file  . Active Member of Clubs or Organizations: Not on file  . Attends Archivist Meetings: Not on file  . Marital Status: Not on file  Intimate Partner Violence:   . Fear of Current or Ex-Partner: Not on file  . Emotionally Abused: Not on file  . Physically Abused: Not on file  . Sexually Abused: Not on file      Review of Systems  Constitutional: Negative for chills, fatigue and unexpected weight change.  HENT: Negative for congestion, facial swelling, postnasal drip, rhinorrhea, sneezing and sore throat.   Eyes: Positive for visual disturbance.       Blurry vision when looking at this up close.   Respiratory: Negative for cough, chest tightness, shortness of breath and wheezing.   Cardiovascular: Negative for chest pain and  palpitations.  Gastrointestinal: Negative for abdominal pain, constipation, diarrhea, nausea and vomiting.  Endocrine: Negative for cold intolerance, heat intolerance, polydipsia and polyuria.  Genitourinary: Negative for dysuria, frequency and urgency.  Musculoskeletal: Positive for arthralgias, back pain and myalgias. Negative for joint swelling and neck pain.       Left knee pain and swelling.  Skin: Negative for rash.  Allergic/Immunologic: Negative for environmental allergies.  Neurological: Negative for dizziness, tremors, numbness and headaches.  Hematological: Negative for adenopathy. Does not bruise/bleed easily.  Psychiatric/Behavioral: Positive for decreased concentration. Negative for behavioral problems (Depression), sleep disturbance and suicidal ideas. The patient is not nervous/anxious.     Today's Vitals   08/01/19 1000  Weight: 220 lb (99.8 kg)  Height: 5\' 7"  (1.702 m)   Body mass  index is 34.46 kg/m.  Observation/Objective:   The patient is alert and oriented. She is pleasant and answers all questions appropriately. Breathing is non-labored. She is in no acute distress at this time.   Assessment/Plan: 1. Essential hypertension, benign Stable. Continue bp medication as prescribed   2. Chronic bilateral low back pain without sciatica Continue meloxicam to reduce pain and inflammaitn. May take tramadol as needed and as prescribed for severe pain. New prescription sent to pharmacy today.  - traMADol (ULTRAM) 50 MG tablet; Take 1 tablet po Q4 to 6 hours as needed for pain  Dispense: 30 tablet; Refill: 2  3. Attention deficit disorder of adult May continue adderall 20mg  twice daily as needed for focus and concentration. Three 30 day prescriptions sent to her pharmacy. Dates are 08/01/2019, 08/29/2019, and 09/27/2019.  - amphetamine-dextroamphetamine (ADDERALL) 20 MG tablet; TAKE 1 TABLET BY MOUTH TWICE A DAY AS NEEDED FOR FOCUS  Dispense: 60 tablet; Refill: 0  General  Counseling: Leialoha verbalizes understanding of the findings of today's phone visit and agrees with plan of treatment. I have discussed any further diagnostic evaluation that may be needed or ordered today. We also reviewed her medications today. she has been encouraged to call the office with any questions or concerns that should arise related to todays visit.   Refilled Controlled medications today. Reviewed risks and possible side effects associated with taking Stimulants. Combination of these drugs with other psychotropic medications could cause dizziness and drowsiness. Pt needs to Monitor symptoms and exercise caution in driving and operating heavy machinery to avoid damages to oneself, to others and to the surroundings. Patient verbalized understanding in this matter. Dependence and abuse for these drugs will be monitored closely. A Controlled substance policy and procedure is on file which allows [facility] to order a urine drug screen test at any visit. Patient understands and agrees with the plan..  This patient was seen by Leretha Pol FNP Collaboration with Dr Lavera Guise as a part of collaborative care agreement  Meds ordered this encounter  Medications  . DISCONTD: amphetamine-dextroamphetamine (ADDERALL) 20 MG tablet    Sig: TAKE 1 TABLET BY MOUTH TWICE A DAY AS NEEDED FOR FOCUS    Dispense:  60 tablet    Refill:  0    Order Specific Question:   Supervising Provider    Answer:   Lavera Guise [id number]  . traMADol (ULTRAM) 50 MG tablet    Sig: Take 1 tablet po Q4 to 6 hours as needed for pain    Dispense:  30 tablet    Refill:  2    Order Specific Question:   Supervising Provider    Answer:   Lavera Guise [place]  . DISCONTD: amphetamine-dextroamphetamine (ADDERALL) 20 MG tablet    Sig: TAKE 1 TABLET BY MOUTH TWICE A DAY AS NEEDED FOR FOCUS    Dispense:  60 tablet    Refill:  0    Fill after 08/29/2019    Order Specific Question:   Supervising Provider    Answer:    Lavera Guise [place]  . amphetamine-dextroamphetamine (ADDERALL) 20 MG tablet    Sig: TAKE 1 TABLET BY MOUTH TWICE A DAY AS NEEDED FOR FOCUS    Dispense:  60 tablet    Refill:  0    Fill after 09/27/2019    Order Specific Question:   Supervising Provider    Answer:   Lavera Guise [id number]    Time spent: 20 Minutes  Dr Lavera Guise Internal medicine

## 2019-10-31 ENCOUNTER — Ambulatory Visit: Payer: BC Managed Care – PPO | Admitting: Nurse Practitioner

## 2019-11-02 ENCOUNTER — Telehealth: Payer: Self-pay

## 2019-11-02 NOTE — Telephone Encounter (Signed)
Lmom for 11-07-19 ov.

## 2019-11-07 ENCOUNTER — Other Ambulatory Visit: Payer: Self-pay

## 2019-11-07 ENCOUNTER — Encounter: Payer: Self-pay | Admitting: Nurse Practitioner

## 2019-11-07 ENCOUNTER — Ambulatory Visit: Payer: BC Managed Care – PPO | Admitting: Nurse Practitioner

## 2019-11-07 VITALS — BP 140/84 | HR 72 | Temp 96.9°F | Resp 16 | Ht 67.0 in | Wt 225.0 lb

## 2019-11-07 DIAGNOSIS — M722 Plantar fascial fibromatosis: Secondary | ICD-10-CM | POA: Diagnosis not present

## 2019-11-07 DIAGNOSIS — M25562 Pain in left knee: Secondary | ICD-10-CM | POA: Diagnosis not present

## 2019-11-07 DIAGNOSIS — B3731 Acute candidiasis of vulva and vagina: Secondary | ICD-10-CM

## 2019-11-07 DIAGNOSIS — I10 Essential (primary) hypertension: Secondary | ICD-10-CM | POA: Diagnosis not present

## 2019-11-07 DIAGNOSIS — G8929 Other chronic pain: Secondary | ICD-10-CM

## 2019-11-07 DIAGNOSIS — B373 Candidiasis of vulva and vagina: Secondary | ICD-10-CM

## 2019-11-07 DIAGNOSIS — F988 Other specified behavioral and emotional disorders with onset usually occurring in childhood and adolescence: Secondary | ICD-10-CM

## 2019-11-07 MED ORDER — AMPHETAMINE-DEXTROAMPHETAMINE 20 MG PO TABS: ORAL_TABLET | ORAL | 0 refills | Status: DC

## 2019-11-07 MED ORDER — FLUCONAZOLE 150 MG PO TABS: ORAL_TABLET | ORAL | 1 refills | Status: DC

## 2019-11-07 MED ORDER — METHYLPREDNISOLONE 4 MG PO TBPK: ORAL_TABLET | ORAL | 0 refills | Status: DC

## 2019-11-07 NOTE — Progress Notes (Signed)
[facility] [place], [place] [address]  Internal MEDICINE  Office Visit Note  Patient Name: Audrey Ball  [id number]  [id number]  Date of Service: 11/09/2019  Chief Complaint  Patient presents with  . Hypertension    The patient is here for routine follow up. She is c/o pain in the left foot. This pain is much worse after she initially gets up in the mornings or stands up after siting down for a little while. Has to hold on to furniture to support weight when first getting up as pain is so severe. As she is on her feet for longer time, the pain eases up some. She has tried prescribed meloxicam and ibuprofen for this. Really did not help. Then she tried talking aleve along with tramadol which helped a lot more. She states that she is also having left knee pain. Hurts along the bottom aspect of the knee cap. She cannot pinpoint time when this is worse. States that it just hurts all the time. She has seen orthopedic provider for this in the past. Her blood pressure is well managed.  She had both Moderna COVID 19 vaccines. These are documented in her immunization record.  The patient is c/o problems with focus and concentration at work. 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 Is taking Adderall 20mg  twice daily while at work. Keeps her focused and on track. She reports no negative side effects from taking this medication. She needs to have refills today.        Current Medication: Outpatient Encounter Medications as of 11/07/2019  Medication Sig  . amphetamine-dextroamphetamine (ADDERALL) 20 MG tablet TAKE 1 TABLET BY MOUTH TWICE A DAY AS NEEDED FOR FOCUS  . betamethasone valerate ointment (VALISONE) 0.1 % Apply 1 application topically 2 (two) times daily.  . cyclobenzaprine (FLEXERIL) 10 MG tablet Take 1/2 to 1 tablet po QHS prn back pain  . hydrochlorothiazide  (HYDRODIURIL) 25 MG tablet TAKE 1 TABLET BY MOUTH EVERY DAY  . meloxicam (MOBIC) 15 MG tablet TAKE 1 TABLET BY MOUTH ONCE DAY WITH MEAL  . traMADol (ULTRAM) 50 MG tablet Take 1 tablet po Q4 to 6 hours as needed for pain  . valACYclovir (VALTREX) 1000 MG tablet Take 1 tablet (1,000 mg total) by mouth 2 (two) times daily.  . [DISCONTINUED] amphetamine-dextroamphetamine (ADDERALL) 20 MG tablet TAKE 1 TABLET BY MOUTH TWICE A DAY AS NEEDED FOR FOCUS  . [DISCONTINUED] amphetamine-dextroamphetamine (ADDERALL) 20 MG tablet TAKE 1 TABLET BY MOUTH TWICE A DAY AS NEEDED FOR FOCUS  . [DISCONTINUED] amphetamine-dextroamphetamine (ADDERALL) 20 MG tablet TAKE 1 TABLET BY MOUTH TWICE A DAY AS NEEDED FOR FOCUS  . fluconazole (DIFLUCAN) 150 MG tablet Take 1 tablet po once. May repeat dose in 3 days as needed for persistent symptoms.  . methylPREDNISolone (MEDROL) 4 MG TBPK tablet Take by mouth as directed for 6 days   No facility-administered encounter medications on file as of 11/07/2019.    Surgical History: Past Surgical History:  Procedure Laterality Date  . BACK SURGERY  2017  . GALLBLADDER SURGERY  1999  . TOTAL VAGINAL HYSTERECTOMY  2016   bilateral salpingectomy at [facility] for abnormal uterine bleeding    Medical History: Past Medical History:  Diagnosis Date  . Hypertension     Family History: Family History  Problem Relation Age of Onset  . Leukemia Mother   .  Hypertension Mother   . Diabetes Mother   . Lung cancer Father     Social History   Socioeconomic History  . Marital status: Single    Spouse name: Not on file  . Number of children: Not on file  . Years of education: Not on file  . Highest education level: Not on file  Occupational History  . Not on file  Tobacco Use  . Smoking status: Never Smoker  . Smokeless tobacco: Never Used  Substance and Sexual Activity  . Alcohol use: No  . Drug use: No  . Sexual activity: Yes    Birth control/protection: Surgical  Other  Topics Concern  . Not on file  Social History Narrative  . Not on file   Social Determinants of Health   Financial Resource Strain:   . Difficulty of Paying Living Expenses:   Food Insecurity:   . Worried About Charity fundraiser in the Last Year:   . Arboriculturist in the Last Year:   Transportation Needs:   . Film/video editor (Medical):   [place] Lack of Transportation (Non-Medical):   Physical Activity:   . Days of Exercise per Week:   . Minutes of Exercise per Session:   Stress:   . Feeling of Stress :   Social Connections:   . Frequency of Communication with Friends and Family:   . Frequency of Social Gatherings with Friends and Family:   . Attends Religious Services:   . Active Member of Clubs or Organizations:   . Attends Archivist Meetings:   [place] Marital Status:   Intimate Partner Violence:   . Fear of Current or Ex-Partner:   . Emotionally Abused:   [place] Physically Abused:   . Sexually Abused:       Review of Systems  Constitutional: Negative for chills, fatigue and unexpected weight change.  HENT: Negative for congestion, facial swelling, postnasal drip, rhinorrhea, sneezing and sore throat.   Eyes: Negative for visual disturbance.  Respiratory: Negative for cough, chest tightness, shortness of breath and wheezing.   Cardiovascular: Negative for chest pain and palpitations.  Gastrointestinal: Negative for abdominal pain, constipation, diarrhea, nausea and vomiting.  Endocrine: Negative for cold intolerance, heat intolerance, polydipsia and polyuria.  Musculoskeletal: Positive for arthralgias, back pain and myalgias. Negative for joint swelling and neck pain.       Left knee pain and swelling. Also having left foot pain.   Skin: Negative for rash.  Allergic/Immunologic: Negative for environmental allergies.  Neurological: Negative for dizziness, tremors, numbness and headaches.  Hematological: Negative for adenopathy. Does not bruise/bleed easily.    Psychiatric/Behavioral: Positive for decreased concentration. Negative for behavioral problems (Depression), sleep disturbance and suicidal ideas. The patient is not nervous/anxious.     Today's Vitals   11/07/19 0927  BP: 140/84  Pulse: 72  Resp: 16  Temp: (!) 96.9 F (36.1 C)  SpO2: 99%  Weight: 225 lb (102.1 kg)  Height: 5\' 7"  (1.702 m)   Body mass index is 35.24 kg/m.  Physical Exam Vitals and nursing note reviewed.  Constitutional:      General: She is not in acute distress.    Appearance: Normal appearance. She is well-developed. She is not diaphoretic.  HENT:     Head: Normocephalic and atraumatic.     Mouth/Throat:     Mouth: Mucous membranes are moist. No angioedema.     Pharynx: Oropharynx is clear. No oropharyngeal exudate.  Eyes:     Extraocular  Movements: Extraocular movements intact.     Conjunctiva/sclera: Conjunctivae normal.     Pupils: Pupils are equal, round, and reactive to light.  Neck:     Thyroid: No thyromegaly.     Vascular: No JVD.     Trachea: No tracheal deviation.  Cardiovascular:     Rate and Rhythm: Normal rate and regular rhythm.     Heart sounds: Normal heart sounds. No murmur. No friction rub. No gallop.   Pulmonary:     Effort: Pulmonary effort is normal. No respiratory distress.     Breath sounds: Normal breath sounds. No wheezing or rales.  Chest:     Chest wall: No tenderness.  Abdominal:     Palpations: Abdomen is soft.  Musculoskeletal:        General: Normal range of motion.     Cervical back: Normal range of motion and neck supple.     Left knee: Normal.       Legs:  Lymphadenopathy:     Cervical: No cervical adenopathy.  Skin:    General: Skin is warm and dry.  Neurological:     Mental Status: She is alert and oriented to person, place, and time.     Cranial Nerves: No cranial nerve deficit.  Psychiatric:        Behavior: Behavior normal.        Thought Content: Thought content normal.        Judgment: Judgment  normal.   Assessment/Plan: 1. Essential hypertension, benign Stable. Continue bp medication as prescribed.   2. Plantar fasciitis of left foot Add medrol dose pack. Take as directed for 6 days. Rest and ice to foot when possible. Consider referral to podiatry as indicated.  - methylPREDNISolone (MEDROL) 4 MG TBPK tablet; Take by mouth as directed for 6 days  Dispense: 21 tablet; Refill: 0  3. Chronic pain of left knee dd medrol dose pack. Take as directed for 6 days. Rest and ice the knee when possible. Recommend she see orthopedic provider for further evaluation.   4. Candidal vaginitis Diflucan 150mg  may be taken once. May repeat dose in three days for persistent symptoms.  - fluconazole (DIFLUCAN) 150 MG tablet; Take 1 tablet po once. May repeat dose in 3 days as needed for persistent symptoms.  Dispense: 3 tablet; Refill: 1  5. Attention deficit disorder of adult May continue adderall 20mg  twice daily as needed for focus and concentration. Three 30 day prescriptions sent to pharmacy. Dates are 11/07/2019, 12/06/2019, and 01/03/2020 - amphetamine-dextroamphetamine (ADDERALL) 20 MG tablet; TAKE 1 TABLET BY MOUTH TWICE A DAY AS NEEDED FOR FOCUS  Dispense: 60 tablet; Refill: 0  General Counseling: Audrey Ball verbalizes understanding of the findings of todays visit and agrees with plan of treatment. I have discussed any further diagnostic evaluation that may be needed or ordered today. We also reviewed her medications today. she has been encouraged to call the office with any questions or concerns that should arise related to todays visit.   Refilled Controlled medications today. Reviewed risks and possible side effects associated with taking Stimulants. Combination of these drugs with other psychotropic medications could cause dizziness and drowsiness. Pt needs to Monitor symptoms and exercise caution in driving and operating heavy machinery to avoid damages to oneself, to others and to the  surroundings. Patient verbalized understanding in this matter. Dependence and abuse for these drugs will be monitored closely. A Controlled substance policy and procedure is on file which allows [facility] to order a  urine drug screen test at any visit. Patient understands and agrees with the plan..  This patient was seen by Leretha Pol FNP Collaboration with Dr Lavera Guise as a part of collaborative care agreement  Meds ordered this encounter  Medications  . methylPREDNISolone (MEDROL) 4 MG TBPK tablet    Sig: Take by mouth as directed for 6 days    Dispense:  21 tablet    Refill:  0    Order Specific Question:   Supervising Provider    Answer:   Lavera Guise [place]  . DISCONTD: amphetamine-dextroamphetamine (ADDERALL) 20 MG tablet    Sig: TAKE 1 TABLET BY MOUTH TWICE A DAY AS NEEDED FOR FOCUS    Dispense:  60 tablet    Refill:  0    Order Specific Question:   Supervising Provider    Answer:   Lavera Guise [id number]  . fluconazole (DIFLUCAN) 150 MG tablet    Sig: Take 1 tablet po once. May repeat dose in 3 days as needed for persistent symptoms.    Dispense:  3 tablet    Refill:  1    Order Specific Question:   Supervising Provider    Answer:   Lavera Guise [id number]  . DISCONTD: amphetamine-dextroamphetamine (ADDERALL) 20 MG tablet    Sig: TAKE 1 TABLET BY MOUTH TWICE A DAY AS NEEDED FOR FOCUS    Dispense:  60 tablet    Refill:  0    Fill after 12/06/2019    Order Specific Question:   Supervising Provider    Answer:   Lavera Guise [place]  . amphetamine-dextroamphetamine (ADDERALL) 20 MG tablet    Sig: TAKE 1 TABLET BY MOUTH TWICE A DAY AS NEEDED FOR FOCUS    Dispense:  60 tablet    Refill:  0    Fill after 01/03/2020    Order Specific Question:   Supervising Provider    Answer:   Lavera Guise [id number]    Total time spent: 30 Minutes   Time spent includes review of chart, medications, test results, and follow up plan with the patient.      Dr Lavera Guise Internal medicine

## 2019-11-09 DIAGNOSIS — M722 Plantar fascial fibromatosis: Secondary | ICD-10-CM | POA: Insufficient documentation

## 2019-12-02 DIAGNOSIS — M1712 Unilateral primary osteoarthritis, left knee: Secondary | ICD-10-CM | POA: Diagnosis not present

## 2019-12-19 ENCOUNTER — Ambulatory Visit: Payer: BC Managed Care – PPO | Admitting: Adult Health

## 2019-12-19 ENCOUNTER — Other Ambulatory Visit: Payer: Self-pay

## 2019-12-19 ENCOUNTER — Encounter: Payer: Self-pay | Admitting: Adult Health

## 2019-12-19 VITALS — Ht 67.0 in | Wt 225.0 lb

## 2019-12-19 DIAGNOSIS — I10 Essential (primary) hypertension: Secondary | ICD-10-CM | POA: Diagnosis not present

## 2019-12-19 DIAGNOSIS — J0110 Acute frontal sinusitis, unspecified: Secondary | ICD-10-CM | POA: Diagnosis not present

## 2019-12-19 MED ORDER — SULFAMETHOXAZOLE-TRIMETHOPRIM 400-80 MG PO TABS: 1.0000 | ORAL_TABLET | Freq: Two times a day (BID) | ORAL | 0 refills | Status: DC

## 2019-12-19 NOTE — Progress Notes (Signed)
[facility] [place], [place] [address]  Internal MEDICINE  Telephone Visit  Patient Name: Audrey Ball  [id number]  [id number]  Date of Service: 12/19/2019  I connected with the patient at 936 by telephone and verified the patients identity using two identifiers.   I discussed the limitations, risks, security and privacy concerns of performing an evaluation and management service by telephone and the availability of in person appointments. I also discussed with the patient that there may be a patient responsible charge related to the service.  The patient expressed understanding and agrees to proceed.    Chief Complaint  Patient presents with  . Telephone Screen  . Telephone Assessment  . Sinusitis  . Sore Throat  . Headache  . Eye Problem    HPI  Pt seen via telephone.  She reports  sore throat and sinus congestion for two days.  She reports using Flonase and Nyquil with no relief.  She denies any fever or chills.    Current Medication: Outpatient Encounter Medications as of 12/19/2019  Medication Sig  . amphetamine-dextroamphetamine (ADDERALL) 20 MG tablet TAKE 1 TABLET BY MOUTH TWICE A DAY AS NEEDED FOR FOCUS  . betamethasone valerate ointment (VALISONE) 0.1 % Apply 1 application topically 2 (two) times daily.  . cyclobenzaprine (FLEXERIL) 10 MG tablet Take 1/2 to 1 tablet po QHS prn back pain  . fluconazole (DIFLUCAN) 150 MG tablet Take 1 tablet po once. May repeat dose in 3 days as needed for persistent symptoms.  . hydrochlorothiazide (HYDRODIURIL) 25 MG tablet TAKE 1 TABLET BY MOUTH EVERY DAY  . meloxicam (MOBIC) 15 MG tablet TAKE 1 TABLET BY MOUTH ONCE DAY WITH MEAL  . methylPREDNISolone (MEDROL) 4 MG TBPK tablet Take by mouth as directed for 6 days  . traMADol (ULTRAM) 50 MG tablet Take 1 tablet po Q4 to 6 hours as needed for pain  . valACYclovir (VALTREX) 1000 MG tablet Take 1 tablet (1,000 mg total) by mouth 2 (two) times daily.   No  facility-administered encounter medications on file as of 12/19/2019.    Surgical History: Past Surgical History:  Procedure Laterality Date  . BACK SURGERY  2017  . GALLBLADDER SURGERY  1999  . TOTAL VAGINAL HYSTERECTOMY  2016   bilateral salpingectomy at [facility] for abnormal uterine bleeding    Medical History: Past Medical History:  Diagnosis Date  . Hypertension     Family History: Family History  Problem Relation Age of Onset  . Leukemia Mother   . Hypertension Mother   . Diabetes Mother   . Lung cancer Father     Social History   Socioeconomic History  . Marital status: Single    Spouse name: Not on file  . Number of children: Not on file  . Years of education: Not on file  . Highest education level: Not on file  Occupational History  . Not on file  Tobacco Use  . Smoking status: Never Smoker  . Smokeless tobacco: Never Used  Substance and Sexual Activity  . Alcohol use: No  . Drug use: No  . Sexual activity: Yes    Birth control/protection: Surgical  Other Topics Concern  . Not on file  Social History Narrative  . Not on file   Social Determinants of Health   Financial Resource Strain:   . Difficulty of Paying Living Expenses:   Food Insecurity:   . Worried About Charity fundraiser in the Last Year:   . Ran  Out of Food in the Last Year:   Transportation Needs:   . Lack of Transportation (Medical):   Marland Kitchen Lack of Transportation (Non-Medical):   Physical Activity:   . Days of Exercise per Week:   . Minutes of Exercise per Session:   Stress:   . Feeling of Stress :   Social Connections:   . Frequency of Communication with Friends and Family:   . Frequency of Social Gatherings with Friends and Family:   . Attends Religious Services:   . Active Member of Clubs or Organizations:   . Attends Archivist Meetings:   Marland Kitchen Marital Status:   Intimate Partner Violence:   . Fear of Current or Ex-Partner:   . Emotionally Abused:   Marland Kitchen Physically  Abused:   . Sexually Abused:       Review of Systems  Constitutional: Negative for chills, fatigue and unexpected weight change.  HENT: Negative for congestion, rhinorrhea, sneezing and sore throat.   Eyes: Negative for photophobia, pain and redness.  Respiratory: Negative for cough, chest tightness and shortness of breath.   Cardiovascular: Negative for chest pain and palpitations.  Gastrointestinal: Negative for abdominal pain, constipation, diarrhea, nausea and vomiting.  Endocrine: Negative.   Genitourinary: Negative for dysuria and frequency.  Musculoskeletal: Negative for arthralgias, back pain, joint swelling and neck pain.  Skin: Negative for rash.  Allergic/Immunologic: Negative.   Neurological: Negative for tremors and numbness.  Hematological: Negative for adenopathy. Does not bruise/bleed easily.  Psychiatric/Behavioral: Negative for behavioral problems and sleep disturbance. The patient is not nervous/anxious.     Vital Signs: Ht 5\' 7"  (1.702 m)   Wt 225 lb (102.1 kg)   BMI 35.24 kg/m    Observation/Objective:  Well sounding, NAD.    Assessment/Plan: 1. Subacute frontal sinusitis Advised patient to take entire course of antibiotics as prescribed with food. Pt should return to clinic in 7-10 days if symptoms fail to improve or new symptoms develop.  - sulfamethoxazole-trimethoprim (BACTRIM) 400-80 MG tablet; Take 1 tablet by mouth 2 (two) times daily.  Dispense: 20 tablet; Refill: 0  2. Essential hypertension, benign No recent issues.  Continue to monitor.   General Counseling: Audrey Ball verbalizes understanding of the findings of today's phone visit and agrees with plan of treatment. I have discussed any further diagnostic evaluation that may be needed or ordered today. We also reviewed her medications today. she has been encouraged to call the office with any questions or concerns that should arise related to todays visit.    No orders of the defined types  were placed in this encounter.   No orders of the defined types were placed in this encounter.   Time spent: [facility] Internal medicine

## 2020-02-01 ENCOUNTER — Other Ambulatory Visit: Payer: Self-pay

## 2020-02-01 DIAGNOSIS — I10 Essential (primary) hypertension: Secondary | ICD-10-CM

## 2020-02-01 MED ORDER — HYDROCHLOROTHIAZIDE 25 MG PO TABS: 25.0000 mg | ORAL_TABLET | Freq: Every day | ORAL | 1 refills | Status: DC

## 2020-02-02 ENCOUNTER — Telehealth: Payer: Self-pay

## 2020-02-02 NOTE — Telephone Encounter (Signed)
Confirmed and screened for 02-06-20 ov. 

## 2020-02-06 ENCOUNTER — Ambulatory Visit: Payer: BC Managed Care – PPO | Admitting: Nurse Practitioner

## 2020-02-06 ENCOUNTER — Other Ambulatory Visit: Payer: Self-pay

## 2020-02-06 ENCOUNTER — Encounter: Payer: Self-pay | Admitting: Nurse Practitioner

## 2020-02-06 VITALS — BP 142/86 | HR 73 | Temp 97.5°F | Resp 16 | Ht 67.0 in | Wt 229.0 lb

## 2020-02-06 DIAGNOSIS — I10 Essential (primary) hypertension: Secondary | ICD-10-CM

## 2020-02-06 DIAGNOSIS — F988 Other specified behavioral and emotional disorders with onset usually occurring in childhood and adolescence: Secondary | ICD-10-CM

## 2020-02-06 DIAGNOSIS — M25562 Pain in left knee: Secondary | ICD-10-CM | POA: Diagnosis not present

## 2020-02-06 DIAGNOSIS — M545 Low back pain, unspecified: Secondary | ICD-10-CM

## 2020-02-06 DIAGNOSIS — G8929 Other chronic pain: Secondary | ICD-10-CM

## 2020-02-06 MED ORDER — AMPHETAMINE-DEXTROAMPHETAMINE 20 MG PO TABS: ORAL_TABLET | ORAL | 0 refills | Status: DC

## 2020-02-06 MED ORDER — MELOXICAM 15 MG PO TABS: 15.0000 mg | ORAL_TABLET | Freq: Every day | ORAL | 1 refills | Status: DC

## 2020-02-06 MED ORDER — TRAMADOL HCL 50 MG PO TABS: ORAL_TABLET | ORAL | 2 refills | Status: DC

## 2020-02-06 NOTE — Progress Notes (Signed)
[facility] [place], [place] [address]  Internal MEDICINE  Office Visit Note  Patient Name: Audrey Ball  [id number]  [id number]  Date of Service: 02/19/2020  Chief Complaint  Patient presents with  . Follow-up  . Hypertension  . Quality Metric Gaps    hep C, colonoscopy, tetnaus    The patient is here for routine follow up. The patient is c/o problems with focus and concentration at work. She is currently driving for transit in [facility] and feels like she can't pay attention to all that's going on around her at all times. Is taking Adderall 20mg  twice daily while at work. Keeps her focused and on track. She reports no negative side effects from taking this medication. She needs to have refills today.  Her blood pressure is well managed. She does have chronic left knee pain and low back pain with sciatica. She takes meloxicam 15mg  daiy to keep inflammation and pain low. She does take tramadol as needed for severe pain. She needs to have refills for these prescriptions as well.        Current Medication: Outpatient Encounter Medications as of 02/06/2020  Medication Sig  . betamethasone valerate ointment (VALISONE) 0.1 % Apply 1 application topically 2 (two) times daily.  . cyclobenzaprine (FLEXERIL) 10 MG tablet Take 1/2 to 1 tablet po QHS prn back pain  . fluconazole (DIFLUCAN) 150 MG tablet Take 1 tablet po once. May repeat dose in 3 days as needed for persistent symptoms.  . hydrochlorothiazide (HYDRODIURIL) 25 MG tablet Take 1 tablet (25 mg total) by mouth daily.  . meloxicam (MOBIC) 15 MG tablet Take 1 tablet (15 mg total) by mouth daily.  . traMADol (ULTRAM) 50 MG tablet Take 1 tablet po Q4 to 6 hours as needed for pain  . valACYclovir (VALTREX) 1000 MG tablet Take 1 tablet (1,000 mg total) by mouth 2 (two) times daily.  . [DISCONTINUED] amphetamine-dextroamphetamine (ADDERALL) 20 MG tablet TAKE 1 TABLET BY MOUTH TWICE A DAY AS NEEDED FOR  FOCUS  . [DISCONTINUED] amphetamine-dextroamphetamine (ADDERALL) 20 MG tablet TAKE 1 TABLET BY MOUTH TWICE A DAY AS NEEDED FOR FOCUS  . [DISCONTINUED] amphetamine-dextroamphetamine (ADDERALL) 20 MG tablet TAKE 1 TABLET BY MOUTH TWICE A DAY AS NEEDED FOR FOCUS  . [DISCONTINUED] meloxicam (MOBIC) 15 MG tablet TAKE 1 TABLET BY MOUTH ONCE DAY WITH MEAL  . [DISCONTINUED] methylPREDNISolone (MEDROL) 4 MG TBPK tablet Take by mouth as directed for 6 days  . [DISCONTINUED] sulfamethoxazole-trimethoprim (BACTRIM) 400-80 MG tablet Take 1 tablet by mouth 2 (two) times daily.  . [DISCONTINUED] traMADol (ULTRAM) 50 MG tablet Take 1 tablet po Q4 to 6 hours as needed for pain  . amphetamine-dextroamphetamine (ADDERALL) 20 MG tablet TAKE 1 TABLET BY MOUTH TWICE A DAY AS NEEDED FOR FOCUS   No facility-administered encounter medications on file as of 02/06/2020.    Surgical History: Past Surgical History:  Procedure Laterality Date  . BACK SURGERY  2017  . GALLBLADDER SURGERY  1999  . TOTAL VAGINAL HYSTERECTOMY  2016   bilateral salpingectomy at [facility] for abnormal uterine bleeding    Medical History: Past Medical History:  Diagnosis Date  . Hypertension     Family History: Family History  Problem Relation Age of Onset  . Leukemia Mother   . Hypertension Mother   . Diabetes Mother   . Lung cancer Father     Social History   Socioeconomic History  . Marital status: Single    Spouse  name: Not on file  . Number of children: Not on file  . Years of education: Not on file  . Highest education level: Not on file  Occupational History  . Not on file  Tobacco Use  . Smoking status: Never Smoker  . Smokeless tobacco: Never Used  Substance and Sexual Activity  . Alcohol use: No  . Drug use: No  . Sexual activity: Yes    Birth control/protection: Surgical  Other Topics Concern  . Not on file  Social History Narrative  . Not on file   Social Determinants of Health   Financial Resource  Strain:   . Difficulty of Paying Living Expenses: Not on file  Food Insecurity:   . Worried About Charity fundraiser in the Last Year: Not on file  . Ran Out of Food in the Last Year: Not on file  Transportation Needs:   . Lack of Transportation (Medical): Not on file  . Lack of Transportation (Non-Medical): Not on file  Physical Activity:   . Days of Exercise per Week: Not on file  . Minutes of Exercise per Session: Not on file  Stress:   . Feeling of Stress : Not on file  Social Connections:   . Frequency of Communication with Friends and Family: Not on file  . Frequency of Social Gatherings with Friends and Family: Not on file  . Attends Religious Services: Not on file  . Active Member of Clubs or Organizations: Not on file  . Attends Archivist Meetings: Not on file  . Marital Status: Not on file  Intimate Partner Violence:   . Fear of Current or Ex-Partner: Not on file  . Emotionally Abused: Not on file  . Physically Abused: Not on file  . Sexually Abused: Not on file      Review of Systems  Constitutional: Negative for chills, fatigue and unexpected weight change.  HENT: Negative for congestion, facial swelling, postnasal drip, rhinorrhea, sneezing and sore throat.   Respiratory: Negative for cough, chest tightness, shortness of breath and wheezing.   Cardiovascular: Negative for chest pain and palpitations.  Gastrointestinal: Negative for abdominal pain, constipation, diarrhea, nausea and vomiting.  Endocrine: Negative for cold intolerance, heat intolerance, polydipsia and polyuria.  Musculoskeletal: Positive for arthralgias, back pain and myalgias. Negative for joint swelling and neck pain.       Left knee pain and swelling. Also having left foot pain.   Skin: Negative for rash.  Allergic/Immunologic: Negative for environmental allergies.  Neurological: Negative for dizziness, tremors, numbness and headaches.  Hematological: Negative for adenopathy. Does  not bruise/bleed easily.  Psychiatric/Behavioral: Positive for decreased concentration. Negative for behavioral problems (Depression), sleep disturbance and suicidal ideas. The patient is not nervous/anxious.     Today's Vitals   02/06/20 0935  BP: (!) 142/86  Pulse: 73  Resp: 16  Temp: (!) 97.5 F (36.4 C)  SpO2: 99%  Weight: 229 lb (103.9 kg)  Height: 5\' 7"  (1.702 m)   Body mass index is 35.87 kg/m.  Physical Exam Vitals and nursing note reviewed.  Constitutional:      General: She is not in acute distress.    Appearance: Normal appearance. She is well-developed. She is not diaphoretic.  HENT:     Head: Normocephalic and atraumatic.     Mouth/Throat:     Mouth: Mucous membranes are moist. No angioedema.     Pharynx: Oropharynx is clear. No oropharyngeal exudate.  Eyes:     Extraocular Movements: Extraocular  movements intact.     Conjunctiva/sclera: Conjunctivae normal.     Pupils: Pupils are equal, round, and reactive to light.  Neck:     Thyroid: No thyromegaly.     Vascular: No JVD.     Trachea: No tracheal deviation.  Cardiovascular:     Rate and Rhythm: Normal rate and regular rhythm.     Heart sounds: Normal heart sounds. No murmur heard.  No friction rub. No gallop.   Pulmonary:     Effort: Pulmonary effort is normal. No respiratory distress.     Breath sounds: Normal breath sounds. No wheezing or rales.  Chest:     Chest wall: No tenderness.  Abdominal:     General: Bowel sounds are normal.     Palpations: Abdomen is soft.  Musculoskeletal:        General: Normal range of motion.     Cervical back: Normal range of motion and neck supple.  Lymphadenopathy:     Cervical: No cervical adenopathy.  Skin:    General: Skin is warm and dry.  Neurological:     Mental Status: She is alert and oriented to person, place, and time.     Cranial Nerves: No cranial nerve deficit.  Psychiatric:        Behavior: Behavior normal.        Thought Content: Thought  content normal.        Judgment: Judgment normal.   Assessment/Plan:  1. Essential hypertension, benign Blood pressure generally stable. Continue bp medication as prescribed   2. Chronic pain of left knee May take meloxicam 15mg  daily as needed to reduce pain/inflammation.  - meloxicam (MOBIC) 15 MG tablet; Take 1 tablet (15 mg total) by mouth daily.  Dispense: 90 tablet; Refill: 1  3. Chronic bilateral low back pain without sciatica Tramadol may be taken daily as needed for pain which breaks through meloxicam treatment. New prescription sent oth er pharmacy today.  - traMADol (ULTRAM) 50 MG tablet; Take 1 tablet po Q4 to 6 hours as needed for pain  Dispense: 30 tablet; Refill: 2  4. Attention deficit disorder of adult May continue adderall 20mg  up to twice daily as needed for focus/concentration. Three 30 day prescriptions were sent to her pharmacy. Dates are 02/06/2020, 03/06/2020, and 04/03/2020.  - amphetamine-dextroamphetamine (ADDERALL) 20 MG tablet; TAKE 1 TABLET BY MOUTH TWICE A DAY AS NEEDED FOR FOCUS  Dispense: 60 tablet; Refill: 0   General Counseling: Danyla verbalizes understanding of the findings of todays visit and agrees with plan of treatment. I have discussed any further diagnostic evaluation that may be needed or ordered today. We also reviewed her medications today. she has been encouraged to call the office with any questions or concerns that should arise related to todays visit.    Refilled Controlled medications today. Reviewed risks and possible side effects associated with taking Stimulants. Combination of these drugs with other psychotropic medications could cause dizziness and drowsiness. Pt needs to Monitor symptoms and exercise caution in driving and operating heavy machinery to avoid damages to oneself, to others and to the surroundings. Patient verbalized understanding in this matter. Dependence and abuse for these drugs will be monitored closely. A Controlled  substance policy and procedure is on file which allows [facility] to order a urine drug screen test at any visit. Patient understands and agrees with the plan..  This patient was seen by Leretha Pol FNP Collaboration with Dr Lavera Guise as a part of collaborative care  agreement  Meds ordered this encounter  Medications  . DISCONTD: amphetamine-dextroamphetamine (ADDERALL) 20 MG tablet    Sig: TAKE 1 TABLET BY MOUTH TWICE A DAY AS NEEDED FOR FOCUS    Dispense:  60 tablet    Refill:  0    Order Specific Question:   Supervising Provider    Answer:   Lavera Guise [[id number]]  . traMADol (ULTRAM) 50 MG tablet    Sig: Take 1 tablet po Q4 to 6 hours as needed for pain    Dispense:  30 tablet    Refill:  2    Order Specific Question:   Supervising Provider    Answer:   Lavera Guise F[place]  . meloxicam (MOBIC) 15 MG tablet    Sig: Take 1 tablet (15 mg total) by mouth daily.    Dispense:  90 tablet    Refill:  1    Order Specific Question:   Supervising Provider    Answer:   Lavera Guise [[id number]]  . DISCONTD: amphetamine-dextroamphetamine (ADDERALL) 20 MG tablet    Sig: TAKE 1 TABLET BY MOUTH TWICE A DAY AS NEEDED FOR FOCUS    Dispense:  60 tablet    Refill:  0    Fill after 03/06/2020    Order Specific Question:   Supervising Provider    Answer:   Lavera Guise [place]  . amphetamine-dextroamphetamine (ADDERALL) 20 MG tablet    Sig: TAKE 1 TABLET BY MOUTH TWICE A DAY AS NEEDED FOR FOCUS    Dispense:  60 tablet    Refill:  0    Fill after 04/03/2020    Order Specific Question:   Supervising Provider    Answer:   Lavera Guise [[id number]]    Total time spent: 30 Minutes   Time spent includes review of chart, medications, test results, and follow up plan with the patient.      Dr Lavera Guise Internal medicine

## 2020-05-23 ENCOUNTER — Other Ambulatory Visit: Payer: Self-pay

## 2020-05-23 ENCOUNTER — Encounter: Payer: Self-pay | Admitting: Nurse Practitioner

## 2020-05-23 ENCOUNTER — Ambulatory Visit: Payer: Self-pay | Admitting: Nurse Practitioner

## 2020-05-23 VITALS — BP 144/90 | HR 80 | Temp 97.3°F | Resp 16 | Ht 67.0 in | Wt 222.2 lb

## 2020-05-23 DIAGNOSIS — R319 Hematuria, unspecified: Secondary | ICD-10-CM

## 2020-05-23 DIAGNOSIS — F988 Other specified behavioral and emotional disorders with onset usually occurring in childhood and adolescence: Secondary | ICD-10-CM

## 2020-05-23 DIAGNOSIS — I10 Essential (primary) hypertension: Secondary | ICD-10-CM

## 2020-05-23 DIAGNOSIS — R300 Dysuria: Secondary | ICD-10-CM

## 2020-05-23 DIAGNOSIS — N390 Urinary tract infection, site not specified: Secondary | ICD-10-CM

## 2020-05-23 LAB — POCT URINALYSIS DIPSTICK
Bilirubin, UA: NEGATIVE
Glucose, UA: NEGATIVE
Leukocytes, UA: NEGATIVE
Nitrite, UA: NEGATIVE
Protein, UA: NEGATIVE
Spec Grav, UA: 1.01 (ref 1.010–1.025)
Urobilinogen, UA: 0.2 E.U./dL
pH, UA: 7.5 (ref 5.0–8.0)

## 2020-05-23 MED ORDER — SULFAMETHOXAZOLE-TRIMETHOPRIM 800-160 MG PO TABS: 1.0000 | ORAL_TABLET | Freq: Two times a day (BID) | ORAL | 0 refills | Status: DC

## 2020-05-23 MED ORDER — AMPHETAMINE-DEXTROAMPHETAMINE 20 MG PO TABS: ORAL_TABLET | ORAL | 0 refills | Status: DC

## 2020-05-23 NOTE — Progress Notes (Signed)
[facility] [place], [place] [address]  Internal MEDICINE  Office Visit Note  Patient Name: Audrey Ball  [id number]  [id number]  Date of Service: 05/23/2020  Chief Complaint  Patient presents with  . Follow-up    ph level was high, left foot hurts x4days  . Hypertension  . policy update form    received    The patient is here for follow up visit. She has had some right sided lower back pain this last week. 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 Today, urine dipstick shows normal pH of 7.5 with moderate hematuria. Will treat for uti and retest urine dip after completion of antibiotics.   The patient is c/o problems with focus and concentration at work. She is currently driving for transit in [place] and feels like she can't pay attention to all that's going on around her at all times. Is taking Adderall 20mg  twice daily while at work. Keeps her focused and on track. She reports no negative side effects from taking this medication. Her employer has submitted paperwork to be completed by her provider attesting to her taking this medication and that it is, in fact, prescribed for her and the diagnosis which is associated with taking this medication.       Current Medication: Outpatient Encounter Medications as of 05/23/2020  Medication Sig  . amphetamine-dextroamphetamine (ADDERALL) 20 MG tablet TAKE 1 TABLET BY MOUTH TWICE A DAY AS NEEDED FOR FOCUS  . cyclobenzaprine (FLEXERIL) 10 MG tablet Take 1/2 to 1 tablet po QHS prn back pain  . fluconazole (DIFLUCAN) 150 MG tablet Take 1 tablet po once. May repeat dose in 3 days as needed for persistent symptoms.  . hydrochlorothiazide (HYDRODIURIL) 25 MG tablet Take 1 tablet (25 mg total) by mouth daily.  . meloxicam (MOBIC) 15 MG tablet Take 1 tablet (15 mg total) by mouth daily.  . traMADol (ULTRAM) 50 MG tablet Take 1 tablet po Q4 to 6 hours as needed  for pain  . valACYclovir (VALTREX) 1000 MG tablet Take 1 tablet (1,000 mg total) by mouth 2 (two) times daily.  Marland Kitchen sulfamethoxazole-trimethoprim (BACTRIM DS) 800-160 MG tablet Take 1 tablet by mouth 2 (two) times daily.  . [DISCONTINUED] betamethasone valerate ointment (VALISONE) 0.1 % Apply 1 application topically 2 (two) times daily. (Patient not taking: Reported on 05/23/2020)   No facility-administered encounter medications on file as of 05/23/2020.    Surgical History: Past Surgical History:  Procedure Laterality Date  . BACK SURGERY  2017  . GALLBLADDER SURGERY  1999  . TOTAL VAGINAL HYSTERECTOMY  2016   bilateral salpingectomy at [facility] for abnormal uterine bleeding    Medical History: Past Medical History:  Diagnosis Date  . Hypertension     Family History: Family History  Problem Relation Age of Onset  . Leukemia Mother   . Hypertension Mother   . Diabetes Mother   . Lung cancer Father     Social History   Socioeconomic History  . Marital status: Single    Spouse name: Not on file  . Number of children: Not on file  . Years of education: Not on file  . Highest education level: Not on file  Occupational History  . Not on file  Tobacco Use  . Smoking status: Never Smoker  . Smokeless tobacco: Never Used  Substance and Sexual Activity  . Alcohol use: No  . Drug  use: No  . Sexual activity: Yes    Birth control/protection: Surgical  Other Topics Concern  . Not on file  Social History Narrative  . Not on file   Social Determinants of Health   Financial Resource Strain:   . Difficulty of Paying Living Expenses: Not on file  Food Insecurity:   . Worried About Charity fundraiser in the Last Year: Not on file  . Ran Out of Food in the Last Year: Not on file  Transportation Needs:   . Lack of Transportation (Medical): Not on file  . Lack of Transportation (Non-Medical): Not on file  Physical Activity:   . Days of Exercise per Week: Not on file  . Minutes of  Exercise per Session: Not on file  Stress:   . Feeling of Stress : Not on file  Social Connections:   . Frequency of Communication with Friends and Family: Not on file  . Frequency of Social Gatherings with Friends and Family: Not on file  . Attends Religious Services: Not on file  . Active Member of Clubs or Organizations: Not on file  . Attends Archivist Meetings: Not on file  . Marital Status: Not on file  Intimate Partner Violence:   . Fear of Current or Ex-Partner: Not on file  . Emotionally Abused: Not on file  . Physically Abused: Not on file  . Sexually Abused: Not on file      Review of Systems  Constitutional: Negative for activity change, chills, fatigue and unexpected weight change.  HENT: Negative for congestion, postnasal drip, rhinorrhea, sneezing and sore throat.   Respiratory: Negative for cough, chest tightness, shortness of breath and wheezing.   Cardiovascular: Negative for chest pain and palpitations.  Gastrointestinal: Negative for abdominal pain, constipation, diarrhea, nausea and vomiting.  Endocrine: Negative for cold intolerance, heat intolerance, polydipsia and polyuria.  Genitourinary: Positive for flank pain and hematuria. Negative for dysuria and frequency.  Musculoskeletal: Positive for back pain. Negative for arthralgias, joint swelling and neck pain.  Skin: Negative for rash.  Allergic/Immunologic: Negative for environmental allergies.  Neurological: Negative for dizziness, tremors, numbness and headaches.  Hematological: Negative for adenopathy. Does not bruise/bleed easily.  Psychiatric/Behavioral: Positive for decreased concentration. Negative for behavioral problems (Depression), sleep disturbance and suicidal ideas. The patient is not nervous/anxious.     Today's Vitals   05/23/20 1152  BP: (!) 144/90  Pulse: 80  Resp: 16  Temp: (!) 97.3 F (36.3 C)  SpO2: 99%  Weight: 222 lb 3.2 oz (100.8 kg)  Height: 5\' 7"  (1.702 m)    Body mass index is 34.8 kg/m.  Physical Exam Vitals and nursing note reviewed.  Constitutional:      General: She is not in acute distress.    Appearance: Normal appearance. She is well-developed. She is not diaphoretic.  HENT:     Head: Normocephalic and atraumatic.     Mouth/Throat:     Pharynx: No oropharyngeal exudate.  Eyes:     Pupils: Pupils are equal, round, and reactive to light.  Neck:     Thyroid: No thyromegaly.     Vascular: No JVD.     Trachea: No tracheal deviation.  Cardiovascular:     Rate and Rhythm: Normal rate and regular rhythm.     Heart sounds: Normal heart sounds. No murmur heard.  No friction rub. No gallop.   Pulmonary:     Effort: Pulmonary effort is normal. No respiratory distress.     Breath  sounds: Normal breath sounds. No wheezing or rales.  Chest:     Chest wall: No tenderness.  Abdominal:     Palpations: Abdomen is soft.  Genitourinary:    Comments: Urine dip positive for moderate blood and trace ketones. The pH is 7.5. she has right sided flank pain.  Musculoskeletal:        General: Normal range of motion.     Cervical back: Normal range of motion and neck supple.  Lymphadenopathy:     Cervical: No cervical adenopathy.  Skin:    General: Skin is warm and dry.  Neurological:     General: No focal deficit present.     Mental Status: She is alert and oriented to person, place, and time.     Cranial Nerves: No cranial nerve deficit.  Psychiatric:        Mood and Affect: Mood normal.        Behavior: Behavior normal.        Thought Content: Thought content normal.        Judgment: Judgment normal.    Assessment/Plan: 1. Essential hypertension, benign Stable. Continue bp medication as prescribed   2. Urinary tract infection with hematuria, site unspecified Treat with bactrim DS twice daily for 7 days. Send urine for culture and sensitivity and adjust antibiotics as indicated. Retest u/a in 2 weeks.  -  sulfamethoxazole-trimethoprim (BACTRIM DS) 800-160 MG tablet; Take 1 tablet by mouth 2 (two) times daily.  Dispense: 14 tablet; Refill: 0  3. Dysuria Treat with bactrim DS twice daily for 7 days. Send urine for culture and sensitivity and adjust antibiotics as indicated. Retest u/a in 2 weeks. - POCT Urinalysis Dipstick - CULTURE, URINE COMPREHENSIVE  4. Attention deficit disorder of adult Single, written rx for adderall 20mg . May be taken twice daily as needed for focus and concentration. Will complete provider attestation form and fax to her employer   General Counseling: Lousie verbalizes understanding of the findings of todays visit and agrees with plan of treatment. I have discussed any further diagnostic evaluation that may be needed or ordered today. We also reviewed her medications today. she has been encouraged to call the office with any questions or concerns that should arise related to todays visit.  Refilled Controlled medications today. Reviewed risks and possible side effects associated with taking Stimulants. Combination of these drugs with other psychotropic medications could cause dizziness and drowsiness. Pt needs to Monitor symptoms and exercise caution in driving and operating heavy machinery to avoid damages to oneself, to others and to the surroundings. Patient verbalized understanding in this matter. Dependence and abuse for these drugs will be monitored closely. A Controlled substance policy and procedure is on file which allows [facility] to order a urine drug screen test at any visit. Patient understands and agrees with the plan..  This patient was seen by Leretha Pol FNP Collaboration with Dr Lavera Guise as a part of collaborative care agreement  Orders Placed This Encounter  Procedures  . CULTURE, URINE COMPREHENSIVE  . POCT Urinalysis Dipstick    Meds ordered this encounter  Medications  . sulfamethoxazole-trimethoprim (BACTRIM DS) 800-160 MG  tablet    Sig: Take 1 tablet by mouth 2 (two) times daily.    Dispense:  14 tablet    Refill:  0    Order Specific Question:   Supervising Provider    Answer:   Lavera Guise [[id number]]    Total time spent: 30 Minutes   Time  spent includes review of chart, medications, test results, and follow up plan with the patient.      Dr Lavera Guise Internal medicine

## 2020-05-26 LAB — CULTURE, URINE COMPREHENSIVE

## 2020-05-28 ENCOUNTER — Telehealth: Payer: Self-pay

## 2020-05-29 ENCOUNTER — Other Ambulatory Visit: Payer: Self-pay | Admitting: Nurse Practitioner

## 2020-05-29 DIAGNOSIS — R319 Hematuria, unspecified: Secondary | ICD-10-CM

## 2020-05-29 DIAGNOSIS — N390 Urinary tract infection, site not specified: Secondary | ICD-10-CM

## 2020-05-29 MED ORDER — NITROFURANTOIN MONOHYD MACRO 100 MG PO CAPS: 100.0000 mg | ORAL_CAPSULE | Freq: Two times a day (BID) | ORAL | 0 refills | Status: DC

## 2020-05-29 NOTE — Telephone Encounter (Signed)
I changed the antibiotics to macrobid. This is twice daily for next 5 days. Sent to her pharmacy.

## 2020-05-29 NOTE — Telephone Encounter (Signed)
Pt.notified

## 2020-06-20 ENCOUNTER — Other Ambulatory Visit: Payer: Self-pay

## 2020-06-20 ENCOUNTER — Ambulatory Visit: Payer: Self-pay | Admitting: Hospice and Palliative Medicine

## 2020-06-20 ENCOUNTER — Encounter: Payer: Self-pay | Admitting: Hospice and Palliative Medicine

## 2020-06-20 VITALS — BP 134/92 | HR 91 | Temp 97.2°F | Resp 16 | Ht 67.0 in | Wt 223.4 lb

## 2020-06-20 DIAGNOSIS — G4700 Insomnia, unspecified: Secondary | ICD-10-CM

## 2020-06-20 DIAGNOSIS — F988 Other specified behavioral and emotional disorders with onset usually occurring in childhood and adolescence: Secondary | ICD-10-CM

## 2020-06-20 DIAGNOSIS — R300 Dysuria: Secondary | ICD-10-CM

## 2020-06-20 DIAGNOSIS — I10 Essential (primary) hypertension: Secondary | ICD-10-CM

## 2020-06-20 LAB — POCT URINALYSIS DIPSTICK
Bilirubin, UA: NEGATIVE
Glucose, UA: NEGATIVE
Leukocytes, UA: NEGATIVE
Nitrite, UA: NEGATIVE
Protein, UA: POSITIVE — AB
Spec Grav, UA: <=1.005 — AB (ref 1.010–1.025)
Urobilinogen, UA: 0.2 E.U./dL
pH, UA: 7.5 (ref 5.0–8.0)

## 2020-06-20 NOTE — Progress Notes (Signed)
[facility] [address] [place], [place] [address]  Internal MEDICINE  Office Visit Note  Patient Name: Audrey Ball  [id number]  [id number]  Date of Service: 06/24/2020  Chief Complaint  Patient presents with  . Follow-up  . Hypertension  . 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  Has since completed antibiotic therapy repeat urine today shows positive for protein as low pH She is becoming increasing frustrated with abnormal urine results  Discussed her elevated BP today--questioned use of adderall, does take as needed mostly during the week, she explains she takes this for energy and focus, does not routinely take her HCTZ Discussed her sleep, she does not sleep well at night, does feel tired most of the day and has issues staying focused, discussed OSA and PSG testing, she is not ready for this, will try to improve her sleep    Current Medication: Outpatient Encounter Medications as of 06/20/2020  Medication Sig  . amphetamine-dextroamphetamine (ADDERALL) 20 MG tablet TAKE 1 TABLET BY MOUTH TWICE A DAY AS NEEDED FOR FOCUS  . cyclobenzaprine (FLEXERIL) 10 MG tablet Take 1/2 to 1 tablet po QHS prn back pain  . fluconazole (DIFLUCAN) 150 MG tablet Take 1 tablet po once. May repeat dose in 3 days as needed for persistent symptoms.  . hydrochlorothiazide (HYDRODIURIL) 25 MG tablet Take 1 tablet (25 mg total) by mouth daily.  . meloxicam (MOBIC) 15 MG tablet Take 1 tablet (15 mg total) by mouth daily.  . traMADol (ULTRAM) 50 MG tablet Take 1 tablet po Q4 to 6 hours as needed for pain  . valACYclovir (VALTREX) 1000 MG tablet Take 1 tablet (1,000 mg total) by mouth 2  (two) times daily.  . [DISCONTINUED] nitrofurantoin, macrocrystal-monohydrate, (MACROBID) 100 MG capsule Take 1 capsule (100 mg total) by mouth 2 (two) times daily. (Patient not taking: Reported on 06/20/2020)  . [DISCONTINUED] sulfamethoxazole-trimethoprim (BACTRIM DS) 800-160 MG tablet Take 1 tablet by mouth 2 (two) times daily. (Patient not taking: Reported on 06/20/2020)   No facility-administered encounter medications on file as of 06/20/2020.    Surgical History: Past Surgical History:  Procedure Laterality Date  . BACK SURGERY  2017  . GALLBLADDER SURGERY  1999  . TOTAL VAGINAL HYSTERECTOMY  2016   bilateral salpingectomy at [facility] for abnormal uterine bleeding    Medical History: Past Medical History:  Diagnosis Date  . Hypertension     Family History: Family History  Problem Relation Age of Onset  . Leukemia Mother   . Hypertension Mother   . Diabetes Mother   . Lung cancer Father     Social History   Socioeconomic History  . Marital status: Single    Spouse name: Not on file  . Number of children: Not on file  . Years of education: Not on file  . Highest education level: Not on file  Occupational History  . Not on file  Tobacco Use  . Smoking status: Never Smoker  . Smokeless tobacco: Never Used  Substance and Sexual Activity  . Alcohol use: No  . Drug use: No  . Sexual activity: Yes    Birth control/protection:  Surgical  Other Topics Concern  . Not on file  Social History Narrative  . Not on file   Social Determinants of Health   Financial Resource Strain: Not on file  Food Insecurity: Not on file  Transportation Needs: Not on file  Physical Activity: Not on file  Stress: Not on file  Social Connections: Not on file  Intimate Partner Violence: Not on file      Review of Systems  Constitutional: Positive for fatigue. Negative for chills and diaphoresis.  HENT: Negative for ear pain, postnasal drip and sinus pressure.   Eyes: Negative for  photophobia, discharge, redness, itching and visual disturbance.  Respiratory: Negative for cough, shortness of breath and wheezing.   Cardiovascular: Negative for chest pain, palpitations and leg swelling.  Gastrointestinal: Negative for abdominal pain, constipation, diarrhea, nausea and vomiting.  Genitourinary: Negative for dysuria and flank pain.  Musculoskeletal: Negative for arthralgias, back pain, gait problem and neck pain.  Skin: Negative for color change.  Allergic/Immunologic: Negative for environmental allergies and food allergies.  Neurological: Negative for dizziness and headaches.  Hematological: Does not bruise/bleed easily.  Psychiatric/Behavioral: Negative for agitation, behavioral problems (depression) and hallucinations.    Vital Signs: BP (!) 134/92   Pulse 91   Temp (!) 97.2 F (36.2 C)   Resp 16   Ht 5\' 7"  (1.702 m)   Wt 223 lb 6.4 oz (101.3 kg)   SpO2 98%   BMI 34.99 kg/m    Physical Exam Vitals reviewed.  Cardiovascular:     Rate and Rhythm: Normal rate and regular rhythm.     Pulses: Normal pulses.     Heart sounds: Normal heart sounds.  Pulmonary:     Effort: Pulmonary effort is normal.     Breath sounds: Normal breath sounds.  Abdominal:     General: Abdomen is flat.     Palpations: Abdomen is soft.  Musculoskeletal:        General: Normal range of motion.     Cervical back: Normal range of motion.  Skin:    General: Skin is warm.  Neurological:     General: No focal deficit present.     Mental Status: She is oriented to person, place, and time. Mental status is at baseline.  Psychiatric:        Mood and Affect: Mood normal.        Behavior: Behavior normal.        Thought Content: Thought content normal.        Judgment: Judgment normal.    Assessment/Plan: 1. Essential hypertension, benign Advised to take HCTZ daily Wean herself from adderall Discussed echo//will continue discussion at next visit  2. Insomnia, unspecified  type Avoid use of adderall in the afternoon/evenings, discussed sleep hygiene, get to bed earlier to allow for longer sleep period Will continue discussions of OSA/PSG  3. Attention deficit disorder of adult Advised to wean herself from adderall, encouraged to take only as needed, goal to not take more than taking during the week-this will improve BP  4. Dysuria - POCT Urinalysis Dipstick - CULTURE, URINE COMPREHENSIVE  General Counseling: Amatullah verbalizes understanding of the findings of todays visit and agrees with plan of treatment. I have discussed any further diagnostic evaluation that may be needed or ordered today. We also reviewed her medications today. she has been encouraged to call the office with any questions or concerns that should arise related to todays visit.    Orders Placed This Encounter  Procedures  .  CULTURE, URINE COMPREHENSIVE  . POCT Urinalysis Dipstick    Time spent: 30 Minutes Time spent includes review of chart, medications, test results and follow-up plan with the patient.  This patient was seen by Theodoro Grist AGNP-C in Collaboration with Dr Lavera Guise as a part of collaborative care agreement     Tanna Furry. Tameisha Covell AGNP-C Internal medicine

## 2020-06-24 ENCOUNTER — Encounter: Payer: Self-pay | Admitting: Hospice and Palliative Medicine

## 2020-06-26 LAB — CULTURE, URINE COMPREHENSIVE

## 2020-07-03 ENCOUNTER — Ambulatory Visit: Payer: Self-pay | Admitting: Hospice and Palliative Medicine

## 2020-07-12 ENCOUNTER — Ambulatory Visit: Payer: Self-pay | Admitting: Hospice and Palliative Medicine

## 2020-07-12 ENCOUNTER — Other Ambulatory Visit: Payer: Self-pay

## 2020-07-12 ENCOUNTER — Encounter: Payer: Self-pay | Admitting: Hospice and Palliative Medicine

## 2020-07-12 VITALS — BP 124/88 | HR 80 | Temp 97.6°F | Resp 16 | Ht 67.0 in | Wt 224.6 lb

## 2020-07-12 DIAGNOSIS — I10 Essential (primary) hypertension: Secondary | ICD-10-CM

## 2020-07-12 DIAGNOSIS — F988 Other specified behavioral and emotional disorders with onset usually occurring in childhood and adolescence: Secondary | ICD-10-CM

## 2020-07-12 DIAGNOSIS — G8929 Other chronic pain: Secondary | ICD-10-CM

## 2020-07-12 DIAGNOSIS — M25562 Pain in left knee: Secondary | ICD-10-CM

## 2020-07-12 DIAGNOSIS — G4700 Insomnia, unspecified: Secondary | ICD-10-CM

## 2020-07-12 NOTE — Progress Notes (Signed)
[facility] [place], [place] [address]  Internal MEDICINE  Office Visit Note  Patient Name: Audrey Ball  [id number]  [id number]  Date of Service: 07/17/2020  Chief Complaint  Patient presents with  . Follow-up  . 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 cyclobenzaprine (FLEXERIL) 10 MG tablet Take 1/2 to 1 tablet po QHS prn back pain  . fluconazole (DIFLUCAN) 150 MG tablet Take 1 tablet po once. May repeat dose in 3 days as needed for persistent symptoms.  . hydrochlorothiazide (HYDRODIURIL) 25 MG tablet Take 1 tablet (25 mg total) by mouth daily.  . meloxicam (MOBIC) 15 MG tablet Take 1 tablet (15 mg total) by mouth daily.  . traMADol (ULTRAM) 50 MG tablet Take 1 tablet po Q4 to 6 hours as needed for pain  . valACYclovir (VALTREX) 1000 MG tablet Take 1 tablet (1,000 mg total) by mouth 2 (two) times daily.  . [DISCONTINUED] amphetamine-dextroamphetamine (ADDERALL) 20 MG tablet TAKE 1 TABLET BY MOUTH TWICE A DAY AS NEEDED FOR FOCUS   No facility-administered encounter medications on file as of 07/12/2020.    Surgical History: Past Surgical History:  Procedure Laterality Date  . BACK SURGERY  2017  . GALLBLADDER SURGERY  1999  . TOTAL VAGINAL HYSTERECTOMY  2016   bilateral salpingectomy at [facility] for  abnormal uterine bleeding    Medical History: Past Medical History:  Diagnosis Date  . Hypertension     Family History: Family History  Problem Relation Age of Onset  . Leukemia Mother   . Hypertension Mother   . Diabetes Mother   . Lung cancer Father     Social History   Socioeconomic History  . Marital status: Single    Spouse name: Not on file  . Number of children: Not on file  . Years of education: Not on file  . Highest education level: Not on file  Occupational History  . Not on file  Tobacco Use  . Smoking status: Never Smoker  . Smokeless tobacco: Never Used  Substance and Sexual Activity  . Alcohol use: No  . Drug use: No  . Sexual activity: Yes    Birth control/protection: Surgical  Other Topics Concern  . Not on file  Social History Narrative  . Not on file   Social Determinants of Health   Financial Resource Strain: Not on file  Food Insecurity: Not on file  Transportation Needs: Not on file  Physical Activity: Not on file  Stress: Not on file  Social Connections: Not on file  Intimate Partner Violence: Not on file      Review of Systems  Constitutional: Positive for fatigue. Negative for chills and diaphoresis.  HENT: Negative for ear pain, postnasal drip and sinus pressure.   Eyes: Negative for photophobia, discharge, redness, itching and visual  disturbance.  Respiratory: Negative for cough, shortness of breath and wheezing.   Cardiovascular: Negative for chest pain, palpitations and leg swelling.  Gastrointestinal: Negative for abdominal pain, constipation, diarrhea, nausea and vomiting.  Genitourinary: Negative for dysuria and flank pain.  Musculoskeletal: Negative for arthralgias, back pain, gait problem and neck pain.       Left knee pain  Skin: Negative for color change.  Allergic/Immunologic: Negative for environmental allergies and food allergies.  Neurological: Negative for dizziness and headaches.  Hematological: Does not  bruise/bleed easily.  Psychiatric/Behavioral: Positive for sleep disturbance. Negative for agitation, behavioral problems (depression) and hallucinations.    Vital Signs: BP 124/88   Pulse 80   Temp 97.6 F (36.4 C)   Resp 16   Ht 5\' 7"  (1.702 m)   Wt 224 lb 9.6 oz (101.9 kg)   SpO2 98%   BMI 35.18 kg/m    Physical Exam Vitals reviewed.  Constitutional:      Appearance: Normal appearance. She is obese.  Cardiovascular:     Rate and Rhythm: Normal rate and regular rhythm.     Pulses: Normal pulses.     Heart sounds: Normal heart sounds.  Pulmonary:     Effort: Pulmonary effort is normal.     Breath sounds: Normal breath sounds.  Abdominal:     General: Abdomen is flat.     Palpations: Abdomen is soft.  Musculoskeletal:        General: Normal range of motion.     Cervical back: Normal range of motion.  Skin:    General: Skin is warm.  Neurological:     General: No focal deficit present.     Mental Status: She is alert and oriented to person, place, and time. Mental status is at baseline.  Psychiatric:        Mood and Affect: Mood normal.        Behavior: Behavior normal.        Thought Content: Thought content normal.        Judgment: Judgment normal.    Assessment/Plan: 1. Essential hypertension Encouraged to continue HCTZ daily--monitor closely  2. Insomnia, unspecified type Not interested in medication at this time, discussed Melatonin as well as CBTi  3. Attention deficit disorder of adult May continue adderall as needed--discussed importance of only taking as needed  4. Chronic pain of left knee Will review labs--based on kidney function may resume Meloxicam daily or as needed and continue with acetaminophen, already established with ortho--discussed following up for further alternative options to control pain  General Counseling: Alizaya verbalizes understanding of the findings of todays visit and agrees with plan of treatment. I have discussed any  further diagnostic evaluation that may be needed or ordered today. We also reviewed her medications today. she has been encouraged to call the office with any questions or concerns that should arise related to todays visit.   Time spent: 30 Minutes Time spent includes review of chart, medications, test results and follow-up plan with the patient.  This patient was seen by Theodoro Grist AGNP-C in Collaboration with Dr Lavera Guise as a part of collaborative care agreement     Tanna Furry. Kaytlen Lightsey AGNP-C Internal medicine

## 2020-07-13 ENCOUNTER — Other Ambulatory Visit: Payer: Self-pay | Admitting: Hospice and Palliative Medicine

## 2020-07-13 ENCOUNTER — Telehealth: Payer: Self-pay | Admitting: Hospice and Palliative Medicine

## 2020-07-13 NOTE — Telephone Encounter (Signed)
Called and spoke to Ms. Haun regarding her labs. Kidney function remains normal. Slightly abnormal lipid levels--discussed healthy eating habits.

## 2020-07-17 ENCOUNTER — Encounter: Payer: Self-pay | Admitting: Hospice and Palliative Medicine

## 2020-08-11 ENCOUNTER — Other Ambulatory Visit: Payer: Self-pay | Admitting: Nurse Practitioner

## 2020-08-11 DIAGNOSIS — I10 Essential (primary) hypertension: Secondary | ICD-10-CM

## 2020-10-10 ENCOUNTER — Ambulatory Visit: Payer: Self-pay | Admitting: Hospice and Palliative Medicine

## 2021-01-05 ENCOUNTER — Other Ambulatory Visit: Payer: Self-pay

## 2021-01-05 ENCOUNTER — Encounter: Payer: Self-pay | Admitting: Emergency Medicine

## 2021-01-05 ENCOUNTER — Emergency Department
Admission: EM | Admit: 2021-01-05 | Discharge: 2021-01-05 | Disposition: A | Discharge status: Home / Self Care | Payer: 59 | Attending: Emergency Medicine | Admitting: Emergency Medicine

## 2021-01-05 DIAGNOSIS — Z79899 Other long term (current) drug therapy: Secondary | ICD-10-CM | POA: Insufficient documentation

## 2021-01-05 DIAGNOSIS — T7840XA Allergy, unspecified, initial encounter: Secondary | ICD-10-CM | POA: Insufficient documentation

## 2021-01-05 DIAGNOSIS — I10 Essential (primary) hypertension: Secondary | ICD-10-CM | POA: Insufficient documentation

## 2021-01-05 MED ORDER — DIPHENHYDRAMINE HCL 50 MG/ML IJ SOLN
50.0000 mg | Freq: Once | INTRAMUSCULAR | Status: AC
  Administered 2021-01-05: 50 mg via INTRAVENOUS
  Filled 2021-01-05: qty 1

## 2021-01-05 MED ORDER — DIPHENHYDRAMINE HCL 50 MG PO TABS: 25.0000 mg | ORAL_TABLET | Freq: Three times a day (TID) | ORAL | 0 refills | Status: DC | PRN

## 2021-01-05 MED ORDER — FAMOTIDINE IN NACL 20-0.9 MG/50ML-% IV SOLN
20.0000 mg | Freq: Once | INTRAVENOUS | Status: AC
  Administered 2021-01-05: 20 mg via INTRAVENOUS
  Filled 2021-01-05: qty 50

## 2021-01-05 MED ORDER — METHYLPREDNISOLONE SODIUM SUCC 125 MG IJ SOLR
125.0000 mg | Freq: Once | INTRAMUSCULAR | Status: AC
  Administered 2021-01-05: 125 mg via INTRAVENOUS
  Filled 2021-01-05: qty 2

## 2021-01-05 MED ORDER — PREDNISONE 50 MG PO TABS: ORAL_TABLET | ORAL | 0 refills | Status: DC

## 2021-01-05 MED ORDER — FAMOTIDINE 20 MG PO TABS: 20.0000 mg | ORAL_TABLET | Freq: Two times a day (BID) | ORAL | 0 refills | Status: DC

## 2021-01-05 NOTE — Discharge Instructions (Addendum)
Take 25 mg of Benadryl every 8 hours until symptoms resolve. 40 mg of Pepcid until symptoms resolve. Take prednisone once daily until symptoms resolve. Return to the emergency department for reevaluation if symptoms worsen at home.

## 2021-01-05 NOTE — ED Provider Notes (Signed)
[facility]-EMERGENCY DEPARTMENT  ____________________________________________  Time seen: Approximately 5:34 PM  I have reviewed the triage vital signs and the nursing notes.   HISTORY  Chief Complaint Allergic Reaction   Historian Patient     HPI Audrey Ball is a 50 y.o. female presents to the emergency department with urticaria after taking Bactrim.  Patient has a known sulfa allergy.  Patient reports that she took Bactrim despite acknowledging her allergy because she felt "so bad".  Patient has had cold-like symptoms for the past 4 days.  She denies chest pain, chest tightness, wheezing, vomiting, diarrhea or syncope.  No alleviating measures have been attempted.   Past Medical History:  Diagnosis Date   Hypertension      Immunizations up to date:  Yes.     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   Candidal vaginitis 06/15/2018   Encounter for long-term (current) use of medications 03/08/2018   Attention deficit disorder of adult 03/08/2018   Fever blister 03/08/2018   Cough 11/02/2015   Spinal stenosis of lumbar region with radiculopathy 11/02/2015   Abnormal thyroid function test 06/27/2014   Abnormal uterine bleeding (AUB) 06/27/2014   Lumbosacral radiculopathy at S1 11/25/2013   Chronic pain of left knee 05/21/2007   Essential hypertension, benign 04/20/2007   GERD 04/20/2007    Past Surgical History:  Procedure Laterality Date    BACK SURGERY  2017   [place]   TOTAL VAGINAL HYSTERECTOMY  2016   bilateral salpingectomy at [facility] for abnormal uterine bleeding    Prior to Admission medications   Medication Sig Start Date End Date Taking? Authorizing Provider  diphenhydrAMINE (BENADRYL) 50 MG tablet Take 0.5 tablets (25 mg total) by mouth every 8 (eight) hours as needed for up to 5 days for itching. 01/05/21 01/10/21 Yes Vallarie Mare M, PA-C  famotidine (PEPCID) 20 MG tablet Take 1 tablet (20 mg total) by mouth 2 (two) times daily for 5 days. 01/05/21 01/10/21 Yes Vallarie Mare M, PA-C  predniSONE (DELTASONE) 50 MG tablet Take one tablet once daily for the next five days. 01/05/21  Yes Vallarie Mare M, PA-C  cyclobenzaprine (FLEXERIL) 10 MG tablet Take 1/2 to 1 tablet po QHS prn back pain 05/02/19   Ronnell Freshwater, NP  fluconazole (DIFLUCAN) 150 MG tablet Take 1 tablet po once. May repeat dose in 3 days as needed for persistent symptoms. 11/07/19   Ronnell Freshwater, NP  hydrochlorothiazide (HYDRODIURIL) 25 MG tablet TAKE 1 TABLET BY MOUTH EVERY DAY 08/13/20   Lavera Guise, MD  meloxicam (MOBIC) 15 MG tablet Take 1 tablet (15 mg total) by mouth daily. 02/06/20   Ronnell Freshwater, NP  traMADol (ULTRAM) 50 MG tablet Take 1 tablet po Q4 to 6 hours as needed for pain 02/06/20   Ronnell Freshwater, NP  valACYclovir (VALTREX) 1000 MG tablet Take 1 tablet (1,000 mg total) by mouth 2 (two) times daily. 09/21/18   Kendell Bane, NP  amphetamine-dextroamphetamine (ADDERALL) 20 MG tablet TAKE 1 TABLET BY MOUTH TWICE A DAY AS NEEDED FOR FOCUS 05/23/20 07/12/20  Ronnell Freshwater, NP    Allergies Penicillamine, Penicillins, Propoxyphene, Propoxyphene n-acetaminophen, and Sulfa antibiotics  Family History  Problem Relation Age of Onset   Leukemia Mother    Hypertension Mother    Diabetes Mother    Lung cancer Father     Social History Social History   Tobacco Use   Smoking status: Never   Smokeless tobacco: Never   Substance Use Topics   Alcohol use: No   Drug use: No     Review of Systems  Constitutional: No fever/chills Eyes:  No discharge ENT: No upper respiratory complaints. Respiratory: no cough. No SOB/ use of accessory muscles to breath Gastrointestinal:   No nausea, no vomiting.  No diarrhea.  No constipation. Musculoskeletal: Negative for musculoskeletal pain. Skin: Patient has urticaria.     ____________________________________________   PHYSICAL EXAM:  VITAL SIGNS: ED Triage Vitals  Enc Vitals Group     BP 01/05/21 1719 (!) 170/93     Pulse Rate 01/05/21 1719 63     Resp 01/05/21 1719 20     Temp 01/05/21 1719 98.7 F (37.1 C)     Temp Source 01/05/21 1719 Oral     SpO2 01/05/21 1719 96 %     Weight 01/05/21 1714 223 lb (101.2 kg)     Height 01/05/21 1714 5\' 7"  (1.702 m)     Head Circumference --      Peak Flow --      Pain Score 01/05/21 1714 0     Pain Loc --      Pain Edu? --      Excl. in [place]? --      Constitutional: Alert and oriented. Well appearing and in no acute distress. Eyes: Conjunctivae are normal. PERRL. EOMI. Head: Atraumatic. ENT: Cardiovascular: Normal rate, regular rhythm. Normal S1 and S2.  Good peripheral circulation. Respiratory: Normal respiratory effort without tachypnea or retractions. Lungs CTAB. Good air entry to the bases with no decreased or absent breath sounds Gastrointestinal: Bowel sounds x 4 quadrants. Soft and nontender to palpation. No guarding or rigidity. No distention. Musculoskeletal: Full range of motion to all extremities. No obvious deformities noted Neurologic:  Normal for age. No gross focal neurologic deficits are appreciated.  Skin: Patient has diffuse urticaria. Psychiatric: Mood and affect are normal for age. Speech and behavior are normal.   ____________________________________________   LABS (all labs ordered are listed, but only abnormal results are displayed)  Labs Reviewed - No data to  display ____________________________________________  EKG   ____________________________________________  RADIOLOGY   No results found.  ____________________________________________    PROCEDURES  Procedure(s) performed:     Procedures     Medications  methylPREDNISolone sodium succinate (SOLU-MEDROL) 125 mg/2 mL injection 125 mg (125 mg Intravenous Given 01/05/21 1753)  famotidine (PEPCID) IVPB 20 mg premix (0 mg Intravenous Stopped 01/05/21 1834)  diphenhydrAMINE (BENADRYL) injection 50 mg (50 mg Intravenous Given 01/05/21 1753)     ____________________________________________   INITIAL IMPRESSION / ASSESSMENT AND PLAN / ED COURSE  Pertinent labs & imaging results that were available during my care of the patient were reviewed by me and considered in my medical decision making (see chart for details).      Assessment and Plan: Allergic reaction:  50 year old female Presents to the emergency department with urticaria after taking Bactrim despite known sulfa allergy.  Patient was hypertensive at triage  but vital signs were otherwise reassuring.  Patient was given IV Solu-Medrol, Benadryl and Pepcid in the emergency department and her symptoms improved significantly.  She was discharged with prednisone, Benadryl and Pepcid and advised to take medications until her symptoms resolve.  Return precautions were given to return if symptoms worsen at home.  All patient questions were answered.      ____________________________________________  FINAL CLINICAL IMPRESSION(S) / ED DIAGNOSES  Final diagnoses:  Allergic reaction, initial encounter      NEW MEDICATIONS STARTED DURING THIS VISIT:  ED Discharge Orders          Ordered    predniSONE (DELTASONE) 50 MG tablet        01/05/21 1947    diphenhydrAMINE (BENADRYL) 50 MG tablet  Every 8 hours PRN        01/05/21 1947    famotidine (PEPCID) 20 MG tablet  2 times daily        01/05/21 1947                 This chart was dictated using voice recognition software/Dragon. Despite best efforts to proofread, errors can occur which can change the meaning. Any change was purely unintentional.     Lannie Fields, PA-C 01/05/21 2259    Harvest Dark, MD 01/13/21 1359

## 2021-01-07 ENCOUNTER — Telehealth: Payer: Self-pay

## 2021-01-07 NOTE — Telephone Encounter (Signed)
Pt called after hours phone and advised that she felt like she had a sinus infection and she took some old medication she had at home and it made her feel better then later she broke out in hives and her lip was swollen and leg.  Per [facility] we advised pt that she needed to go to ER as soon as possible that she has had a allergic reaction to the old medication.  Pt stated that she took this medication before and that it did the same thing to her then.  I informed pt that she doesn't need to take any more of that medication.

## 2021-01-08 ENCOUNTER — Telehealth: Payer: Self-pay

## 2021-01-08 NOTE — Telephone Encounter (Signed)
Tried to call pt to check up on her from her allergic reaction to a medication she took (?Bactrim) didn't get an answer and Mailbox was full not able to leave a message

## 2021-04-02 ENCOUNTER — Other Ambulatory Visit: Payer: Self-pay | Admitting: Internal Medicine

## 2021-04-02 DIAGNOSIS — I10 Essential (primary) hypertension: Secondary | ICD-10-CM

## 2021-04-02 NOTE — Telephone Encounter (Signed)
Pt need appt for refills  ?

## 2021-04-08 ENCOUNTER — Other Ambulatory Visit: Payer: Self-pay

## 2021-04-08 ENCOUNTER — Encounter: Payer: Self-pay | Admitting: Nurse Practitioner

## 2021-04-08 ENCOUNTER — Ambulatory Visit: Payer: BC Managed Care – PPO | Admitting: Nurse Practitioner

## 2021-04-08 DIAGNOSIS — M25562 Pain in left knee: Secondary | ICD-10-CM | POA: Diagnosis not present

## 2021-04-08 DIAGNOSIS — B3731 Acute candidiasis of vulva and vagina: Secondary | ICD-10-CM

## 2021-04-08 DIAGNOSIS — M62838 Other muscle spasm: Secondary | ICD-10-CM

## 2021-04-08 DIAGNOSIS — I10 Essential (primary) hypertension: Secondary | ICD-10-CM

## 2021-04-08 DIAGNOSIS — M545 Low back pain, unspecified: Secondary | ICD-10-CM

## 2021-04-08 DIAGNOSIS — G8929 Other chronic pain: Secondary | ICD-10-CM

## 2021-04-08 DIAGNOSIS — F988 Other specified behavioral and emotional disorders with onset usually occurring in childhood and adolescence: Secondary | ICD-10-CM

## 2021-04-08 MED ORDER — HYDROCHLOROTHIAZIDE 25 MG PO TABS: 25.0000 mg | ORAL_TABLET | Freq: Every day | ORAL | 0 refills | Status: DC

## 2021-04-08 MED ORDER — AMPHETAMINE-DEXTROAMPHETAMINE 20 MG PO TABS: ORAL_TABLET | ORAL | 0 refills | Status: DC

## 2021-04-08 MED ORDER — FLUCONAZOLE 150 MG PO TABS: ORAL_TABLET | ORAL | 1 refills | Status: DC

## 2021-04-08 MED ORDER — CYCLOBENZAPRINE HCL 10 MG PO TABS: ORAL_TABLET | ORAL | 0 refills | Status: DC

## 2021-04-08 MED ORDER — TRAMADOL HCL 50 MG PO TABS: ORAL_TABLET | ORAL | 0 refills | Status: DC

## 2021-04-08 MED ORDER — MELOXICAM 15 MG PO TABS: 15.0000 mg | ORAL_TABLET | Freq: Every day | ORAL | 1 refills | Status: DC

## 2021-04-08 NOTE — Progress Notes (Signed)
[facility] [place], [place] [address]  Internal MEDICINE  Office Visit Note  Patient Name: Audrey Ball  [id number]  [id number]  Date of Service: 04/08/2021  Chief Complaint  Patient presents with   Follow-up    refills   Hypertension    HPI Audrey Ball presents for a follow up visit for hypertension and medication refills. She has hypertension ADHD, and chronic back pain. She is tolerating current dose of ADHD medication. Her blood pressure is well controlled at this time.     Current Medication: Outpatient Encounter Medications as of 04/08/2021  Medication Sig   valACYclovir (VALTREX) 1000 MG tablet Take 1 tablet (1,000 mg total) by mouth 2 (two) times daily.   [DISCONTINUED] cyclobenzaprine (FLEXERIL) 10 MG tablet Take 1/2 to 1 tablet po QHS prn back pain   [DISCONTINUED] fluconazole (DIFLUCAN) 150 MG tablet Take 1 tablet po once. May repeat dose in 3 days as needed for persistent symptoms.   [DISCONTINUED] hydrochlorothiazide (HYDRODIURIL) 25 MG tablet TAKE 1 TABLET BY MOUTH EVERY DAY   [DISCONTINUED] meloxicam (MOBIC) 15 MG tablet Take 1 tablet (15 mg total) by mouth daily.   [DISCONTINUED] predniSONE (DELTASONE) 50 MG tablet Take one tablet once daily for the next five days.   [DISCONTINUED] traMADol (ULTRAM) 50 MG tablet Take 1 tablet po Q4 to 6 hours as needed for pain   amphetamine-dextroamphetamine (ADDERALL) 20 MG tablet TAKE 1 TABLET BY MOUTH TWICE A DAY AS NEEDED FOR FOCUS   [START ON 05/06/2021] amphetamine-dextroamphetamine (ADDERALL) 20 MG tablet TAKE 1 TABLET BY MOUTH TWICE A DAY AS NEEDED FOR FOCUS   [START ON 06/03/2021] amphetamine-dextroamphetamine (ADDERALL) 20 MG tablet TAKE 1 TABLET BY MOUTH TWICE A DAY AS NEEDED FOR FOCUS   cyclobenzaprine (FLEXERIL) 10 MG tablet Take 1/2 to 1 tablet po QHS prn back pain   diphenhydrAMINE (BENADRYL) 50 MG tablet Take 0.5 tablets (25 mg total) by mouth every 8 (eight) hours as needed for up to 5  days for itching.   fluconazole (DIFLUCAN) 150 MG tablet Take 1 tablet po once. May repeat dose in 3 days as needed for persistent symptoms.   hydrochlorothiazide (HYDRODIURIL) 25 MG tablet Take 1 tablet (25 mg total) by mouth daily.   meloxicam (MOBIC) 15 MG tablet Take 1 tablet (15 mg total) by mouth daily.   traMADol (ULTRAM) 50 MG tablet Take 1 tablet po Q4 to 6 hours as needed for pain   [DISCONTINUED] amphetamine-dextroamphetamine (ADDERALL) 20 MG tablet TAKE 1 TABLET BY MOUTH TWICE A DAY AS NEEDED FOR FOCUS   [DISCONTINUED] famotidine (PEPCID) 20 MG tablet Take 1 tablet (20 mg total) by mouth 2 (two) times daily for 5 days.   No facility-administered encounter medications on file as of 04/08/2021.    Surgical History: Past Surgical History:  Procedure Laterality Date   BACK SURGERY  2017   [place]   TOTAL VAGINAL HYSTERECTOMY  2016   bilateral salpingectomy at [facility] for abnormal uterine bleeding    Medical History: Past Medical History:  Diagnosis Date   Hypertension     Family History: Family History  Problem Relation Age of Onset   Leukemia Mother    Hypertension Mother    Diabetes Mother    Lung cancer Father     Social History   Socioeconomic History   Marital status: Single    Spouse name: Not on file   Number of children: Not on file   Years of education: Not on  file   Highest education level: Not on file  Occupational History   Not on file  Tobacco Use   Smoking status: Never   Smokeless tobacco: Never  Substance and Sexual Activity   Alcohol use: No   Drug use: No   Sexual activity: Yes    Birth control/protection: Surgical  Other Topics Concern   Not on file  Social History Narrative   Not on file   Social Determinants of Health   Financial Resource Strain: Not on file  Food Insecurity: Not on file  Transportation Needs: Not on file  Physical Activity: Not on file  Stress: Not on file  Social Connections: Not on file   Intimate Partner Violence: Not on file      Review of Systems  Constitutional:  Negative for chills, fatigue and unexpected weight change.  HENT:  Negative for congestion, rhinorrhea, sneezing and sore throat.   Eyes:  Negative for redness.  Respiratory:  Negative for cough, chest tightness and shortness of breath.   Cardiovascular:  Negative for chest pain and palpitations.  Gastrointestinal:  Negative for abdominal pain, constipation, diarrhea, nausea and vomiting.  Genitourinary:  Negative for dysuria and frequency.  Musculoskeletal:  Negative for arthralgias, back pain, joint swelling and neck pain.  Skin:  Negative for rash.  Neurological: Negative.  Negative for tremors and numbness.  Hematological:  Negative for adenopathy. Does not bruise/bleed easily.  Psychiatric/Behavioral:  Negative for behavioral problems (Depression), sleep disturbance and suicidal ideas. The patient is not nervous/anxious.    Vital Signs: BP (!) 152/88   Pulse 70   Temp 98.6 F (37 C)   Resp 16   Ht 5' 6.5" (1.689 m)   Wt 225 lb 3.2 oz (102.2 kg)   SpO2 98%   BMI 35.80 kg/m    Physical Exam Vitals reviewed.  Constitutional:      General: She is not in acute distress.    Appearance: Normal appearance. She is obese. She is not ill-appearing.  HENT:     Head: Normocephalic and atraumatic.  Eyes:     Extraocular Movements: Extraocular movements intact.     Pupils: Pupils are equal, round, and reactive to light.  Cardiovascular:     Rate and Rhythm: Normal rate and regular rhythm.  Pulmonary:     Effort: Pulmonary effort is normal. No respiratory distress.  Neurological:     Mental Status: She is alert and oriented to person, place, and time.     Cranial Nerves: No cranial nerve deficit.     Coordination: Coordination normal.     Gait: Gait normal.  Psychiatric:        Mood and Affect: Mood normal.        Behavior: Behavior normal.       Assessment/Plan: 1. Essential  hypertension, benign Blood pressure is stable, refill ordered.  - hydrochlorothiazide (HYDRODIURIL) 25 MG tablet; Take 1 tablet (25 mg total) by mouth daily.  Dispense: 30 tablet; Refill: 0  2. Night muscle spasms Controlled with cyclobenzaprine, refill ordered.  - cyclobenzaprine (FLEXERIL) 10 MG tablet; Take 1/2 to 1 tablet po QHS prn back pain  Dispense: 30 tablet; Refill: 0  3. Candidal vaginitis Patient prefers to have some on hand just in case she has a yeast infection.  - fluconazole (DIFLUCAN) 150 MG tablet; Take 1 tablet po once. May repeat dose in 3 days as needed for persistent symptoms.  Dispense: 3 tablet; Refill: 1  4. Chronic pain of left knee Takes meloxicam  daily for chronic arthritic pain, refill ordered.  - meloxicam (MOBIC) 15 MG tablet; Take 1 tablet (15 mg total) by mouth daily.  Dispense: 90 tablet; Refill: 1  5. Chronic bilateral low back pain without sciatica Takes tramadol as needed, refill ordered.  - traMADol (ULTRAM) 50 MG tablet; Take 1 tablet po Q4 to 6 hours as needed for pain  Dispense: 30 tablet; Refill: 0  6. Attention deficit disorder of adult Refills for 3 months ordered, will need UDS at next office visit - amphetamine-dextroamphetamine (ADDERALL) 20 MG tablet; TAKE 1 TABLET BY MOUTH TWICE A DAY AS NEEDED FOR FOCUS  Dispense: 60 tablet; Refill: 0 - amphetamine-dextroamphetamine (ADDERALL) 20 MG tablet; TAKE 1 TABLET BY MOUTH TWICE A DAY AS NEEDED FOR FOCUS  Dispense: 60 tablet; Refill: 0 - amphetamine-dextroamphetamine (ADDERALL) 20 MG tablet; TAKE 1 TABLET BY MOUTH TWICE A DAY AS NEEDED FOR FOCUS  Dispense: 60 tablet; Refill: 0   General Counseling: Audrey Ball verbalizes understanding of the findings of todays visit and agrees with plan of treatment. I have discussed any further diagnostic evaluation that may be needed or ordered today. We also reviewed her medications today. she has been encouraged to call the office with any questions or concerns that  should arise related to todays visit.    No orders of the defined types were placed in this encounter.   Meds ordered this encounter  Medications   cyclobenzaprine (FLEXERIL) 10 MG tablet    Sig: Take 1/2 to 1 tablet po QHS prn back pain    Dispense:  30 tablet    Refill:  0   fluconazole (DIFLUCAN) 150 MG tablet    Sig: Take 1 tablet po once. May repeat dose in 3 days as needed for persistent symptoms.    Dispense:  3 tablet    Refill:  1   hydrochlorothiazide (HYDRODIURIL) 25 MG tablet    Sig: Take 1 tablet (25 mg total) by mouth daily.    Dispense:  30 tablet    Refill:  0   meloxicam (MOBIC) 15 MG tablet    Sig: Take 1 tablet (15 mg total) by mouth daily.    Dispense:  90 tablet    Refill:  1   traMADol (ULTRAM) 50 MG tablet    Sig: Take 1 tablet po Q4 to 6 hours as needed for pain    Dispense:  30 tablet    Refill:  0   amphetamine-dextroamphetamine (ADDERALL) 20 MG tablet    Sig: TAKE 1 TABLET BY MOUTH TWICE A DAY AS NEEDED FOR FOCUS    Dispense:  60 tablet    Refill:  0    October fill   amphetamine-dextroamphetamine (ADDERALL) 20 MG tablet    Sig: TAKE 1 TABLET BY MOUTH TWICE A DAY AS NEEDED FOR FOCUS    Dispense:  60 tablet    Refill:  0    November fill   amphetamine-dextroamphetamine (ADDERALL) 20 MG tablet    Sig: TAKE 1 TABLET BY MOUTH TWICE A DAY AS NEEDED FOR FOCUS    Dispense:  60 tablet    Refill:  0    december fill    Return in about 3 months (around 07/09/2021) for F/U, ADHD med check, Kathey [facility]er PCP.   Total time spent:30 Minutes Time spent includes review of chart, medications, test results, and follow up plan with the patient.   [place] Controlled Substance Database was reviewed by me.  This patient was seen by Jonetta Osgood, FNP-C in  collaboration with Dr. Clayborn Bigness as a part of collaborative care agreement.   Mickal Meno R. Valetta Fuller, MSN, FNP-C Internal medicine

## 2021-04-22 ENCOUNTER — Telehealth: Payer: Self-pay

## 2021-04-22 MED ORDER — TRAMADOL HCL 50 MG PO TABS: 50.0000 mg | ORAL_TABLET | Freq: Every evening | ORAL | 0 refills | Status: DC | PRN

## 2021-04-24 NOTE — Telephone Encounter (Signed)
error 

## 2021-05-01 ENCOUNTER — Other Ambulatory Visit: Payer: Self-pay | Admitting: Internal Medicine

## 2021-05-01 DIAGNOSIS — I10 Essential (primary) hypertension: Secondary | ICD-10-CM

## 2021-05-02 ENCOUNTER — Encounter: Payer: BC Managed Care – PPO | Admitting: Physician Assistant

## 2021-05-22 DIAGNOSIS — M1712 Unilateral primary osteoarthritis, left knee: Secondary | ICD-10-CM | POA: Diagnosis not present

## 2021-05-31 DIAGNOSIS — U071 COVID-19: Secondary | ICD-10-CM | POA: Diagnosis not present

## 2021-05-31 DIAGNOSIS — R52 Pain, unspecified: Secondary | ICD-10-CM | POA: Diagnosis not present

## 2021-07-09 ENCOUNTER — Ambulatory Visit: Payer: BC Managed Care – PPO | Admitting: Nurse Practitioner

## 2021-07-16 ENCOUNTER — Ambulatory Visit: Payer: BC Managed Care – PPO | Admitting: Nurse Practitioner

## 2021-07-16 ENCOUNTER — Telehealth: Payer: Self-pay

## 2021-07-16 ENCOUNTER — Other Ambulatory Visit: Payer: Self-pay

## 2021-07-16 ENCOUNTER — Encounter: Payer: Self-pay | Admitting: Nurse Practitioner

## 2021-07-16 VITALS — BP 130/80 | HR 61 | Temp 98.5°F | Resp 16 | Ht 67.0 in | Wt 226.8 lb

## 2021-07-16 DIAGNOSIS — F988 Other specified behavioral and emotional disorders with onset usually occurring in childhood and adolescence: Secondary | ICD-10-CM | POA: Diagnosis not present

## 2021-07-16 DIAGNOSIS — Z1231 Encounter for screening mammogram for malignant neoplasm of breast: Secondary | ICD-10-CM | POA: Diagnosis not present

## 2021-07-16 DIAGNOSIS — R42 Dizziness and giddiness: Secondary | ICD-10-CM | POA: Diagnosis not present

## 2021-07-16 DIAGNOSIS — Z1212 Encounter for screening for malignant neoplasm of rectum: Secondary | ICD-10-CM

## 2021-07-16 DIAGNOSIS — Z1211 Encounter for screening for malignant neoplasm of colon: Secondary | ICD-10-CM | POA: Diagnosis not present

## 2021-07-16 MED ORDER — AMPHETAMINE-DEXTROAMPHETAMINE 20 MG PO TABS: ORAL_TABLET | ORAL | 0 refills | Status: DC

## 2021-07-16 MED ORDER — MECLIZINE HCL 12.5 MG PO TABS: 12.5000 mg | ORAL_TABLET | Freq: Every evening | ORAL | 0 refills | Status: DC | PRN

## 2021-07-16 NOTE — Progress Notes (Signed)
[facility],  [address]  Internal MEDICINE  Office Visit Note  Patient Name: Audrey Ball  [id number]  [id number]  Date of Service: 07/16/2021  Chief Complaint  Patient presents with   Follow-up   Hypertension   Medication Refill   Dizziness    Started about a month and a half ago    HPI Audrey Ball presents for a follow up visit for ADHD refills. She is also experiencing dizziness and lightheadedness. She experiences it intermittently and it has been going on for a month a a half.  Happens more when getting in the bed at nighttime and first thing in the morning. She is due for refills of adderall and reports that the current dose is working well for her. She denies any adverse side effects of the medication. Her blood pressure and heart rate are wnl.    Current Medication: Outpatient Encounter Medications as of 07/16/2021  Medication Sig   cyclobenzaprine (FLEXERIL) 10 MG tablet Take 1/2 to 1 tablet po QHS prn back pain   fluconazole (DIFLUCAN) 150 MG tablet Take 1 tablet po once. May repeat dose in 3 days as needed for persistent symptoms.   hydrochlorothiazide (HYDRODIURIL) 25 MG tablet TAKE 1 TABLET BY MOUTH EVERY DAY   meclizine (ANTIVERT) 12.5 MG tablet Take 1-2 tablets (12.5-25 mg total) by mouth at bedtime as needed for dizziness or nausea.   meloxicam (MOBIC) 15 MG tablet Take 1 tablet (15 mg total) by mouth daily.   traMADol (ULTRAM) 50 MG tablet Take 1 tablet (50 mg total) by mouth at bedtime as needed for moderate pain or severe pain.   valACYclovir (VALTREX) 1000 MG tablet Take 1 tablet (1,000 mg total) by mouth 2 (two) times daily.   [DISCONTINUED] amphetamine-dextroamphetamine (ADDERALL) 20 MG tablet TAKE 1 TABLET BY MOUTH TWICE A DAY AS NEEDED FOR FOCUS   [DISCONTINUED] amphetamine-dextroamphetamine (ADDERALL) 20 MG tablet TAKE 1 TABLET BY MOUTH TWICE A DAY AS NEEDED FOR FOCUS   [DISCONTINUED] amphetamine-dextroamphetamine  (ADDERALL) 20 MG tablet TAKE 1 TABLET BY MOUTH TWICE A DAY AS NEEDED FOR FOCUS   amphetamine-dextroamphetamine (ADDERALL) 20 MG tablet TAKE 1 TABLET BY MOUTH TWICE A DAY AS NEEDED FOR FOCUS   [START ON 08/13/2021] amphetamine-dextroamphetamine (ADDERALL) 20 MG tablet TAKE 1 TABLET BY MOUTH TWICE A DAY AS NEEDED FOR FOCUS   [START ON 09/10/2021] amphetamine-dextroamphetamine (ADDERALL) 20 MG tablet TAKE 1 TABLET BY MOUTH TWICE A DAY AS NEEDED FOR FOCUS   diphenhydrAMINE (BENADRYL) 50 MG tablet Take 0.5 tablets (25 mg total) by mouth every 8 (eight) hours as needed for up to 5 days for itching.   No facility-administered encounter medications on file as of 07/16/2021.    Surgical History: Past Surgical History:  Procedure Laterality Date   BACK SURGERY  2017   [place]   TOTAL VAGINAL HYSTERECTOMY  2016   bilateral salpingectomy at [facility] for abnormal uterine bleeding    Medical History: Past Medical History:  Diagnosis Date   Hypertension     Family History: Family History  Problem Relation Age of Onset   Leukemia Mother    Hypertension Mother    Diabetes Mother    Lung cancer Father     Social History   Socioeconomic History   Marital status: Single    Spouse name: Not on file   Number of children: Not on file   Years of education: Not on file   Highest education level: Not on  file  Occupational History   Not on file  Tobacco Use   Smoking status: Never   Smokeless tobacco: Never  Substance and Sexual Activity   Alcohol use: No   Drug use: No   Sexual activity: Yes    Birth control/protection: Surgical  Other Topics Concern   Not on file  Social History Narrative   Not on file   Social Determinants of Health   Financial Resource Strain: Not on file  Food Insecurity: Not on file  Transportation Needs: Not on file  Physical Activity: Not on file  Stress: Not on file  Social Connections: Not on file  Intimate Partner Violence: Not on file       Review of Systems  Constitutional:  Negative for chills, fatigue and unexpected weight change.  HENT:  Negative for congestion, rhinorrhea, sneezing and sore throat.   Eyes:  Negative for redness.  Respiratory:  Negative for cough, chest tightness and shortness of breath.   Cardiovascular:  Negative for chest pain and palpitations.  Gastrointestinal:  Negative for abdominal pain, constipation, diarrhea, nausea and vomiting.  Genitourinary:  Negative for dysuria and frequency.  Musculoskeletal:  Negative for arthralgias, back pain, joint swelling and neck pain.  Skin:  Negative for rash.  Neurological:  Positive for dizziness and light-headedness. Negative for tremors, numbness and headaches.  Hematological:  Negative for adenopathy. Does not bruise/bleed easily.  Psychiatric/Behavioral:  Negative for behavioral problems (Depression), sleep disturbance and suicidal ideas. The patient is not nervous/anxious.    Vital Signs: BP 130/80    Pulse 61    Temp 98.5 F (36.9 C)    Resp 16    Ht 5\' 7"  (1.702 m)    Wt 226 lb 12.8 oz (102.9 kg)    SpO2 98%    BMI 35.52 kg/m    Physical Exam Vitals reviewed.  Constitutional:      General: She is not in acute distress.    Appearance: Normal appearance. She is obese. She is not ill-appearing.  HENT:     Head: Normocephalic and atraumatic.  Eyes:     Pupils: Pupils are equal, round, and reactive to light.  Cardiovascular:     Rate and Rhythm: Normal rate and regular rhythm.  Pulmonary:     Effort: Pulmonary effort is normal. No respiratory distress.  Neurological:     Mental Status: She is alert and oriented to person, place, and time.     Cranial Nerves: No cranial nerve deficit.     Coordination: Coordination normal.     Gait: Gait normal.  Psychiatric:        Mood and Affect: Mood normal.        Behavior: Behavior normal.       Assessment/Plan: 1. Dizziness Meclizine prescribed prn to alleviate dizziness.  - meclizine  (ANTIVERT) 12.5 MG tablet; Take 1-2 tablets (12.5-25 mg total) by mouth at bedtime as needed for dizziness or nausea.  Dispense: 60 tablet; Refill: 0  2. Encounter for screening mammogram for malignant neoplasm of breast Routine mammogram ordered - MM 3D SCREEN BREAST BILATERAL; Future  3. Screening for colorectal cancer Due for routine colonoscopy, Referred to GI - Ambulatory referral to Gastroenterology  4. Attention deficit disorder of adult Current medication and dose are effective, refills sent to pharmacy, follow up in 3 months for additional refills, will also need UDS at next office visit.  - amphetamine-dextroamphetamine (ADDERALL) 20 MG tablet; TAKE 1 TABLET BY MOUTH TWICE A DAY AS NEEDED FOR FOCUS  Dispense: 60 tablet; Refill: 0 - amphetamine-dextroamphetamine (ADDERALL) 20 MG tablet; TAKE 1 TABLET BY MOUTH TWICE A DAY AS NEEDED FOR FOCUS  Dispense: 60 tablet; Refill: 0 - amphetamine-dextroamphetamine (ADDERALL) 20 MG tablet; TAKE 1 TABLET BY MOUTH TWICE A DAY AS NEEDED FOR FOCUS  Dispense: 60 tablet; Refill: 0     General Counseling: Audrey Ball verbalizes understanding of the findings of todays visit and agrees with plan of treatment. I have discussed any further diagnostic evaluation that may be needed or ordered today. We also reviewed her medications today. she has been encouraged to call the office with any questions or concerns that should arise related to todays visit.    Orders Placed This Encounter  Procedures   MM 3D SCREEN BREAST BILATERAL    Meds ordered this encounter  Medications   amphetamine-dextroamphetamine (ADDERALL) 20 MG tablet    Sig: TAKE 1 TABLET BY MOUTH TWICE A DAY AS NEEDED FOR FOCUS    Dispense:  60 tablet    Refill:  0    january fill   amphetamine-dextroamphetamine (ADDERALL) 20 MG tablet    Sig: TAKE 1 TABLET BY MOUTH TWICE A DAY AS NEEDED FOR FOCUS    Dispense:  60 tablet    Refill:  0    February fill   amphetamine-dextroamphetamine  (ADDERALL) 20 MG tablet    Sig: TAKE 1 TABLET BY MOUTH TWICE A DAY AS NEEDED FOR FOCUS    Dispense:  60 tablet    Refill:  0    march fill   meclizine (ANTIVERT) 12.5 MG tablet    Sig: Take 1-2 tablets (12.5-25 mg total) by mouth at bedtime as needed for dizziness or nausea.    Dispense:  60 tablet    Refill:  0    Return in about 3 months (around 10/14/2021) for F/U, ADHD med check, Charlen Bakula PCP, need UDS at next office visit. .   Total time spent:30 Minutes Time spent includes review of chart, medications, test results, and follow up plan with the patient.    Controlled Substance Database was reviewed by me.  This patient was seen by Jonetta Osgood, FNP-C in collaboration with Dr. Clayborn Bigness as a part of collaborative care agreement.   Mayra Brahm R. Valetta Fuller, MSN, FNP-C Internal medicine

## 2021-07-16 NOTE — Telephone Encounter (Signed)
CALLED PATIENT NO ANSWER LEFT VOICEMAIL FOR A CALL BACK ? ?

## 2021-07-17 ENCOUNTER — Telehealth: Payer: Self-pay

## 2021-07-17 ENCOUNTER — Other Ambulatory Visit: Payer: Self-pay

## 2021-07-17 DIAGNOSIS — Z1211 Encounter for screening for malignant neoplasm of colon: Secondary | ICD-10-CM

## 2021-07-17 MED ORDER — PEG 3350-KCL-NA BICARB-NACL 420 G PO SOLR: 4000.0000 mL | Freq: Once | ORAL | 0 refills | Status: AC

## 2021-07-17 NOTE — Telephone Encounter (Signed)
Scheduled

## 2021-07-17 NOTE — Progress Notes (Signed)
Gastroenterology Pre-Procedure Review  Request Date: 08/16/2021 Requesting Physician: Dr. Marius Ditch  PATIENT REVIEW QUESTIONS: The patient responded to the following health history questions as indicated:    1. Are you having any GI issues? no 2. Do you have a personal history of Polyps? no 3. Do you have a family history of Colon Cancer or Polyps? no 4. Diabetes Mellitus? no 5. Joint replacements in the past 12 months?no 6. Major health problems in the past 3 months?no 7. Any artificial heart valves, MVP, or defibrillator?no    MEDICATIONS & ALLERGIES:    Patient reports the following regarding taking any anticoagulation/antiplatelet therapy:   Plavix, Coumadin, Eliquis, Xarelto, Lovenox, Pradaxa, Brilinta, or Effient? no Aspirin? no  Patient confirms/reports the following medications:  Current Outpatient Medications  Medication Sig Dispense Refill   amphetamine-dextroamphetamine (ADDERALL) 20 MG tablet TAKE 1 TABLET BY MOUTH TWICE A DAY AS NEEDED FOR FOCUS 60 tablet 0   [START ON 08/13/2021] amphetamine-dextroamphetamine (ADDERALL) 20 MG tablet TAKE 1 TABLET BY MOUTH TWICE A DAY AS NEEDED FOR FOCUS 60 tablet 0   [START ON 09/10/2021] amphetamine-dextroamphetamine (ADDERALL) 20 MG tablet TAKE 1 TABLET BY MOUTH TWICE A DAY AS NEEDED FOR FOCUS 60 tablet 0   cyclobenzaprine (FLEXERIL) 10 MG tablet Take 1/2 to 1 tablet po QHS prn back pain 30 tablet 0   diphenhydrAMINE (BENADRYL) 50 MG tablet Take 0.5 tablets (25 mg total) by mouth every 8 (eight) hours as needed for up to 5 days for itching. 30 tablet 0   fluconazole (DIFLUCAN) 150 MG tablet Take 1 tablet po once. May repeat dose in 3 days as needed for persistent symptoms. 3 tablet 1   hydrochlorothiazide (HYDRODIURIL) 25 MG tablet TAKE 1 TABLET BY MOUTH EVERY DAY 30 tablet 3   meclizine (ANTIVERT) 12.5 MG tablet Take 1-2 tablets (12.5-25 mg total) by mouth at bedtime as needed for dizziness or nausea. 60 tablet 0   meloxicam (MOBIC) 15 MG  tablet Take 1 tablet (15 mg total) by mouth daily. 90 tablet 1   traMADol (ULTRAM) 50 MG tablet Take 1 tablet (50 mg total) by mouth at bedtime as needed for moderate pain or severe pain. 30 tablet 0   valACYclovir (VALTREX) 1000 MG tablet Take 1 tablet (1,000 mg total) by mouth 2 (two) times daily. 60 tablet 3   No current facility-administered medications for this visit.    Patient confirms/reports the following allergies:  Allergies  Allergen Reactions   Penicillamine Rash   Bactrim [Sulfamethoxazole-Trimethoprim] Hives    Swelling in lips and leg, and hives   Penicillins     REACTION: Rash   Propoxyphene Other (See Comments)    Other reaction(s): Headache REACTION: Headache headache    Propoxyphene N-Acetaminophen     REACTION: Headache   Sulfa Antibiotics Hives    No orders of the defined types were placed in this encounter.   AUTHORIZATION INFORMATION Primary Insurance: 1D#: Group #:  Secondary Insurance: 1D#: Group #:  SCHEDULE INFORMATION: Date: 08/16/2021 Time: Location:[facility]

## 2021-08-14 ENCOUNTER — Encounter: Payer: BC Managed Care – PPO | Admitting: Nurse Practitioner

## 2021-08-16 ENCOUNTER — Encounter: Payer: Self-pay | Admitting: Gastroenterology

## 2021-08-16 ENCOUNTER — Ambulatory Visit
Admission: RE | Admit: 2021-08-16 | Discharge: 2021-08-16 | Disposition: A | Discharge status: Home / Self Care | Payer: BC Managed Care – PPO | Attending: Gastroenterology | Admitting: Gastroenterology

## 2021-08-16 ENCOUNTER — Ambulatory Visit: Payer: BC Managed Care – PPO | Admitting: Anesthesiology

## 2021-08-16 ENCOUNTER — Other Ambulatory Visit: Payer: Self-pay

## 2021-08-16 ENCOUNTER — Encounter
Admission: RE | Disposition: A | Discharge status: Home / Self Care | Payer: Self-pay | Source: Home / Self Care | Attending: Gastroenterology

## 2021-08-16 DIAGNOSIS — I10 Essential (primary) hypertension: Secondary | ICD-10-CM | POA: Diagnosis not present

## 2021-08-16 DIAGNOSIS — K644 Residual hemorrhoidal skin tags: Secondary | ICD-10-CM | POA: Insufficient documentation

## 2021-08-16 DIAGNOSIS — K219 Gastro-esophageal reflux disease without esophagitis: Secondary | ICD-10-CM | POA: Diagnosis not present

## 2021-08-16 DIAGNOSIS — G709 Myoneural disorder, unspecified: Secondary | ICD-10-CM | POA: Diagnosis not present

## 2021-08-16 DIAGNOSIS — Z1211 Encounter for screening for malignant neoplasm of colon: Secondary | ICD-10-CM | POA: Diagnosis not present

## 2021-08-16 HISTORY — PX: COLONOSCOPY WITH PROPOFOL: SHX5780

## 2021-08-16 SURGERY — COLONOSCOPY WITH PROPOFOL
Anesthesia: General

## 2021-08-16 MED ORDER — ACETAMINOPHEN 500 MG PO TABS
ORAL_TABLET | ORAL | Status: AC
  Filled 2021-08-16: qty 1

## 2021-08-16 MED ORDER — STERILE WATER FOR IRRIGATION IR SOLN
Status: DC | PRN
  Administered 2021-08-16 (×4): 60 mL

## 2021-08-16 MED ORDER — PHENYLEPHRINE HCL (PRESSORS) 10 MG/ML IV SOLN
INTRAVENOUS | Status: AC
  Filled 2021-08-16: qty 1

## 2021-08-16 MED ORDER — PROPOFOL 500 MG/50ML IV EMUL
INTRAVENOUS | Status: AC
  Filled 2021-08-16: qty 50

## 2021-08-16 MED ORDER — PROPOFOL 500 MG/50ML IV EMUL
INTRAVENOUS | Status: DC | PRN
  Administered 2021-08-16: 200 ug/kg/min via INTRAVENOUS

## 2021-08-16 MED ORDER — PROPOFOL 10 MG/ML IV BOLUS
INTRAVENOUS | Status: DC | PRN
  Administered 2021-08-16: 30 mg via INTRAVENOUS
  Administered 2021-08-16: 50 mg via INTRAVENOUS

## 2021-08-16 MED ORDER — SODIUM CHLORIDE 0.9 % IV SOLN: INTRAVENOUS | Status: DC

## 2021-08-16 NOTE — Anesthesia Preprocedure Evaluation (Signed)
Anesthesia Evaluation  Patient identified by MRN, date of birth, ID band Patient awake    Reviewed: Allergy & Precautions, NPO status , Patient's Chart, lab work & pertinent test results  History of Anesthesia Complications Negative for: history of anesthetic complications  Airway Mallampati: III  TM Distance: >3 FB Neck ROM: full    Dental  (+) Chipped   Pulmonary neg pulmonary ROS, neg shortness of breath,    Pulmonary exam normal        Cardiovascular Exercise Tolerance: Good hypertension, (-) angina(-) Past MI Normal cardiovascular exam     Neuro/Psych PSYCHIATRIC DISORDERS  Neuromuscular disease    GI/Hepatic negative GI ROS, Neg liver ROS, neg GERD  ,  Endo/Other  negative endocrine ROS  Renal/GU negative Renal ROS  negative genitourinary   Musculoskeletal   Abdominal   Peds  Hematology negative hematology ROS (+)   Anesthesia Other Findings Past Medical History: No date: Hypertension  Past Surgical History: 2017: BACK SURGERY 1999: GALLBLADDER SURGERY 2016: TOTAL VAGINAL HYSTERECTOMY     Comment:  bilateral salpingectomy at [facility] for abnormal uterine               bleeding  BMI    Body Mass Index: 33.99 kg/m      Reproductive/Obstetrics negative OB ROS                             Anesthesia Physical Anesthesia Plan  ASA: 3  Anesthesia Plan: General   Post-op Pain Management:    Induction: Intravenous  PONV Risk Score and Plan: Propofol infusion and TIVA  Airway Management Planned: Natural Airway and Nasal Cannula  Additional Equipment:   Intra-op Plan:   Post-operative Plan:   Informed Consent: I have reviewed the patients History and Physical, chart, labs and discussed the procedure including the risks, benefits and alternatives for the proposed anesthesia with the patient or authorized representative who has indicated his/her understanding and acceptance.      Dental Advisory Given  Plan Discussed with: Anesthesiologist, CRNA and Surgeon  Anesthesia Plan Comments: (Patient consented for risks of anesthesia including but not limited to:  - adverse reactions to medications - risk of airway placement if required - damage to eyes, teeth, lips or other oral mucosa - nerve damage due to positioning  - sore throat or hoarseness - Damage to heart, brain, nerves, lungs, other parts of body or loss of life  Patient voiced understanding.)        Anesthesia Quick Evaluation

## 2021-08-16 NOTE — Anesthesia Procedure Notes (Signed)
Date/Time: 08/16/2021 8:49 AM Performed by: Nelda Marseille, CRNA Pre-anesthesia Checklist: Patient identified, Emergency Drugs available, Suction available, Patient being monitored and Timeout performed Oxygen Delivery Method: Nasal cannula

## 2021-08-16 NOTE — Op Note (Signed)
[facility] Gastroenterology Patient Name: Audrey Ball Procedure Date: 08/16/2021 7:39 AM MRN: [id number] Account #: [id number] Date of Birth: 09/24/1970 Admit Type: Outpatient Age: 51 Room: [facility] ENDO ROOM 2 Gender: Female Note Status: Finalized Instrument Name: Park Meo [id number] Procedure:             Colonoscopy Indications:           Screening for colorectal malignant neoplasm, This is                         the patient's first colonoscopy Providers:             Lin Landsman MD, MD Referring MD:          Jonetta Osgood (Referring MD) Medicines:             General Anesthesia Complications:         No immediate complications. Estimated blood loss: None. Procedure:             Pre-Anesthesia Assessment:                        - Prior to the procedure, a History and Physical was                         performed, and patient medications and allergies were                         reviewed. The patient is competent. The risks and                         benefits of the procedure and the sedation options and                         risks were discussed with the patient. All questions                         were answered and informed consent was obtained.                         Patient identification and proposed procedure were                         verified by the physician, the nurse, the                         anesthesiologist, the anesthetist and the technician                         in the pre-procedure area in the procedure room in the                         endoscopy suite. Mental Status Examination: alert and                         oriented. Airway Examination: normal oropharyngeal                         airway and neck mobility. Respiratory Examination:  clear to auscultation. CV Examination: normal.                         Prophylactic Antibiotics: The patient does not require                         prophylactic  antibiotics. Prior Anticoagulants: The                         patient has taken no previous anticoagulant or                         antiplatelet agents. ASA Grade Assessment: III - A                         patient with severe systemic disease. After reviewing                         the risks and benefits, the patient was deemed in                         satisfactory condition to undergo the procedure. The                         anesthesia plan was to use general anesthesia.                         Immediately prior to administration of medications,                         the patient was re-assessed for adequacy to receive                         sedatives. The heart rate, respiratory rate, oxygen                         saturations, blood pressure, adequacy of pulmonary                         ventilation, and response to care were monitored                         throughout the procedure. The physical status of the                         patient was re-assessed after the procedure.                        After obtaining informed consent, the colonoscope was                         passed under direct vision. Throughout the procedure,                         the patient's blood pressure, pulse, and oxygen                         saturations were monitored continuously. The  Colonoscope was introduced through the anus and                         advanced to the the cecum, identified by appendiceal                         orifice and ileocecal valve. The colonoscopy was                         performed without difficulty. The patient tolerated                         the procedure well. The quality of the bowel                         preparation was evaluated using the BBPS [facility] Bowel                         Preparation Scale) with scores of: Right Colon = 3,                         Transverse Colon = 3 and Left Colon = 3 (entire mucosa                          seen well with no residual staining, small fragments                         of stool or opaque liquid). The total BBPS score                         equals 9. Findings:      The perianal and digital rectal examinations were normal. Pertinent       negatives include normal sphincter tone and no palpable rectal lesions.      Non-bleeding external hemorrhoids were found during retroflexion. The       hemorrhoids were large.      The exam was otherwise without abnormality. Impression:            - Non-bleeding external hemorrhoids.                        - The examination was otherwise normal.                        - No specimens collected. Recommendation:        - Discharge patient to home (with escort).                        - Resume previous diet today.                        - Continue present medications.                        - Repeat colonoscopy in 10 years for screening                         purposes. Procedure Code(s):     --- Professional ---  G6770, Colorectal cancer screening; colonoscopy on                         individual not meeting criteria for high risk Diagnosis Code(s):     --- Professional ---                        Z12.11, Encounter for screening for malignant neoplasm                         of colon                        K64.4, Residual hemorrhoidal skin tags CPT copyright 2019 American Medical Association. All rights reserved. The codes documented in this report are preliminary and upon coder review may  be revised to meet current compliance requirements. Dr. Ulyess Mort Lin Landsman MD, MD 08/16/2021 9:10:12 AM This report has been signed electronically. Number of Addenda: 0 Note Initiated On: 08/16/2021 7:39 AM Scope Withdrawal Time: 0 hours 12 minutes 20 seconds  Total Procedure Duration: 0 hours 15 minutes 0 seconds  Estimated Blood Loss:  Estimated blood loss: none.      [facility]

## 2021-08-16 NOTE — Transfer of Care (Signed)
Immediate Anesthesia Transfer of Care Note  Patient: Audrey Ball  Procedure(s) Performed: COLONOSCOPY WITH PROPOFOL  Patient Location: PACU  Anesthesia Type:General  Level of Consciousness: awake, alert  and oriented  Airway & Oxygen Therapy: Patient Spontanous Breathing and Patient connected to nasal cannula oxygen  Post-op Assessment: Report given to RN and Post -op Vital signs reviewed and stable  Post vital signs: Reviewed and stable  Last Vitals:  Vitals Value Taken Time  BP 127/88 08/16/21 0911  Temp 35.6 C 08/16/21 0910  Pulse 91 08/16/21 0912  Resp 17 08/16/21 0912  SpO2 100 % 08/16/21 0912  Vitals shown include unvalidated device data.  Last Pain:  Vitals:   08/16/21 0725  TempSrc: Temporal  PainSc: 0-No pain         Complications: No notable events documented.

## 2021-08-16 NOTE — H&P (Signed)
Cephas Darby, MD [address]  [place]  [place], [place] [address]  Main: [phone]  Fax: [phone] Pager: [phone]  Primary Care Physician:  Jonetta Osgood, NP Primary Gastroenterologist:  Dr. Cephas Darby  Pre-Procedure History & Physical: HPI:  Audrey Ball is a 51 y.o. female is here for an colonoscopy.   Past Medical History:  Diagnosis Date   Hypertension     Past Surgical History:  Procedure Laterality Date   BACK SURGERY  2017   [place]   TOTAL VAGINAL HYSTERECTOMY  2016   bilateral salpingectomy at [facility] for abnormal uterine bleeding    Prior to Admission medications   Medication Sig Start Date End Date Taking? Authorizing Provider  hydrochlorothiazide (HYDRODIURIL) 25 MG tablet TAKE 1 TABLET BY MOUTH EVERY DAY 05/01/21  Yes Abernathy, Alyssa, NP  amphetamine-dextroamphetamine (ADDERALL) 20 MG tablet TAKE 1 TABLET BY MOUTH TWICE A DAY AS NEEDED FOR FOCUS Patient not taking: Reported on 08/16/2021 07/16/21   Jonetta Osgood, NP  amphetamine-dextroamphetamine (ADDERALL) 20 MG tablet TAKE 1 TABLET BY MOUTH TWICE A DAY AS NEEDED FOR FOCUS Patient not taking: Reported on 08/16/2021 08/13/21   Jonetta Osgood, NP  amphetamine-dextroamphetamine (ADDERALL) 20 MG tablet TAKE 1 TABLET BY MOUTH TWICE A DAY AS NEEDED FOR FOCUS 09/10/21   Jonetta Osgood, NP  cyclobenzaprine (FLEXERIL) 10 MG tablet Take 1/2 to 1 tablet po QHS prn back pain Patient not taking: Reported on 08/16/2021 04/08/21   Jonetta Osgood, NP  diphenhydrAMINE (BENADRYL) 50 MG tablet Take 0.5 tablets (25 mg total) by mouth every 8 (eight) hours as needed for up to 5 days for itching. 01/05/21 07/17/21  Lannie Fields, PA-C  fluconazole (DIFLUCAN) 150 MG tablet Take 1 tablet po once. May repeat dose in 3 days as needed for persistent symptoms. Patient not taking: Reported on 08/16/2021 04/08/21   Jonetta Osgood, NP  meclizine (ANTIVERT) 12.5 MG tablet Take 1-2  tablets (12.5-25 mg total) by mouth at bedtime as needed for dizziness or nausea. Patient not taking: Reported on 08/16/2021 07/16/21   Jonetta Osgood, NP  meloxicam (MOBIC) 15 MG tablet Take 1 tablet (15 mg total) by mouth daily. 04/08/21   Jonetta Osgood, NP  traMADol (ULTRAM) 50 MG tablet Take 1 tablet (50 mg total) by mouth at bedtime as needed for moderate pain or severe pain. Patient not taking: Reported on 08/16/2021 04/22/21   Jonetta Osgood, NP  valACYclovir (VALTREX) 1000 MG tablet Take 1 tablet (1,000 mg total) by mouth 2 (two) times daily. Patient not taking: Reported on 08/16/2021 09/21/18   Kendell Bane, NP    Allergies as of 07/17/2021 - Review Complete 07/17/2021  Allergen Reaction Noted   Penicillamine Rash 06/15/2018   Bactrim [sulfamethoxazole-trimethoprim] Hives 01/07/2021   Penicillins     Propoxyphene Other (See Comments) 07/11/2014   Propoxyphene n-acetaminophen     Sulfa antibiotics Hives 06/20/2020    Family History  Problem Relation Age of Onset   Leukemia Mother    Hypertension Mother    Diabetes Mother    Lung cancer Father     Social History   Socioeconomic History   Marital status: Single    Spouse name: Not on file   Number of children: Not on file   Years of education: Not on file   Highest education level: Not on file  Occupational History   Not on file  Tobacco Use   Smoking status: Never   Smokeless tobacco: Never  Vaping  Use   Vaping Use: Never used  Substance and Sexual Activity   Alcohol use: No   Drug use: No   Sexual activity: Yes    Birth control/protection: Surgical  Other Topics Concern   Not on file  Social History Narrative   Not on file   Social Determinants of Health   Financial Resource Strain: Not on file  Food Insecurity: Not on file  Transportation Needs: Not on file  Physical Activity: Not on file  Stress: Not on file  Social Connections: Not on file  Intimate Partner Violence: Not on file     Review of Systems: See HPI, otherwise negative ROS  Physical Exam: BP (!) 162/85    Pulse 60    Temp (!) 96.2 F (35.7 C) (Temporal)    Resp 16    Ht 5\' 7"  (1.702 m)    Wt 98.4 kg    SpO2 100%    BMI 33.99 kg/m  General:   Alert,  pleasant and cooperative in NAD Head:  Normocephalic and atraumatic. Neck:  Supple; no masses or thyromegaly. Lungs:  Clear throughout to auscultation.    Heart:  Regular rate and rhythm. Abdomen:  Soft, nontender and nondistended. Normal bowel sounds, without guarding, and without rebound.   Neurologic:  Alert and  oriented x4;  grossly normal neurologically.  Impression/Plan: Audrey Ball is here for an colonoscopy to be performed for colon cancer screening  Risks, benefits, limitations, and alternatives regarding  colonoscopy have been reviewed with the patient.  Questions have been answered.  All parties agreeable.   Sherri Sear, MD  08/16/2021, 7:49 AM

## 2021-08-16 NOTE — OR Nursing (Signed)
PT REPORTS INCREASING H/A D/T ANESTHESIA. REQUESTS TYLENOL. RN F/U WITH DR Amie Critchley. RN ADM TYLENOL 500MG  PO

## 2021-08-16 NOTE — Anesthesia Postprocedure Evaluation (Signed)
Anesthesia Post Note  Patient: Audrey Ball  Procedure(s) Performed: COLONOSCOPY WITH PROPOFOL  Patient location during evaluation: Endoscopy Anesthesia Type: General Level of consciousness: awake and alert Pain management: pain level controlled Vital Signs Assessment: post-procedure vital signs reviewed and stable Respiratory status: spontaneous breathing, nonlabored ventilation, respiratory function stable and patient connected to nasal cannula oxygen Cardiovascular status: blood pressure returned to baseline and stable Postop Assessment: no apparent nausea or vomiting Anesthetic complications: no   No notable events documented.   Last Vitals:  Vitals:   08/16/21 0910 08/16/21 0920  BP:  138/85  Pulse:    Resp:    Temp: (!) 35.6 C   SpO2:      Last Pain:  Vitals:   08/16/21 0920  TempSrc:   PainSc: 0-No pain                 Audrey Ball

## 2021-08-19 ENCOUNTER — Encounter: Payer: Self-pay | Admitting: Gastroenterology

## 2021-08-26 ENCOUNTER — Other Ambulatory Visit: Payer: Self-pay | Admitting: Nurse Practitioner

## 2021-08-26 DIAGNOSIS — I10 Essential (primary) hypertension: Secondary | ICD-10-CM

## 2021-10-08 ENCOUNTER — Telehealth: Payer: Self-pay

## 2021-10-08 ENCOUNTER — Other Ambulatory Visit: Payer: Self-pay | Admitting: Nurse Practitioner

## 2021-10-08 DIAGNOSIS — G8929 Other chronic pain: Secondary | ICD-10-CM

## 2021-10-08 NOTE — Telephone Encounter (Signed)
Attempted to confirm 10/15/21 appointment, patient hung up after I stated where I was calling from. She did not answer when I immediately called back. Mailbox full, unable to leave vm-Toni ?

## 2021-10-15 ENCOUNTER — Encounter (INDEPENDENT_AMBULATORY_CARE_PROVIDER_SITE_OTHER): Payer: Self-pay

## 2021-10-15 ENCOUNTER — Encounter: Payer: Self-pay | Admitting: Nurse Practitioner

## 2021-10-15 ENCOUNTER — Ambulatory Visit (INDEPENDENT_AMBULATORY_CARE_PROVIDER_SITE_OTHER): Payer: BC Managed Care – PPO | Admitting: Nurse Practitioner

## 2021-10-15 VITALS — BP 136/81 | HR 60 | Temp 98.3°F | Resp 16 | Ht 67.0 in | Wt 233.0 lb

## 2021-10-15 DIAGNOSIS — E538 Deficiency of other specified B group vitamins: Secondary | ICD-10-CM

## 2021-10-15 DIAGNOSIS — E559 Vitamin D deficiency, unspecified: Secondary | ICD-10-CM

## 2021-10-15 DIAGNOSIS — R300 Dysuria: Secondary | ICD-10-CM

## 2021-10-15 DIAGNOSIS — I10 Essential (primary) hypertension: Secondary | ICD-10-CM

## 2021-10-15 DIAGNOSIS — Z0001 Encounter for general adult medical examination with abnormal findings: Secondary | ICD-10-CM

## 2021-10-15 DIAGNOSIS — D229 Melanocytic nevi, unspecified: Secondary | ICD-10-CM

## 2021-10-15 DIAGNOSIS — G8929 Other chronic pain: Secondary | ICD-10-CM

## 2021-10-15 DIAGNOSIS — R946 Abnormal results of thyroid function studies: Secondary | ICD-10-CM

## 2021-10-15 DIAGNOSIS — Z113 Encounter for screening for infections with a predominantly sexual mode of transmission: Secondary | ICD-10-CM

## 2021-10-15 DIAGNOSIS — Z79899 Other long term (current) drug therapy: Secondary | ICD-10-CM

## 2021-10-15 DIAGNOSIS — B3731 Acute candidiasis of vulva and vagina: Secondary | ICD-10-CM

## 2021-10-15 DIAGNOSIS — N390 Urinary tract infection, site not specified: Secondary | ICD-10-CM

## 2021-10-15 DIAGNOSIS — E782 Mixed hyperlipidemia: Secondary | ICD-10-CM

## 2021-10-15 DIAGNOSIS — M25562 Pain in left knee: Secondary | ICD-10-CM

## 2021-10-15 DIAGNOSIS — F988 Other specified behavioral and emotional disorders with onset usually occurring in childhood and adolescence: Secondary | ICD-10-CM

## 2021-10-15 LAB — POCT URINALYSIS DIPSTICK
Bilirubin, UA: NEGATIVE
Glucose, UA: NEGATIVE
Ketones, UA: NEGATIVE
Leukocytes, UA: NEGATIVE
Nitrite, UA: NEGATIVE
Protein, UA: POSITIVE — AB
Spec Grav, UA: 1.015 (ref 1.010–1.025)
Urobilinogen, UA: 0.2 E.U./dL
pH, UA: 7 (ref 5.0–8.0)

## 2021-10-15 LAB — POCT URINE DRUG SCREEN
POC Amphetamine UR: POSITIVE — AB
POC BENZODIAZEPINES UR: NOT DETECTED
POC Barbiturate UR: NOT DETECTED
POC Cocaine UR: NOT DETECTED
POC Ecstasy UR: NOT DETECTED
POC Marijuana UR: NOT DETECTED
POC Methadone UR: NOT DETECTED
POC Methamphetamine UR: NOT DETECTED
POC Opiate Ur: NOT DETECTED
POC Oxycodone UR: NOT DETECTED
POC PHENCYCLIDINE UR: NOT DETECTED
POC TRICYCLICS UR: NOT DETECTED

## 2021-10-15 MED ORDER — AMPHETAMINE-DEXTROAMPHETAMINE 20 MG PO TABS: ORAL_TABLET | ORAL | 0 refills | Status: DC

## 2021-10-15 MED ORDER — FLUCONAZOLE 150 MG PO TABS: 150.0000 mg | ORAL_TABLET | Freq: Once | ORAL | 2 refills | Status: AC

## 2021-10-15 MED ORDER — MELOXICAM 15 MG PO TABS: 15.0000 mg | ORAL_TABLET | Freq: Every day | ORAL | 1 refills | Status: DC

## 2021-10-15 NOTE — Progress Notes (Signed)
[facility] [place], [place] [address]  Internal MEDICINE  Office Visit Note  Patient Name: Audrey Ball  [id number]  [id number]  Date of Service: 10/15/2021  Chief Complaint  Patient presents with   Annual Exam    Yeast infection itching, burning, discharge    Hypertension   Medication Refill    HPI [name] presents for an annual well visit and physical exam. She is a well appearing 51 year old female with hypertension, GERD, lumbar radiculopathy, lumbar spinal stenosis, and ADHD.  Her screening mammogram was ordered and she has been reminded to schedule her mammogram.  She had her colonoscopy in February this year and it was normal.  Her Pap smear was done in 2019 that was normal and is not due till next year. Her main concern today is a yeast infection with vaginal itching, burning with urination and a thick white vaginal discharge. Her blood pressure and other vital signs are stable and within normal limits.  She is due for routine labs.    Current Medication: Outpatient Encounter Medications as of 10/15/2021  Medication Sig   fluconazole (DIFLUCAN) 150 MG tablet Take 1 tablet (150 mg total) by mouth once for 1 dose. May take an additional dose after 3 days if still symptomatic.   hydrochlorothiazide (HYDRODIURIL) 25 MG tablet TAKE 1 TABLET BY MOUTH EVERY DAY   traMADol (ULTRAM) 50 MG tablet Take 1 tablet (50 mg total) by mouth at bedtime as needed for moderate pain or severe pain.   [DISCONTINUED] amphetamine-dextroamphetamine (ADDERALL) 20 MG tablet TAKE 1 TABLET BY MOUTH TWICE A DAY AS NEEDED FOR FOCUS   [DISCONTINUED] meloxicam (MOBIC) 15 MG tablet Take 1 tablet (15 mg total) by mouth daily.   amphetamine-dextroamphetamine (ADDERALL) 20 MG tablet TAKE 1 TABLET BY MOUTH TWICE A DAY AS NEEDED FOR FOCUS   [START ON 11/12/2021] amphetamine-dextroamphetamine (ADDERALL) 20 MG tablet TAKE 1 TABLET BY MOUTH TWICE A DAY AS NEEDED FOR FOCUS   [START ON  12/10/2021] amphetamine-dextroamphetamine (ADDERALL) 20 MG tablet TAKE 1 TABLET BY MOUTH TWICE A DAY AS NEEDED FOR FOCUS   diphenhydrAMINE (BENADRYL) 50 MG tablet Take 0.5 tablets (25 mg total) by mouth every 8 (eight) hours as needed for up to 5 days for itching.   meloxicam (MOBIC) 15 MG tablet Take 1 tablet (15 mg total) by mouth daily.   [DISCONTINUED] amphetamine-dextroamphetamine (ADDERALL) 20 MG tablet TAKE 1 TABLET BY MOUTH TWICE A DAY AS NEEDED FOR FOCUS (Patient not taking: Reported on 08/16/2021)   [DISCONTINUED] amphetamine-dextroamphetamine (ADDERALL) 20 MG tablet TAKE 1 TABLET BY MOUTH TWICE A DAY AS NEEDED FOR FOCUS (Patient not taking: Reported on 08/16/2021)   [DISCONTINUED] cyclobenzaprine (FLEXERIL) 10 MG tablet Take 1/2 to 1 tablet po QHS prn back pain (Patient not taking: Reported on 08/16/2021)   [DISCONTINUED] fluconazole (DIFLUCAN) 150 MG tablet Take 1 tablet po once. May repeat dose in 3 days as needed for persistent symptoms. (Patient not taking: Reported on 08/16/2021)   [DISCONTINUED] meclizine (ANTIVERT) 12.5 MG tablet Take 1-2 tablets (12.5-25 mg total) by mouth at bedtime as needed for dizziness or nausea. (Patient not taking: Reported on 08/16/2021)   [DISCONTINUED] valACYclovir (VALTREX) 1000 MG tablet Take 1 tablet (1,000 mg total) by mouth 2 (two) times daily. (Patient not taking: Reported on 08/16/2021)   No facility-administered encounter medications on file as of 10/15/2021.    Surgical History: Past Surgical History:  Procedure Laterality Date   BACK SURGERY  2017   COLONOSCOPY WITH PROPOFOL  N/A 08/16/2021   Procedure: COLONOSCOPY WITH PROPOFOL;  Surgeon: Lin Landsman, MD;  Location: [facility] ENDOSCOPY;  Service: Gastroenterology;  Laterality: N/A;   [place]   TOTAL VAGINAL HYSTERECTOMY  2016   bilateral salpingectomy at [facility] for abnormal uterine bleeding    Medical History: Past Medical History:  Diagnosis Date   Hypertension     Family  History: Family History  Problem Relation Age of Onset   Leukemia Mother    Hypertension Mother    Diabetes Mother    Lung cancer Father     Social History   Socioeconomic History   Marital status: Single    Spouse name: Not on file   Number of children: Not on file   Years of education: Not on file   Highest education level: Not on file  Occupational History   Not on file  Tobacco Use   Smoking status: Never   Smokeless tobacco: Never  Vaping Use   Vaping Use: Never used  Substance and Sexual Activity   Alcohol use: No   Drug use: No   Sexual activity: Yes    Birth control/protection: Surgical  Other Topics Concern   Not on file  Social History Narrative   Not on file   Social Determinants of Health   Financial Resource Strain: Not on file  Food Insecurity: Not on file  Transportation Needs: Not on file  Physical Activity: Not on file  Stress: Not on file  Social Connections: Not on file  Intimate Partner Violence: Not on file      Review of Systems  Constitutional:  Negative for activity change, appetite change, chills, fatigue, fever and unexpected weight change.  HENT: Negative.  Negative for congestion, ear pain, rhinorrhea, sore throat and trouble swallowing.   Eyes: Negative.   Respiratory: Negative.  Negative for cough, chest tightness, shortness of breath and wheezing.   Cardiovascular: Negative.  Negative for chest pain.  Gastrointestinal: Negative.  Negative for abdominal pain, blood in stool, constipation, diarrhea, nausea and vomiting.  Endocrine: Negative.   Genitourinary: Negative.  Negative for difficulty urinating, dysuria, frequency, hematuria and urgency.  Musculoskeletal: Negative.  Negative for arthralgias, back pain, joint swelling, myalgias and neck pain.  Skin: Negative.  Negative for rash and wound.  Allergic/Immunologic: Negative.  Negative for immunocompromised state.  Neurological: Negative.  Negative for dizziness, seizures,  numbness and headaches.  Hematological: Negative.   Psychiatric/Behavioral: Negative.  Negative for behavioral problems, self-injury and suicidal ideas. The patient is not nervous/anxious.    Vital Signs: BP 136/81   Pulse 60   Temp 98.3 F (36.8 C)   Resp 16   Ht _0  (1.702 m)   Wt 233 lb (105.7 kg)   SpO2 99%   BMI 36.49 kg/m    Physical Exam Vitals reviewed.  Constitutional:      General: She is awake. She is not in acute distress.    Appearance: Normal appearance. She is well-developed and well-groomed. She is obese. She is not ill-appearing or diaphoretic.  HENT:     Head: Normocephalic and atraumatic.     Right Ear: Tympanic membrane, ear canal and external ear normal.     Left Ear: Tympanic membrane, ear canal and external ear normal.     Nose: Nose normal. No congestion or rhinorrhea.     Mouth/Throat:     Lips: Pink.     Mouth: Mucous membranes are moist.     Pharynx: Oropharynx is clear. Uvula midline.  No oropharyngeal exudate or posterior oropharyngeal erythema.  Eyes:     General: Lids are normal. Vision grossly intact. Gaze aligned appropriately. No scleral icterus.       Right eye: No discharge.        Left eye: No discharge.     Extraocular Movements: Extraocular movements intact.     Conjunctiva/sclera: Conjunctivae normal.     Pupils: Pupils are equal, round, and reactive to light.     Funduscopic exam:    Right eye: Red reflex present.        Left eye: Red reflex present. Neck:     Thyroid: No thyromegaly.     Vascular: No JVD.     Trachea: No tracheal deviation.  Cardiovascular:     Rate and Rhythm: Normal rate and regular rhythm.     Heart sounds: Normal heart sounds, S1 normal and S2 normal. No murmur heard.   No friction rub. No gallop.  Pulmonary:     Effort: Pulmonary effort is normal. No accessory muscle usage or respiratory distress.     Breath sounds: Normal breath sounds and air entry. No stridor. No wheezing or rales.  Chest:      Chest wall: No tenderness.  Abdominal:     General: Bowel sounds are normal. There is no distension.     Palpations: Abdomen is soft. There is no shifting dullness, fluid wave, mass or pulsatile mass.     Tenderness: There is no abdominal tenderness. There is no guarding or rebound.  Musculoskeletal:        General: No tenderness or deformity. Normal range of motion.     Cervical back: Normal range of motion and neck supple.     Right lower leg: No edema.     Left lower leg: No edema.  Lymphadenopathy:     Cervical: No cervical adenopathy.  Skin:    General: Skin is warm and dry.     Capillary Refill: Capillary refill takes less than 2 seconds.     Coloration: Skin is not pale.     Findings: No erythema or rash.  Neurological:     Mental Status: She is alert and oriented to person, place, and time.     Cranial Nerves: No cranial nerve deficit.     Motor: No abnormal muscle tone.     Coordination: Coordination normal.     Gait: Gait normal.     Deep Tendon Reflexes: Reflexes are normal and symmetric.  Psychiatric:        Mood and Affect: Mood normal.        Behavior: Behavior normal. Behavior is cooperative.        Thought Content: Thought content normal.        Judgment: Judgment normal.       Assessment/Plan: 1. Encounter for general adult medical examination with abnormal findings Age-appropriate preventive screenings and vaccinations discussed, annual physical exam completed. Routine labs for health maintenance ordered, see belo. PHM updated.  - CBC with Differential/Platelet - CMP14+EGFR  2. Essential hypertension BP is stable with current medications.  - CBC with Differential/Platelet - CMP14+EGFR  3. Abnormal thyroid function test Routine labs ordered.  - CBC with Differential/Platelet - CMP14+EGFR - TSH + free T4  4. Chronic pain of left knee Continue meloxicam as ordered, refills ordered.  - meloxicam (MOBIC) 15 MG tablet; Take 1 tablet (15 mg total) by  mouth daily.  Dispense: 90 tablet; Refill: 1  5. Vitamin D deficiency Routine labs ordered.  -  CBC with Differential/Platelet - CMP14+EGFR - Vitamin D (25 hydroxy)  6. Mixed hyperlipidemia Routine labs ordered.  - CBC with Differential/Platelet - CMP14+EGFR - Lipid Profile  7. B12 deficiency Routine labs ordered.  - CBC with Differential/Platelet - CMP14+EGFR - B12 and Folate Panel  8. Atypical mole Possible atypical mole on her back, referred to dermatology.  - Ambulatory referral to Dermatology  9. Dysuria Urinalysis done, negative for UTI.  - POCT Urinalysis Dipstick  10. Routine screening for STI (sexually transmitted infection) Routine labs ordered.  - HepB+HepC+HIV Panel - RPR  11. Encounter for long-term (current) use of high-risk medication UDS positive for amphetamine which is consistent with current prescriptions.  - POCT Urine Drug Screen  12. Attention deficit disorder of adult Continue current medication as prescribed, current dose remains effective. Refills x3 months ordered, follow up in 3 months for additional refills.  - amphetamine-dextroamphetamine (ADDERALL) 20 MG tablet; TAKE 1 TABLET BY MOUTH TWICE A DAY AS NEEDED FOR FOCUS  Dispense: 60 tablet; Refill: 0 - amphetamine-dextroamphetamine (ADDERALL) 20 MG tablet; TAKE 1 TABLET BY MOUTH TWICE A DAY AS NEEDED FOR FOCUS  Dispense: 60 tablet; Refill: 0 - amphetamine-dextroamphetamine (ADDERALL) 20 MG tablet; TAKE 1 TABLET BY MOUTH TWICE A DAY AS NEEDED FOR FOCUS  Dispense: 60 tablet; Refill: 0  13. Recurrent candidiasis of vagina Fluconazole prescribed to treat yeast infection - fluconazole (DIFLUCAN) 150 MG tablet; Take 1 tablet (150 mg total) by mouth once for 1 dose. May take an additional dose after 3 days if still symptomatic.  Dispense: 3 tablet; Refill: 2      General Counseling: Kameelah verbalizes understanding of the findings of todays visit and agrees with plan of treatment. I have discussed  any further diagnostic evaluation that may be needed or ordered today. We also reviewed her medications today. she has been encouraged to call the office with any questions or concerns that should arise related to todays visit.    Orders Placed This Encounter  Procedures   CBC with Differential/Platelet   CMP14+EGFR   B12 and Folate Panel   Vitamin D (25 hydroxy)   TSH + free T4   Lipid Profile   HepB+HepC+HIV Panel   RPR   POCT Urine Drug Screen   POCT Urinalysis Dipstick    Meds ordered this encounter  Medications   meloxicam (MOBIC) 15 MG tablet    Sig: Take 1 tablet (15 mg total) by mouth daily.    Dispense:  90 tablet    Refill:  1   fluconazole (DIFLUCAN) 150 MG tablet    Sig: Take 1 tablet (150 mg total) by mouth once for 1 dose. May take an additional dose after 3 days if still symptomatic.    Dispense:  3 tablet    Refill:  2   amphetamine-dextroamphetamine (ADDERALL) 20 MG tablet    Sig: TAKE 1 TABLET BY MOUTH TWICE A DAY AS NEEDED FOR FOCUS    Dispense:  60 tablet    Refill:  0    april fill   amphetamine-dextroamphetamine (ADDERALL) 20 MG tablet    Sig: TAKE 1 TABLET BY MOUTH TWICE A DAY AS NEEDED FOR FOCUS    Dispense:  60 tablet    Refill:  0    may fill   amphetamine-dextroamphetamine (ADDERALL) 20 MG tablet    Sig: TAKE 1 TABLET BY MOUTH TWICE A DAY AS NEEDED FOR FOCUS    Dispense:  60 tablet    Refill:  0  june fill    Return in about 3 months (around 01/14/2022) for F/U, ADHD med check, [place] PCP.   Total time spent:30 Minutes Time spent includes review of chart, medications, test results, and follow up plan with the patient.   [place] Controlled Substance Database was reviewed by me.  This patient was seen by Jonetta Osgood, FNP-C in collaboration with Dr. Clayborn Bigness as a part of collaborative care agreement.  Kristine Chahal R. Valetta Fuller, MSN, FNP-C Internal medicine

## 2021-10-21 LAB — CULTURE, URINE COMPREHENSIVE

## 2021-11-17 ENCOUNTER — Encounter: Payer: Self-pay | Admitting: Nurse Practitioner

## 2022-01-14 ENCOUNTER — Ambulatory Visit: Payer: BC Managed Care – PPO | Admitting: Nurse Practitioner

## 2022-03-04 ENCOUNTER — Ambulatory Visit: Payer: BC Managed Care – PPO | Admitting: Nurse Practitioner

## 2022-03-04 ENCOUNTER — Encounter: Payer: Self-pay | Admitting: Nurse Practitioner

## 2022-03-04 VITALS — BP 136/82 | HR 62 | Temp 97.8°F | Resp 16 | Ht 67.0 in | Wt 231.4 lb

## 2022-03-04 DIAGNOSIS — I10 Essential (primary) hypertension: Secondary | ICD-10-CM | POA: Diagnosis not present

## 2022-03-04 DIAGNOSIS — G8929 Other chronic pain: Secondary | ICD-10-CM

## 2022-03-04 DIAGNOSIS — B3731 Acute candidiasis of vulva and vagina: Secondary | ICD-10-CM | POA: Diagnosis not present

## 2022-03-04 DIAGNOSIS — M25562 Pain in left knee: Secondary | ICD-10-CM | POA: Diagnosis not present

## 2022-03-04 DIAGNOSIS — F988 Other specified behavioral and emotional disorders with onset usually occurring in childhood and adolescence: Secondary | ICD-10-CM

## 2022-03-04 DIAGNOSIS — Z1231 Encounter for screening mammogram for malignant neoplasm of breast: Secondary | ICD-10-CM

## 2022-03-04 MED ORDER — HYDROCHLOROTHIAZIDE 25 MG PO TABS: 25.0000 mg | ORAL_TABLET | Freq: Every day | ORAL | 1 refills | Status: DC

## 2022-03-04 MED ORDER — AMPHETAMINE-DEXTROAMPHETAMINE 20 MG PO TABS: ORAL_TABLET | ORAL | 0 refills | Status: DC

## 2022-03-04 MED ORDER — FLUCONAZOLE 150 MG PO TABS: 150.0000 mg | ORAL_TABLET | ORAL | 1 refills | Status: DC

## 2022-03-04 MED ORDER — MELOXICAM 15 MG PO TABS: 15.0000 mg | ORAL_TABLET | Freq: Every day | ORAL | 1 refills | Status: DC

## 2022-03-04 NOTE — Progress Notes (Signed)
[facility] [place], [place] [address]  Internal MEDICINE  Office Visit Note  Patient Name: Audrey Ball  [id number]  [id number]  Date of Service: 03/04/2022  Chief Complaint  Patient presents with   Follow-up    Follow up med refill.---Pt needs a mammogram order again    HPI Adhya presents for a follow up visit for medication refills, hypertension, arthritis, ADHD and mammogram.  Hypertension -- controlled with current medication Right knee chronic arthritis -- manageable, takes meloxicam Recurrent yeast infection -- likes to keep fluconazole on hand ADHD -- adderall 20 mg is effective, denies any palpitations or other adverse side effects, HR and BP are normal Mammo due -- need new order sent    Current Medication: Outpatient Encounter Medications as of 03/04/2022  Medication Sig   fluconazole (DIFLUCAN) 150 MG tablet Take 1 tablet (150 mg total) by mouth once a week. For prevention of recurrent candidal infection.   [DISCONTINUED] amphetamine-dextroamphetamine (ADDERALL) 20 MG tablet TAKE 1 TABLET BY MOUTH TWICE A DAY AS NEEDED FOR FOCUS   [DISCONTINUED] amphetamine-dextroamphetamine (ADDERALL) 20 MG tablet TAKE 1 TABLET BY MOUTH TWICE A DAY AS NEEDED FOR FOCUS   [DISCONTINUED] amphetamine-dextroamphetamine (ADDERALL) 20 MG tablet TAKE 1 TABLET BY MOUTH TWICE A DAY AS NEEDED FOR FOCUS   [DISCONTINUED] hydrochlorothiazide (HYDRODIURIL) 25 MG tablet TAKE 1 TABLET BY MOUTH EVERY DAY   [DISCONTINUED] meloxicam (MOBIC) 15 MG tablet Take 1 tablet (15 mg total) by mouth daily.   [DISCONTINUED] traMADol (ULTRAM) 50 MG tablet Take 1 tablet (50 mg total) by mouth at bedtime as needed for moderate pain or severe pain.   amphetamine-dextroamphetamine (ADDERALL) 20 MG tablet TAKE 1 TABLET BY MOUTH TWICE A DAY AS NEEDED FOR FOCUS   [START ON 04/01/2022] amphetamine-dextroamphetamine (ADDERALL) 20 MG tablet TAKE 1 TABLET BY MOUTH TWICE A DAY AS NEEDED FOR FOCUS    [START ON 04/29/2022] amphetamine-dextroamphetamine (ADDERALL) 20 MG tablet TAKE 1 TABLET BY MOUTH TWICE A DAY AS NEEDED FOR FOCUS   hydrochlorothiazide (HYDRODIURIL) 25 MG tablet Take 1 tablet (25 mg total) by mouth daily.   meloxicam (MOBIC) 15 MG tablet Take 1 tablet (15 mg total) by mouth daily.   [DISCONTINUED] diphenhydrAMINE (BENADRYL) 50 MG tablet Take 0.5 tablets (25 mg total) by mouth every 8 (eight) hours as needed for up to 5 days for itching.   No facility-administered encounter medications on file as of 03/04/2022.    Surgical History: Past Surgical History:  Procedure Laterality Date   BACK SURGERY  2017   COLONOSCOPY WITH PROPOFOL N/A 08/16/2021   Procedure: COLONOSCOPY WITH PROPOFOL;  Surgeon: Lin Landsman, MD;  Location: [facility] ENDOSCOPY;  Service: Gastroenterology;  Laterality: N/A;   [place]   TOTAL VAGINAL HYSTERECTOMY  2016   bilateral salpingectomy at [facility] for abnormal uterine bleeding    Medical History: Past Medical History:  Diagnosis Date   Hypertension     Family History: Family History  Problem Relation Age of Onset   Leukemia Mother    Hypertension Mother    Diabetes Mother    Lung cancer Father     Social History   Socioeconomic History   Marital status: Single    Spouse name: Not on file   Number of children: Not on file   Years of education: Not on file   Highest education level: Not on file  Occupational History   Not on file  Tobacco Use   Smoking status: Never  Smokeless tobacco: Never  Vaping Use   Vaping Use: Never used  Substance and Sexual Activity   Alcohol use: No   Drug use: No   Sexual activity: Yes    Birth control/protection: Surgical  Other Topics Concern   Not on file  Social History Narrative   Not on file   Social Determinants of Health   Financial Resource Strain: Not on file  Food Insecurity: Not on file  Transportation Needs: Not on file  Physical Activity: Not on file  Stress:  Not on file  Social Connections: Not on file  Intimate Partner Violence: Not on file      Review of Systems  Constitutional:  Negative for chills, fatigue and unexpected weight change.  HENT:  Negative for congestion, rhinorrhea, sneezing and sore throat.   Eyes:  Negative for redness.  Respiratory:  Negative for cough, chest tightness and shortness of breath.   Cardiovascular: Negative.  Negative for chest pain and palpitations.  Gastrointestinal:  Negative for abdominal pain, constipation, diarrhea, nausea and vomiting.  Genitourinary:  Negative for dysuria, flank pain (mild) and frequency.       Vaginal itching  Musculoskeletal:  Positive for arthralgias and back pain. Negative for joint swelling and neck pain.  Neurological: Negative.  Negative for tremors and numbness.  Hematological:  Negative for adenopathy. Does not bruise/bleed easily.  Psychiatric/Behavioral:  Positive for decreased concentration (takes adderall). Negative for behavioral problems (Depression), self-injury, sleep disturbance and suicidal ideas. The patient is not nervous/anxious.     Vital Signs: BP 136/82   Pulse 62   Temp 97.8 F (36.6 C)   Resp 16   Ht '5\' 7"'$  (1.702 m)   Wt 231 lb 6.4 oz (105 kg)   SpO2 98%   BMI 36.24 kg/m    Physical Exam Vitals reviewed.  Constitutional:      Appearance: Normal appearance.  HENT:     Head: Normocephalic and atraumatic.  Eyes:     Pupils: Pupils are equal, round, and reactive to light.  Cardiovascular:     Rate and Rhythm: Normal rate and regular rhythm.  Pulmonary:     Effort: Pulmonary effort is normal. No respiratory distress.  Neurological:     Mental Status: She is alert and oriented to person, place, and time.  Psychiatric:        Mood and Affect: Mood normal.        Behavior: Behavior normal.       Assessment/Plan: 1. Essential hypertension, benign Stable, continue HCTZ as prescribed, refills ordered - hydrochlorothiazide (HYDRODIURIL)  25 MG tablet; Take 1 tablet (25 mg total) by mouth daily.  Dispense: 90 tablet; Refill: 1  2. Chronic pain of left knee Manageable, continue meloxicam as prescribed, refills ordered - meloxicam (MOBIC) 15 MG tablet; Take 1 tablet (15 mg total) by mouth daily.  Dispense: 90 tablet; Refill: 1  3. Recurrent candidiasis of vagina Takes fluconazole once weekly for prevention, refills ordered, continue as prescribed.  - fluconazole (DIFLUCAN) 150 MG tablet; Take 1 tablet (150 mg total) by mouth once a week. For prevention of recurrent candidal infection.  Dispense: 24 tablet; Refill: 1  4. Attention deficit disorder of adult Refills x3 months ordered, follow up in 3 months for additional refills.  - amphetamine-dextroamphetamine (ADDERALL) 20 MG tablet; TAKE 1 TABLET BY MOUTH TWICE A DAY AS NEEDED FOR FOCUS  Dispense: 60 tablet; Refill: 0 - amphetamine-dextroamphetamine (ADDERALL) 20 MG tablet; TAKE 1 TABLET BY MOUTH TWICE A DAY AS NEEDED  FOR FOCUS  Dispense: 60 tablet; Refill: 0 - amphetamine-dextroamphetamine (ADDERALL) 20 MG tablet; TAKE 1 TABLET BY MOUTH TWICE A DAY AS NEEDED FOR FOCUS  Dispense: 60 tablet; Refill: 0  5. Encounter for screening mammogram for malignant neoplasm of breast Routine mammogram ordered - MM 3D SCREEN BREAST BILATERAL; Future   General Counseling: Clover verbalizes understanding of the findings of todays visit and agrees with plan of treatment. I have discussed any further diagnostic evaluation that may be needed or ordered today. We also reviewed her medications today. she has been encouraged to call the office with any questions or concerns that should arise related to todays visit.    Orders Placed This Encounter  Procedures   MM 3D SCREEN BREAST BILATERAL    Meds ordered this encounter  Medications   amphetamine-dextroamphetamine (ADDERALL) 20 MG tablet    Sig: TAKE 1 TABLET BY MOUTH TWICE A DAY AS NEEDED FOR FOCUS    Dispense:  60 tablet    Refill:  0     Fill for sept.   amphetamine-dextroamphetamine (ADDERALL) 20 MG tablet    Sig: TAKE 1 TABLET BY MOUTH TWICE A DAY AS NEEDED FOR FOCUS    Dispense:  60 tablet    Refill:  0    Fill for oct   amphetamine-dextroamphetamine (ADDERALL) 20 MG tablet    Sig: TAKE 1 TABLET BY MOUTH TWICE A DAY AS NEEDED FOR FOCUS    Dispense:  60 tablet    Refill:  0    Fill for november   hydrochlorothiazide (HYDRODIURIL) 25 MG tablet    Sig: Take 1 tablet (25 mg total) by mouth daily.    Dispense:  90 tablet    Refill:  1   meloxicam (MOBIC) 15 MG tablet    Sig: Take 1 tablet (15 mg total) by mouth daily.    Dispense:  90 tablet    Refill:  1   fluconazole (DIFLUCAN) 150 MG tablet    Sig: Take 1 tablet (150 mg total) by mouth once a week. For prevention of recurrent candidal infection.    Dispense:  24 tablet    Refill:  1    Return in about 3 months (around 06/03/2022) for F/U, ADHD med check, [place] PCP.   Total time spent:30 Minutes Time spent includes review of chart, medications, test results, and follow up plan with the patient.   [place] Controlled Substance Database was reviewed by me.  This patient was seen by Jonetta Osgood, FNP-C in collaboration with Dr. Clayborn Bigness as a part of collaborative care agreement.   Mclain Freer R. Valetta Fuller, MSN, FNP-C Internal medicine

## 2022-04-14 ENCOUNTER — Ambulatory Visit: Payer: BC Managed Care – PPO | Admitting: Dermatology

## 2022-04-26 ENCOUNTER — Encounter: Payer: Self-pay | Admitting: Nurse Practitioner

## 2022-06-03 ENCOUNTER — Ambulatory Visit: Payer: BC Managed Care – PPO | Admitting: Nurse Practitioner

## 2022-06-03 ENCOUNTER — Encounter: Payer: Self-pay | Admitting: Nurse Practitioner

## 2022-06-03 VITALS — BP 138/80 | HR 64 | Temp 97.1°F | Resp 16 | Ht 67.0 in | Wt 237.0 lb

## 2022-06-03 DIAGNOSIS — R0602 Shortness of breath: Secondary | ICD-10-CM

## 2022-06-03 DIAGNOSIS — E782 Mixed hyperlipidemia: Secondary | ICD-10-CM | POA: Diagnosis not present

## 2022-06-03 DIAGNOSIS — F988 Other specified behavioral and emotional disorders with onset usually occurring in childhood and adolescence: Secondary | ICD-10-CM | POA: Diagnosis not present

## 2022-06-03 DIAGNOSIS — I10 Essential (primary) hypertension: Secondary | ICD-10-CM

## 2022-06-03 MED ORDER — AMPHETAMINE-DEXTROAMPHETAMINE 20 MG PO TABS: ORAL_TABLET | ORAL | 0 refills | Status: DC

## 2022-06-03 NOTE — Progress Notes (Signed)
[facility] [place], [place] [address]  Internal MEDICINE  Office Visit Note  Patient Name: Audrey Ball  [id number]  [id number]  Date of Service: 06/03/2022  Chief Complaint  Patient presents with   ADHD    Med refills     HPI Audrey Ball presents for a follow up visit for  SOB -- noticed x1 year ago, orthopnea-- sleeps on 3-4 pillows some lower extremity edema in the left leg and foot . Concern for CHF vs deconditioning ADHD -- denies any side effects, current dose is effective. BP and HR are stable.  High blood pressure -- rechecked and improved    Current Medication: Outpatient Encounter Medications as of 06/03/2022  Medication Sig   fluconazole (DIFLUCAN) 150 MG tablet Take 1 tablet (150 mg total) by mouth once a week. For prevention of recurrent candidal infection.   hydrochlorothiazide (HYDRODIURIL) 25 MG tablet Take 1 tablet (25 mg total) by mouth daily.   meloxicam (MOBIC) 15 MG tablet Take 1 tablet (15 mg total) by mouth daily.   [DISCONTINUED] amphetamine-dextroamphetamine (ADDERALL) 20 MG tablet TAKE 1 TABLET BY MOUTH TWICE A DAY AS NEEDED FOR FOCUS   [DISCONTINUED] amphetamine-dextroamphetamine (ADDERALL) 20 MG tablet TAKE 1 TABLET BY MOUTH TWICE A DAY AS NEEDED FOR FOCUS   [DISCONTINUED] amphetamine-dextroamphetamine (ADDERALL) 20 MG tablet TAKE 1 TABLET BY MOUTH TWICE A DAY AS NEEDED FOR FOCUS   [START ON 07/29/2022] amphetamine-dextroamphetamine (ADDERALL) 20 MG tablet TAKE 1 TABLET BY MOUTH TWICE A DAY AS NEEDED FOR FOCUS   [START ON 07/01/2022] amphetamine-dextroamphetamine (ADDERALL) 20 MG tablet TAKE 1 TABLET BY MOUTH TWICE A DAY AS NEEDED FOR FOCUS   amphetamine-dextroamphetamine (ADDERALL) 20 MG tablet TAKE 1 TABLET BY MOUTH TWICE A DAY AS NEEDED FOR FOCUS   No facility-administered encounter medications on file as of 06/03/2022.    Surgical History: Past Surgical History:  Procedure Laterality Date   BACK SURGERY  2017    COLONOSCOPY WITH PROPOFOL N/A 08/16/2021   Procedure: COLONOSCOPY WITH PROPOFOL;  Surgeon: Lin Landsman, MD;  Location: [facility] ENDOSCOPY;  Service: Gastroenterology;  Laterality: N/A;   [place]   TOTAL VAGINAL HYSTERECTOMY  2016   bilateral salpingectomy at [facility] for abnormal uterine bleeding    Medical History: Past Medical History:  Diagnosis Date   Hypertension     Family History: Family History  Problem Relation Age of Onset   Leukemia Mother    Hypertension Mother    Diabetes Mother    Lung cancer Father     Social History   Socioeconomic History   Marital status: Single    Spouse name: Not on file   Number of children: Not on file   Years of education: Not on file   Highest education level: Not on file  Occupational History   Not on file  Tobacco Use   Smoking status: Never   Smokeless tobacco: Never  Vaping Use   Vaping Use: Never used  Substance and Sexual Activity   Alcohol use: No   Drug use: No   Sexual activity: Yes    Birth control/protection: Surgical  Other Topics Concern   Not on file  Social History Narrative   Not on file   Social Determinants of Health   Financial Resource Strain: Not on file  Food Insecurity: Not on file  Transportation Needs: Not on file  Physical Activity: Not on file  Stress: Not on file  Social Connections: Not on file  Intimate  Partner Violence: Not on file      Review of Systems  Respiratory:  Positive for shortness of breath. Negative for apnea, cough, choking, chest tightness and wheezing.        Orthopnea -- sleeps on 3-4 pillows, trouble breathing when she lays flat    Vital Signs: BP 138/80 Comment: 157/88  Pulse 64   Temp (!) 97.1 F (36.2 C)   Resp 16   Ht _0  (1.702 m)   Wt 237 lb (107.5 kg)   SpO2 96%   BMI 37.12 kg/m    Physical Exam Vitals reviewed.  Constitutional:      General: She is not in acute distress.    Appearance: Normal appearance. She is obese. She  is not ill-appearing.  HENT:     Head: Normocephalic and atraumatic.     Mouth/Throat:     Mouth: Mucous membranes are moist.  Eyes:     Pupils: Pupils are equal, round, and reactive to light.  Cardiovascular:     Rate and Rhythm: Normal rate and regular rhythm.     Heart sounds: Normal heart sounds.  Pulmonary:     Effort: Pulmonary effort is normal. No respiratory distress.     Breath sounds: Normal breath sounds. No wheezing or rales.  Musculoskeletal:     Left lower leg: Edema present.  Skin:    General: Skin is warm and dry.     Capillary Refill: Capillary refill takes less than 2 seconds.  Neurological:     Mental Status: She is alert and oriented to person, place, and time.  Psychiatric:        Mood and Affect: Mood normal.        Behavior: Behavior normal.        Assessment/Plan: 1. SOB (shortness of breath) on exertion Cardiac workup -- additional labs ordered and an echocardiogram.  - CBC with Differential/Platelet - CMP14+EGFR - Lipid Profile - Pro b natriuretic peptide (BNP)[facility]) - C-reactive protein - Sed Rate (ESR) - ECHOCARDIOGRAM COMPLETE; Future  2. Essential hypertension Stable, continue medication as prescribed   3. Mixed hyperlipidemia Routine lab ordered - Lipid Profile  4. Attention deficit disorder of adult Refills x3 months ordered, follow up in 3 months for additional refills  - amphetamine-dextroamphetamine (ADDERALL) 20 MG tablet; TAKE 1 TABLET BY MOUTH TWICE A DAY AS NEEDED FOR FOCUS  Dispense: 60 tablet; Refill: 0 - amphetamine-dextroamphetamine (ADDERALL) 20 MG tablet; TAKE 1 TABLET BY MOUTH TWICE A DAY AS NEEDED FOR FOCUS  Dispense: 60 tablet; Refill: 0 - amphetamine-dextroamphetamine (ADDERALL) 20 MG tablet; TAKE 1 TABLET BY MOUTH TWICE A DAY AS NEEDED FOR FOCUS  Dispense: 60 tablet; Refill: 0   General Counseling: Audrey Ball verbalizes understanding of the findings of todays visit and agrees with plan of  treatment. I have discussed any further diagnostic evaluation that may be needed or ordered today. We also reviewed her medications today. she has been encouraged to call the office with any questions or concerns that should arise related to todays visit.    Orders Placed This Encounter  Procedures   CBC with Differential/Platelet   CMP14+EGFR   Lipid Profile   Pro b natriuretic peptide (BNP)[facility])   C-reactive protein   Sed Rate (ESR)   ECHOCARDIOGRAM COMPLETE    Meds ordered this encounter  Medications   amphetamine-dextroamphetamine (ADDERALL) 20 MG tablet    Sig: TAKE 1 TABLET BY MOUTH TWICE A DAY AS NEEDED FOR FOCUS  Dispense:  60 tablet    Refill:  0    Fill for february   amphetamine-dextroamphetamine (ADDERALL) 20 MG tablet    Sig: TAKE 1 TABLET BY MOUTH TWICE A DAY AS NEEDED FOR FOCUS    Dispense:  60 tablet    Refill:  0    Fill for january   amphetamine-dextroamphetamine (ADDERALL) 20 MG tablet    Sig: TAKE 1 TABLET BY MOUTH TWICE A DAY AS NEEDED FOR FOCUS    Dispense:  60 tablet    Refill:  0    Fill for december    Return in about 4 weeks (around 07/01/2022) for F/U, Labs and echocardiogram, Skylinn Vialpando PCP.   Total time spent:30 Minutes Time spent includes review of chart, medications, test results, and follow up plan with the patient.   [place] Controlled Substance Database was reviewed by me.  This patient was seen by Jonetta Osgood, FNP-C in collaboration with Dr. Clayborn Bigness as a part of collaborative care agreement.   Wisdom Rickey R. Valetta Fuller, MSN, FNP-C Internal medicine

## 2022-06-10 ENCOUNTER — Ambulatory Visit: Payer: BC Managed Care – PPO | Attending: Nurse Practitioner

## 2022-06-10 DIAGNOSIS — R0602 Shortness of breath: Secondary | ICD-10-CM

## 2022-06-11 LAB — ECHOCARDIOGRAM COMPLETE
AR max vel: 2.32 cm2
AV Area VTI: 2.46 cm2
AV Area mean vel: 2.2 cm2
AV Mean grad: 5 mmHg
AV Peak grad: 8.8 mmHg
Ao pk vel: 1.48 m/s
Area-P 1/2: 3.45 cm2
S' Lateral: 2.6 cm

## 2022-07-02 NOTE — Progress Notes (Signed)
Will discuss results at upcoming appt.

## 2022-07-07 DIAGNOSIS — E782 Mixed hyperlipidemia: Secondary | ICD-10-CM | POA: Diagnosis not present

## 2022-07-07 DIAGNOSIS — R0602 Shortness of breath: Secondary | ICD-10-CM | POA: Diagnosis not present

## 2022-07-08 ENCOUNTER — Encounter: Payer: Self-pay | Admitting: Nurse Practitioner

## 2022-07-08 ENCOUNTER — Ambulatory Visit: Payer: BC Managed Care – PPO | Admitting: Nurse Practitioner

## 2022-07-08 VITALS — BP 138/80 | HR 74 | Temp 97.9°F | Resp 16 | Ht 67.0 in | Wt 236.0 lb

## 2022-07-08 DIAGNOSIS — I071 Rheumatic tricuspid insufficiency: Secondary | ICD-10-CM | POA: Diagnosis not present

## 2022-07-08 DIAGNOSIS — E782 Mixed hyperlipidemia: Secondary | ICD-10-CM

## 2022-07-08 DIAGNOSIS — I10 Essential (primary) hypertension: Secondary | ICD-10-CM

## 2022-07-08 LAB — CBC WITH DIFFERENTIAL/PLATELET
Basophils Absolute: 0 10*3/uL (ref 0.0–0.2)
Basos: 1 %
EOS (ABSOLUTE): 0.2 10*3/uL (ref 0.0–0.4)
Eos: 4 %
Hematocrit: 42.6 % (ref 34.0–46.6)
Hemoglobin: 14.2 g/dL (ref 11.1–15.9)
Immature Grans (Abs): 0 10*3/uL (ref 0.0–0.1)
Immature Granulocytes: 0 %
Lymphocytes Absolute: 2 10*3/uL (ref 0.7–3.1)
Lymphs: 36 %
MCH: 27.9 pg (ref 26.6–33.0)
MCHC: 33.3 g/dL (ref 31.5–35.7)
MCV: 84 fL (ref 79–97)
Monocytes Absolute: 0.4 10*3/uL (ref 0.1–0.9)
Monocytes: 8 %
Neutrophils Absolute: 2.8 10*3/uL (ref 1.4–7.0)
Neutrophils: 51 %
Platelets: 271 10*3/uL (ref 150–450)
RBC: 5.09 x10E6/uL (ref 3.77–5.28)
RDW: 13.4 % (ref 11.7–15.4)
WBC: 5.5 10*3/uL (ref 3.4–10.8)

## 2022-07-08 LAB — CMP14+EGFR
ALT: 22 IU/L (ref 0–32)
AST: 21 IU/L (ref 0–40)
Albumin/Globulin Ratio: 1.6 (ref 1.2–2.2)
Albumin: 4.6 g/dL (ref 3.8–4.9)
Alkaline Phosphatase: 98 IU/L (ref 44–121)
BUN/Creatinine Ratio: 24 — ABNORMAL HIGH (ref 9–23)
BUN: 13 mg/dL (ref 6–24)
Bilirubin Total: 0.4 mg/dL (ref 0.0–1.2)
CO2: 27 mmol/L (ref 20–29)
Calcium: 10.1 mg/dL (ref 8.7–10.2)
Chloride: 99 mmol/L (ref 96–106)
Creatinine, Ser: 0.55 mg/dL — ABNORMAL LOW (ref 0.57–1.00)
Globulin, Total: 2.8 g/dL (ref 1.5–4.5)
Glucose: 100 mg/dL — ABNORMAL HIGH (ref 70–99)
Potassium: 3.6 mmol/L (ref 3.5–5.2)
Sodium: 143 mmol/L (ref 134–144)
Total Protein: 7.4 g/dL (ref 6.0–8.5)
eGFR: 110 mL/min/{1.73_m2} (ref >59)

## 2022-07-08 LAB — SEDIMENTATION RATE: Sed Rate: 21 mm/hr (ref 0–40)

## 2022-07-08 LAB — PRO B NATRIURETIC PEPTIDE: NT-Pro BNP: 51 pg/mL (ref 0–249)

## 2022-07-08 LAB — C-REACTIVE PROTEIN: CRP: 7 mg/L (ref 0–10)

## 2022-07-08 LAB — LIPID PANEL
Chol/HDL Ratio: 4 ratio (ref 0.0–4.4)
Cholesterol, Total: 188 mg/dL (ref 100–199)
HDL: 47 mg/dL (ref >39)
LDL Chol Calc (NIH): 115 mg/dL — ABNORMAL HIGH (ref 0–99)
Triglycerides: 146 mg/dL (ref 0–149)
VLDL Cholesterol Cal: 26 mg/dL (ref 5–40)

## 2022-07-08 NOTE — Progress Notes (Signed)
[facility] [place], [place] [address]  Internal MEDICINE  Office Visit Note  Patient Name: Audrey Ball  [id number]  [id number]  Date of Service: 07/08/2022  Chief Complaint  Patient presents with   Follow-up   Hypertension   Quality Metric Gaps    TDAP and Shingles    HPI Audrey Ball presents for a follow-up visit for review lab results and echocardiogram results. Elevated LDL 115 -- slightly elevated, all other cholesterol numbers are normal.  Glucose 100 on labs -- slightly elevated only 1 time.  Discussed echocardiogram results -- mild TR and mildly elevated right systolic arterial pressure.     Current Medication: Outpatient Encounter Medications as of 07/08/2022  Medication Sig   [START ON 07/29/2022] amphetamine-dextroamphetamine (ADDERALL) 20 MG tablet TAKE 1 TABLET BY MOUTH TWICE A DAY AS NEEDED FOR FOCUS   amphetamine-dextroamphetamine (ADDERALL) 20 MG tablet TAKE 1 TABLET BY MOUTH TWICE A DAY AS NEEDED FOR FOCUS   amphetamine-dextroamphetamine (ADDERALL) 20 MG tablet TAKE 1 TABLET BY MOUTH TWICE A DAY AS NEEDED FOR FOCUS   fluconazole (DIFLUCAN) 150 MG tablet Take 1 tablet (150 mg total) by mouth once a week. For prevention of recurrent candidal infection.   hydrochlorothiazide (HYDRODIURIL) 25 MG tablet Take 1 tablet (25 mg total) by mouth daily.   meloxicam (MOBIC) 15 MG tablet Take 1 tablet (15 mg total) by mouth daily.   No facility-administered encounter medications on file as of 07/08/2022.    Surgical History: Past Surgical History:  Procedure Laterality Date   BACK SURGERY  2017   COLONOSCOPY WITH PROPOFOL N/A 08/16/2021   Procedure: COLONOSCOPY WITH PROPOFOL;  Surgeon: Lin Landsman, MD;  Location: [facility] ENDOSCOPY;  Service: Gastroenterology;  Laterality: N/A;   [place]   TOTAL VAGINAL HYSTERECTOMY  2016   bilateral salpingectomy at [facility] for abnormal uterine bleeding    Medical History: Past Medical  History:  Diagnosis Date   Hypertension     Family History: Family History  Problem Relation Age of Onset   Leukemia Mother    Hypertension Mother    Diabetes Mother    Lung cancer Father     Social History   Socioeconomic History   Marital status: Single    Spouse name: Not on file   Number of children: Not on file   Years of education: Not on file   Highest education level: Not on file  Occupational History   Not on file  Tobacco Use   Smoking status: Never   Smokeless tobacco: Never  Vaping Use   Vaping Use: Never used  Substance and Sexual Activity   Alcohol use: No   Drug use: No   Sexual activity: Yes    Birth control/protection: Surgical  Other Topics Concern   Not on file  Social History Narrative   Not on file   Social Determinants of Health   Financial Resource Strain: Not on file  Food Insecurity: Not on file  Transportation Needs: Not on file  Physical Activity: Not on file  Stress: Not on file  Social Connections: Not on file  Intimate Partner Violence: Not on file      Review of Systems  Constitutional:  Negative for chills, fatigue and unexpected weight change.  HENT:  Negative for congestion, rhinorrhea, sneezing and sore throat.   Eyes:  Negative for redness.  Respiratory:  Positive for shortness of breath. Negative for apnea, cough, choking, chest tightness and wheezing.  Orthopnea -- sleeps on 3-4 pillows, trouble breathing when she lays flat  Cardiovascular: Negative.  Negative for chest pain and palpitations.  Gastrointestinal:  Negative for abdominal pain, constipation, diarrhea, nausea and vomiting.  Genitourinary:  Negative for dysuria, flank pain and frequency.  Musculoskeletal:  Positive for arthralgias and back pain. Negative for joint swelling and neck pain.  Neurological: Negative.  Negative for tremors and numbness.  Hematological:  Negative for adenopathy. Does not bruise/bleed easily.  Psychiatric/Behavioral:   Positive for decreased concentration (takes adderall). Negative for behavioral problems (Depression), self-injury, sleep disturbance and suicidal ideas. The patient is not nervous/anxious.     Vital Signs: BP (!) 152/88   Pulse 74   Temp 97.9 F (36.6 C)   Resp 16   Ht '5\' 7"'$  (1.702 m)   Wt 236 lb (107 kg)   SpO2 97%   BMI 36.96 kg/m    Physical Exam Vitals reviewed.  Constitutional:      General: She is not in acute distress.    Appearance: Normal appearance. She is obese. She is not ill-appearing.  HENT:     Head: Normocephalic and atraumatic.     Mouth/Throat:     Mouth: Mucous membranes are moist.  Eyes:     Pupils: Pupils are equal, round, and reactive to light.  Cardiovascular:     Rate and Rhythm: Normal rate and regular rhythm.     Heart sounds: Normal heart sounds.  Pulmonary:     Effort: Pulmonary effort is normal. No respiratory distress.     Breath sounds: Normal breath sounds. No wheezing or rales.  Musculoskeletal:     Left lower leg: Edema present.  Skin:    General: Skin is warm and dry.     Capillary Refill: Capillary refill takes less than 2 seconds.  Neurological:     Mental Status: She is alert and oriented to person, place, and time.  Psychiatric:        Mood and Affect: Mood normal.        Behavior: Behavior normal.        Assessment/Plan: 1. Mild tricuspid regurgitation Stable, will repeat echocardiogram every 1-2 years  2. Essential hypertension Stable, continue medications as prescribed.   3. Mixed hyperlipidemia Elevated LDL is minimal, discussed increasing physical activity and diet changes by increasing lean protein will help too. Decrease fried and high saturated fat foods.   General Counseling: Audrey Ball verbalizes understanding of the findings of todays visit and agrees with plan of treatment. I have discussed any further diagnostic evaluation that may be needed or ordered today. We also reviewed her medications today. she has  been encouraged to call the office with any questions or concerns that should arise related to todays visit.    No orders of the defined types were placed in this encounter.   No orders of the defined types were placed in this encounter.   Return for previously scheduled, F/U, Audrey Ball PCP in april.   Total time spent:30 Minutes Time spent includes review of chart, medications, test results, and follow up plan with the patient.   [place] Controlled Substance Database was reviewed by me.  This patient was seen by Jonetta Osgood, FNP-C in collaboration with Dr. Clayborn Bigness as a part of collaborative care agreement.   Osby Sweetin R. Valetta Fuller, MSN, FNP-C Internal medicine

## 2022-07-11 ENCOUNTER — Telehealth: Payer: Self-pay

## 2022-07-11 ENCOUNTER — Other Ambulatory Visit: Payer: Self-pay

## 2022-07-11 MED ORDER — AZITHROMYCIN 250 MG PO TABS: ORAL_TABLET | ORAL | 0 refills | Status: DC

## 2022-07-11 NOTE — Telephone Encounter (Signed)
Pt called that congested and coughing ,covid test is negative as per alyssa send Zpak

## 2022-09-04 ENCOUNTER — Other Ambulatory Visit: Payer: Self-pay | Admitting: Nurse Practitioner

## 2022-09-04 DIAGNOSIS — I10 Essential (primary) hypertension: Secondary | ICD-10-CM

## 2022-09-28 DIAGNOSIS — M1712 Unilateral primary osteoarthritis, left knee: Secondary | ICD-10-CM | POA: Diagnosis not present

## 2022-10-13 DIAGNOSIS — M1712 Unilateral primary osteoarthritis, left knee: Secondary | ICD-10-CM | POA: Diagnosis not present

## 2022-10-21 ENCOUNTER — Encounter: Payer: BC Managed Care – PPO | Admitting: Nurse Practitioner

## 2022-10-22 ENCOUNTER — Ambulatory Visit (INDEPENDENT_AMBULATORY_CARE_PROVIDER_SITE_OTHER): Payer: BC Managed Care – PPO | Admitting: Nurse Practitioner

## 2022-10-22 ENCOUNTER — Encounter: Payer: Self-pay | Admitting: Nurse Practitioner

## 2022-10-22 VITALS — BP 122/79 | HR 77 | Temp 98.7°F | Resp 16 | Ht 67.0 in | Wt 228.2 lb

## 2022-10-22 DIAGNOSIS — G8929 Other chronic pain: Secondary | ICD-10-CM

## 2022-10-22 DIAGNOSIS — R300 Dysuria: Secondary | ICD-10-CM

## 2022-10-22 DIAGNOSIS — Z0001 Encounter for general adult medical examination with abnormal findings: Secondary | ICD-10-CM | POA: Diagnosis not present

## 2022-10-22 DIAGNOSIS — M25562 Pain in left knee: Secondary | ICD-10-CM | POA: Diagnosis not present

## 2022-10-22 DIAGNOSIS — F988 Other specified behavioral and emotional disorders with onset usually occurring in childhood and adolescence: Secondary | ICD-10-CM

## 2022-10-22 DIAGNOSIS — I10 Essential (primary) hypertension: Secondary | ICD-10-CM | POA: Diagnosis not present

## 2022-10-22 DIAGNOSIS — L282 Other prurigo: Secondary | ICD-10-CM | POA: Diagnosis not present

## 2022-10-22 MED ORDER — AMPHETAMINE-DEXTROAMPHETAMINE 20 MG PO TABS: ORAL_TABLET | ORAL | 0 refills | Status: DC

## 2022-10-22 MED ORDER — HYDROCHLOROTHIAZIDE 25 MG PO TABS: 25.0000 mg | ORAL_TABLET | Freq: Every day | ORAL | 1 refills | Status: DC

## 2022-10-22 MED ORDER — BETAMETHASONE VALERATE 0.1 % EX OINT: 1.0000 | TOPICAL_OINTMENT | Freq: Every day | CUTANEOUS | 5 refills | Status: AC | PRN

## 2022-10-22 MED ORDER — MELOXICAM 15 MG PO TABS: 15.0000 mg | ORAL_TABLET | Freq: Every day | ORAL | 1 refills | Status: DC

## 2022-10-22 NOTE — Progress Notes (Signed)
[facility] [address] [place], [place] [address]  Internal MEDICINE  Office Visit Note  Patient Name: Audrey Ball  [id number]  [id number]  Date of Service: 10/22/2022  Chief Complaint  Patient presents with   Hypertension   Annual Exam    HPI Audrey Ball presents for an annual well visit and physical exam.  Well-appearing 52 y.o. female with ADHD, lower extremity edema, hypertension and arthritis.  Routine CRC screening: due in 2033 Routine mammogram: due now, ordered in September. Pap smear: due in September 2024 Labs: done in January this year.  New or worsening pain: none  LDL 115, slightly elevated Glucose 100 Kidney and liver function are normal Vital signs are normal   Current Medication: Outpatient Encounter Medications as of 10/22/2022  Medication Sig   [DISCONTINUED] amphetamine-dextroamphetamine (ADDERALL) 20 MG tablet TAKE 1 TABLET BY MOUTH TWICE A DAY AS NEEDED FOR FOCUS   [DISCONTINUED] amphetamine-dextroamphetamine (ADDERALL) 20 MG tablet TAKE 1 TABLET BY MOUTH TWICE A DAY AS NEEDED FOR FOCUS   [DISCONTINUED] amphetamine-dextroamphetamine (ADDERALL) 20 MG tablet TAKE 1 TABLET BY MOUTH TWICE A DAY AS NEEDED FOR FOCUS   [DISCONTINUED] azithromycin (ZITHROMAX) 250 MG tablet Use as directed  for 5 days   [DISCONTINUED] fluconazole (DIFLUCAN) 150 MG tablet Take 1 tablet (150 mg total) by mouth once a week. For prevention of recurrent candidal infection.   [DISCONTINUED] hydrochlorothiazide (HYDRODIURIL) 25 MG tablet TAKE 1 TABLET (25 MG TOTAL) BY MOUTH DAILY.   [DISCONTINUED] meloxicam (MOBIC) 15 MG tablet Take 1 tablet (15 mg total) by mouth daily.   [START ON 12/17/2022] amphetamine-dextroamphetamine (ADDERALL) 20 MG tablet TAKE 1 TABLET BY MOUTH TWICE A DAY AS NEEDED FOR FOCUS   [START ON 11/19/2022] amphetamine-dextroamphetamine (ADDERALL) 20 MG tablet TAKE 1 TABLET BY MOUTH TWICE A DAY AS NEEDED FOR FOCUS   amphetamine-dextroamphetamine (ADDERALL)  20 MG tablet TAKE 1 TABLET BY MOUTH TWICE A DAY AS NEEDED FOR FOCUS   hydrochlorothiazide (HYDRODIURIL) 25 MG tablet Take 1 tablet (25 mg total) by mouth daily.   meloxicam (MOBIC) 15 MG tablet Take 1 tablet (15 mg total) by mouth daily.   No facility-administered encounter medications on file as of 10/22/2022.    Surgical History: Past Surgical History:  Procedure Laterality Date   BACK SURGERY  2017   COLONOSCOPY WITH PROPOFOL N/A 08/16/2021   Procedure: COLONOSCOPY WITH PROPOFOL;  Surgeon: Toney Reil, MD;  Location: [facility] ENDOSCOPY;  Service: Gastroenterology;  Laterality: N/A;   GALLBLADDER SURGERY  1999   TOTAL VAGINAL HYSTERECTOMY  2016   bilateral salpingectomy at [facility] for abnormal uterine bleeding    Medical History: Past Medical History:  Diagnosis Date   Hypertension     Family History: Family History  Problem Relation Age of Onset   Leukemia Mother    Hypertension Mother    Diabetes Mother    Lung cancer Father     Social History   Socioeconomic History   Marital status: Single    Spouse name: Not on file   Number of children: Not on file   Years of education: Not on file   Highest education level: Not on file  Occupational History   Not on file  Tobacco Use   Smoking status: Never   Smokeless tobacco: Never  Vaping Use   Vaping Use: Never used  Substance and Sexual Activity   Alcohol use: No   Drug use: No   Sexual activity: Yes    Birth control/protection: Surgical  Other Topics  Concern   Not on file  Social History Narrative   Not on file   Social Determinants of Health   Financial Resource Strain: Not on file  Food Insecurity: Not on file  Transportation Needs: Not on file  Physical Activity: Not on file  Stress: Not on file  Social Connections: Not on file  Intimate Partner Violence: Not on file      Review of Systems  Constitutional:  Negative for activity change, appetite change, chills, fatigue, fever and unexpected weight  change.  HENT: Negative.  Negative for congestion, ear pain, rhinorrhea, sore throat and trouble swallowing.   Eyes: Negative.   Respiratory: Negative.  Negative for cough, chest tightness, shortness of breath and wheezing.   Cardiovascular: Negative.  Negative for chest pain.  Gastrointestinal: Negative.  Negative for abdominal pain, blood in stool, constipation, diarrhea, nausea and vomiting.  Endocrine: Negative.   Genitourinary: Negative.  Negative for difficulty urinating, dysuria, frequency, hematuria and urgency.  Musculoskeletal: Negative.  Negative for arthralgias, back pain, joint swelling, myalgias and neck pain.  Skin: Negative.  Negative for rash and wound.  Allergic/Immunologic: Negative.  Negative for immunocompromised state.  Neurological: Negative.  Negative for dizziness, seizures, numbness and headaches.  Hematological: Negative.   Psychiatric/Behavioral: Negative.  Negative for behavioral problems, self-injury and suicidal ideas. The patient is not nervous/anxious.     Vital Signs: BP 122/79   Pulse 77   Temp 98.7 F (37.1 C)   Resp 16   Ht 5\' 7"  (1.702 m)   Wt 228 lb 3.2 oz (103.5 kg)   SpO2 97%   BMI 35.74 kg/m    Physical Exam Vitals reviewed.  Constitutional:      General: She is awake. She is not in acute distress.    Appearance: Normal appearance. She is well-developed and well-groomed. She is obese. She is not ill-appearing or diaphoretic.  HENT:     Head: Normocephalic and atraumatic.     Right Ear: Tympanic membrane, ear canal and external ear normal.     Left Ear: Tympanic membrane, ear canal and external ear normal.     Nose: Nose normal. No congestion or rhinorrhea.     Mouth/Throat:     Lips: Pink.     Mouth: Mucous membranes are moist.     Pharynx: Oropharynx is clear. Uvula midline. No oropharyngeal exudate or posterior oropharyngeal erythema.  Eyes:     General: Lids are normal. Vision grossly intact. Gaze aligned appropriately. No  scleral icterus.       Right eye: No discharge.        Left eye: No discharge.     Extraocular Movements: Extraocular movements intact.     Conjunctiva/sclera: Conjunctivae normal.     Pupils: Pupils are equal, round, and reactive to light.     Funduscopic exam:    Right eye: Red reflex present.        Left eye: Red reflex present. Neck:     Thyroid: No thyromegaly.     Vascular: No JVD.     Trachea: No tracheal deviation.  Cardiovascular:     Rate and Rhythm: Normal rate and regular rhythm.     Heart sounds: Normal heart sounds, S1 normal and S2 normal. No murmur heard.    No friction rub. No gallop.  Pulmonary:     Effort: Pulmonary effort is normal. No accessory muscle usage or respiratory distress.     Breath sounds: Normal breath sounds and air entry. No stridor. No wheezing  or rales.  Chest:     Chest wall: No tenderness.  Abdominal:     General: Bowel sounds are normal. There is no distension.     Palpations: Abdomen is soft. There is no shifting dullness, fluid wave, mass or pulsatile mass.     Tenderness: There is no abdominal tenderness. There is no guarding or rebound.  Musculoskeletal:        General: No tenderness or deformity. Normal range of motion.     Cervical back: Normal range of motion and neck supple.     Right lower leg: No edema.     Left lower leg: No edema.  Lymphadenopathy:     Cervical: No cervical adenopathy.  Skin:    General: Skin is warm and dry.     Capillary Refill: Capillary refill takes less than 2 seconds.     Coloration: Skin is not pale.     Findings: No erythema or rash.  Neurological:     Mental Status: She is alert and oriented to person, place, and time.     Cranial Nerves: No cranial nerve deficit.     Motor: No abnormal muscle tone.     Coordination: Coordination normal.     Gait: Gait normal.     Deep Tendon Reflexes: Reflexes are normal and symmetric.  Psychiatric:        Mood and Affect: Mood normal.        Behavior:  Behavior normal. Behavior is cooperative.        Thought Content: Thought content normal.        Judgment: Judgment normal.        Assessment/Plan: 1. Encounter for routine adult health examination with abnormal findings Age-appropriate preventive screenings and vaccinations discussed, annual physical exam completed. Routine labs for health maintenance results discussed previously. PHM updated.   2. Essential hypertension Continue HCTZ as prescribed.  - hydrochlorothiazide (HYDRODIURIL) 25 MG tablet; Take 1 tablet (25 mg total) by mouth daily.  Dispense: 90 tablet; Refill: 1  3. Chronic pain of left knee Continue meloxicam as prescribed - meloxicam (MOBIC) 15 MG tablet; Take 1 tablet (15 mg total) by mouth daily.  Dispense: 90 tablet; Refill: 1  4. Pruritic rash Topical prescription sent - betamethasone valerate ointment (VALISONE) 0.1 %; Apply 1 Application topically daily as needed (rash). To affected area until resolved.  Dispense: 45 g; Refill: 5  5. Dysuria Routine urinalysis done - Urinalysis, Routine w reflex microscopic  6. Attention deficit disorder of adult Refills x3 months ordered, follow up in 3 months for additional refills  - amphetamine-dextroamphetamine (ADDERALL) 20 MG tablet; TAKE 1 TABLET BY MOUTH TWICE A DAY AS NEEDED FOR FOCUS  Dispense: 60 tablet; Refill: 0 - amphetamine-dextroamphetamine (ADDERALL) 20 MG tablet; TAKE 1 TABLET BY MOUTH TWICE A DAY AS NEEDED FOR FOCUS  Dispense: 60 tablet; Refill: 0 - amphetamine-dextroamphetamine (ADDERALL) 20 MG tablet; TAKE 1 TABLET BY MOUTH TWICE A DAY AS NEEDED FOR FOCUS  Dispense: 60 tablet; Refill: 0     General Counseling: Audrey Ball verbalizes understanding of the findings of todays visit and agrees with plan of treatment. I have discussed any further diagnostic evaluation that may be needed or ordered today. We also reviewed her medications today. she has been encouraged to call the office with any questions or  concerns that should arise related to todays visit.    Orders Placed This Encounter  Procedures   Urinalysis, Routine w reflex microscopic    Meds ordered this encounter  Medications   amphetamine-dextroamphetamine (ADDERALL) 20 MG tablet    Sig: TAKE 1 TABLET BY MOUTH TWICE A DAY AS NEEDED FOR FOCUS    Dispense:  60 tablet    Refill:  0    Fill for June 26   amphetamine-dextroamphetamine (ADDERALL) 20 MG tablet    Sig: TAKE 1 TABLET BY MOUTH TWICE A DAY AS NEEDED FOR FOCUS    Dispense:  60 tablet    Refill:  0    Fill for may 29   amphetamine-dextroamphetamine (ADDERALL) 20 MG tablet    Sig: TAKE 1 TABLET BY MOUTH TWICE A DAY AS NEEDED FOR FOCUS    Dispense:  60 tablet    Refill:  0    Fill for may 1   hydrochlorothiazide (HYDRODIURIL) 25 MG tablet    Sig: Take 1 tablet (25 mg total) by mouth daily.    Dispense:  90 tablet    Refill:  1   meloxicam (MOBIC) 15 MG tablet    Sig: Take 1 tablet (15 mg total) by mouth daily.    Dispense:  90 tablet    Refill:  1    Return in about 3 months (around 01/19/2023) for F/U, ADHD med check, Emi Lymon PCP.   Total time spent:30 Minutes Time spent includes review of chart, medications, test results, and follow up plan with the patient.   [place] Controlled Substance Database was reviewed by me.  This patient was seen by Sallyanne Kuster, FNP-C in collaboration with Dr. Beverely Risen as a part of collaborative care agreement.  Tanna Loeffler R. Tedd Sias, MSN, FNP-C Internal medicine

## 2022-10-23 LAB — URINALYSIS, ROUTINE W REFLEX MICROSCOPIC
Bilirubin, UA: NEGATIVE
Glucose, UA: NEGATIVE
Ketones, UA: NEGATIVE
Leukocytes,UA: NEGATIVE
Nitrite, UA: NEGATIVE
Protein,UA: NEGATIVE
RBC, UA: NEGATIVE
Specific Gravity, UA: 1.016 (ref 1.005–1.030)
Urobilinogen, Ur: 0.2 mg/dL (ref 0.2–1.0)
pH, UA: 6 (ref 5.0–7.5)

## 2023-01-19 ENCOUNTER — Other Ambulatory Visit: Payer: Self-pay | Admitting: Nurse Practitioner

## 2023-01-19 ENCOUNTER — Ambulatory Visit (INDEPENDENT_AMBULATORY_CARE_PROVIDER_SITE_OTHER): Payer: BC Managed Care – PPO | Admitting: Nurse Practitioner

## 2023-01-19 ENCOUNTER — Encounter: Payer: Self-pay | Admitting: Nurse Practitioner

## 2023-01-19 VITALS — BP 135/82 | HR 78 | Temp 97.7°F | Resp 16 | Ht 67.0 in | Wt 232.0 lb

## 2023-01-19 DIAGNOSIS — R635 Abnormal weight gain: Secondary | ICD-10-CM

## 2023-01-19 DIAGNOSIS — F988 Other specified behavioral and emotional disorders with onset usually occurring in childhood and adolescence: Secondary | ICD-10-CM

## 2023-01-19 DIAGNOSIS — E6609 Other obesity due to excess calories: Secondary | ICD-10-CM

## 2023-01-19 DIAGNOSIS — M25562 Pain in left knee: Secondary | ICD-10-CM | POA: Diagnosis not present

## 2023-01-19 DIAGNOSIS — G8929 Other chronic pain: Secondary | ICD-10-CM | POA: Diagnosis not present

## 2023-01-19 DIAGNOSIS — Z6836 Body mass index (BMI) 36.0-36.9, adult: Secondary | ICD-10-CM

## 2023-01-19 MED ORDER — AMPHETAMINE-DEXTROAMPHETAMINE 20 MG PO TABS
ORAL_TABLET | ORAL | 0 refills | Status: DC
Start: 2023-03-16 — End: 2023-04-13

## 2023-01-19 MED ORDER — AMPHETAMINE-DEXTROAMPHETAMINE 20 MG PO TABS
ORAL_TABLET | ORAL | 0 refills | Status: DC
Start: 2023-02-16 — End: 2023-04-13

## 2023-01-19 MED ORDER — TIRZEPATIDE-WEIGHT MANAGEMENT 2.5 MG/0.5ML ~~LOC~~ SOAJ
2.5000 mg | SUBCUTANEOUS | 0 refills | Status: DC
Start: 2023-01-19 — End: 2023-02-17

## 2023-01-19 MED ORDER — AMPHETAMINE-DEXTROAMPHETAMINE 20 MG PO TABS
ORAL_TABLET | ORAL | 0 refills | Status: DC
Start: 2023-01-19 — End: 2023-04-13

## 2023-01-19 NOTE — Progress Notes (Signed)
[facility] [address] [place], [place] [address]  Internal MEDICINE  Office Visit Note  Patient Name: Audrey Ball  [id number]  [id number]  Date of Service: 01/19/2023  Chief Complaint  Patient presents with   Follow-up   Weight Loss    HPI Chika presents for a follow-up visit for weight loss management and ADHD and refills.  Weight loss management -- takes adderall which has not helped her lose weight. Has tried phentermine, bontril, and topamax. Has tried basic exercise and diet changes.  ADHD -- current dose is effective. HR and BP are normal. Denies any palpitations or other side effects of the medication.  Chronic left knee pain -- will improve with weight loss.     Current Medication: Outpatient Encounter Medications as of 01/19/2023  Medication Sig   betamethasone valerate ointment (VALISONE) 0.1 % Apply 1 Application topically daily as needed (rash). To affected area until resolved.   hydrochlorothiazide (HYDRODIURIL) 25 MG tablet Take 1 tablet (25 mg total) by mouth daily.   meloxicam (MOBIC) 15 MG tablet Take 1 tablet (15 mg total) by mouth daily.   tirzepatide (ZEPBOUND) 2.5 MG/0.5ML Pen Inject 2.5 mg into the skin once a week.   [DISCONTINUED] amphetamine-dextroamphetamine (ADDERALL) 20 MG tablet TAKE 1 TABLET BY MOUTH TWICE A DAY AS NEEDED FOR FOCUS   [DISCONTINUED] amphetamine-dextroamphetamine (ADDERALL) 20 MG tablet TAKE 1 TABLET BY MOUTH TWICE A DAY AS NEEDED FOR FOCUS   [DISCONTINUED] amphetamine-dextroamphetamine (ADDERALL) 20 MG tablet TAKE 1 TABLET BY MOUTH TWICE A DAY AS NEEDED FOR FOCUS   amphetamine-dextroamphetamine (ADDERALL) 20 MG tablet TAKE 1 TABLET BY MOUTH TWICE A DAY AS NEEDED FOR FOCUS   [START ON 02/16/2023] amphetamine-dextroamphetamine (ADDERALL) 20 MG tablet TAKE 1 TABLET BY MOUTH TWICE A DAY AS NEEDED FOR FOCUS   [START ON 03/16/2023] amphetamine-dextroamphetamine (ADDERALL) 20 MG tablet TAKE 1 TABLET BY MOUTH TWICE A DAY AS  NEEDED FOR FOCUS   No facility-administered encounter medications on file as of 01/19/2023.    Surgical History: Past Surgical History:  Procedure Laterality Date   BACK SURGERY  2017   COLONOSCOPY WITH PROPOFOL N/A 08/16/2021   Procedure: COLONOSCOPY WITH PROPOFOL;  Surgeon: Toney Reil, MD;  Location: [facility] ENDOSCOPY;  Service: Gastroenterology;  Laterality: N/A;   GALLBLADDER SURGERY  1999   TOTAL VAGINAL HYSTERECTOMY  2016   bilateral salpingectomy at [facility] for abnormal uterine bleeding    Medical History: Past Medical History:  Diagnosis Date   Hypertension     Family History: Family History  Problem Relation Age of Onset   Leukemia Mother    Hypertension Mother    Diabetes Mother    Lung cancer Father     Social History   Socioeconomic History   Marital status: Single    Spouse name: Not on file   Number of children: Not on file   Years of education: Not on file   Highest education level: Not on file  Occupational History   Not on file  Tobacco Use   Smoking status: Never   Smokeless tobacco: Never  Vaping Use   Vaping status: Never Used  Substance and Sexual Activity   Alcohol use: No   Drug use: No   Sexual activity: Yes    Birth control/protection: Surgical  Other Topics Concern   Not on file  Social History Narrative   Not on file   Social Determinants of Health   Financial Resource Strain: Not on file  Food Insecurity: Not  on file  Transportation Needs: Not on file  Physical Activity: Not on file  Stress: Not on file  Social Connections: Not on file  Intimate Partner Violence: Not on file      Review of Systems  Constitutional:  Positive for fatigue and unexpected weight change. Negative for chills.  HENT:  Negative for congestion, rhinorrhea, sneezing and sore throat.   Eyes:  Negative for redness.  Respiratory: Negative.  Negative for cough, chest tightness, shortness of breath and wheezing.   Cardiovascular: Negative.   Negative for chest pain and palpitations.  Gastrointestinal:  Negative for abdominal pain, constipation, diarrhea, nausea and vomiting.  Genitourinary:  Negative for dysuria, flank pain (mild) and frequency.       Vaginal itching  Musculoskeletal:  Positive for arthralgias and back pain. Negative for joint swelling and neck pain.  Neurological: Negative.  Negative for tremors and numbness.  Hematological:  Negative for adenopathy. Does not bruise/bleed easily.  Psychiatric/Behavioral:  Positive for decreased concentration (takes adderall). Negative for behavioral problems (Depression), self-injury, sleep disturbance and suicidal ideas. The patient is not nervous/anxious.     Vital Signs: BP 135/82 Comment: 160/90  Pulse 78   Temp 97.7 F (36.5 C)   Resp 16   Ht 5\' 7"  (1.702 m)   Wt 232 lb (105.2 kg)   SpO2 97%   BMI 36.34 kg/m    Physical Exam Vitals reviewed.  Constitutional:      Appearance: Normal appearance.  HENT:     Head: Normocephalic and atraumatic.  Eyes:     Pupils: Pupils are equal, round, and reactive to light.  Cardiovascular:     Rate and Rhythm: Normal rate and regular rhythm.  Pulmonary:     Effort: Pulmonary effort is normal. No respiratory distress.  Neurological:     Mental Status: She is alert and oriented to person, place, and time.  Psychiatric:        Mood and Affect: Mood normal.        Behavior: Behavior normal.        Assessment/Plan: 1. Chronic pain of left knee Pain will improve with weight loss  2. Abnormal weight gain Her insurance covers zepbound, patient is willing to try, ordered medication, will need prior authorization.  - tirzepatide (ZEPBOUND) 2.5 MG/0.5ML Pen; Inject 2.5 mg into the skin once a week.  Dispense: 2 mL; Refill: 0 - tirzepatide (ZEPBOUND) 5 MG/0.5ML Pen; Inject 5 mg into the skin once a week.  Dispense: 2 mL; Refill: 3  3. Class 2 obesity due to excess calories without serious comorbidity with body mass index  (BMI) of 36.0 to 36.9 in adult Zepbound ordered, waiting for prior authorization.  - tirzepatide (ZEPBOUND) 2.5 MG/0.5ML Pen; Inject 2.5 mg into the skin once a week.  Dispense: 2 mL; Refill: 0 - tirzepatide (ZEPBOUND) 5 MG/0.5ML Pen; Inject 5 mg into the skin once a week.  Dispense: 2 mL; Refill: 3  4. Attention deficit disorder of adult Continue adderall as prescribed. Refill ordered x3 months, follow up in 3 months for additional refills.  - amphetamine-dextroamphetamine (ADDERALL) 20 MG tablet; TAKE 1 TABLET BY MOUTH TWICE A DAY AS NEEDED FOR FOCUS  Dispense: 60 tablet; Refill: 0 - amphetamine-dextroamphetamine (ADDERALL) 20 MG tablet; TAKE 1 TABLET BY MOUTH TWICE A DAY AS NEEDED FOR FOCUS  Dispense: 60 tablet; Refill: 0 - amphetamine-dextroamphetamine (ADDERALL) 20 MG tablet; TAKE 1 TABLET BY MOUTH TWICE A DAY AS NEEDED FOR FOCUS  Dispense: 60 tablet; Refill: 0  General Counseling: Elfida verbalizes understanding of the findings of todays visit and agrees with plan of treatment. I have discussed any further diagnostic evaluation that may be needed or ordered today. We also reviewed her medications today. she has been encouraged to call the office with any questions or concerns that should arise related to todays visit.    No orders of the defined types were placed in this encounter.   Meds ordered this encounter  Medications   amphetamine-dextroamphetamine (ADDERALL) 20 MG tablet    Sig: TAKE 1 TABLET BY MOUTH TWICE A DAY AS NEEDED FOR FOCUS    Dispense:  60 tablet    Refill:  0    Fill for 01/19/23   amphetamine-dextroamphetamine (ADDERALL) 20 MG tablet    Sig: TAKE 1 TABLET BY MOUTH TWICE A DAY AS NEEDED FOR FOCUS    Dispense:  60 tablet    Refill:  0    Fill for 02/16/23   amphetamine-dextroamphetamine (ADDERALL) 20 MG tablet    Sig: TAKE 1 TABLET BY MOUTH TWICE A DAY AS NEEDED FOR FOCUS    Dispense:  60 tablet    Refill:  0    Fill for 03/16/23   tirzepatide (ZEPBOUND) 2.5  MG/0.5ML Pen    Sig: Inject 2.5 mg into the skin once a week.    Dispense:  2 mL    Refill:  0    Dx code E66.01    Return in about 3 months (around 04/14/2023) for F/U, Weight loss,  PCP.   Total time spent:30 Minutes Time spent includes review of chart, medications, test results, and follow up plan with the patient.   [place] Controlled Substance Database was reviewed by me.  This patient was seen by Sallyanne Kuster, FNP-C in collaboration with Dr. Beverely Risen as a part of collaborative care agreement.    R. Tedd Sias, MSN, FNP-C Internal medicine

## 2023-01-21 NOTE — Telephone Encounter (Signed)
Please do PA

## 2023-01-23 ENCOUNTER — Telehealth: Payer: Self-pay

## 2023-01-23 NOTE — Telephone Encounter (Signed)
Completed P.A. for patient's Zepbound, resulted in denial because insurance is not willing to cover. Will ask Alyssa for next steps.

## 2023-01-24 ENCOUNTER — Other Ambulatory Visit: Payer: Self-pay | Admitting: Nurse Practitioner

## 2023-01-24 ENCOUNTER — Encounter: Payer: Self-pay | Admitting: Nurse Practitioner

## 2023-01-24 DIAGNOSIS — R635 Abnormal weight gain: Secondary | ICD-10-CM

## 2023-01-24 DIAGNOSIS — E6609 Other obesity due to excess calories: Secondary | ICD-10-CM

## 2023-01-24 MED ORDER — TIRZEPATIDE-WEIGHT MANAGEMENT 5 MG/0.5ML ~~LOC~~ SOAJ
5.0000 mg | SUBCUTANEOUS | 3 refills | Status: DC
Start: 2023-01-24 — End: 2023-06-11

## 2023-02-09 ENCOUNTER — Telehealth: Payer: Self-pay

## 2023-02-13 NOTE — Telephone Encounter (Signed)
Spoke to patient, she will call her insurance to see what they will cover.

## 2023-02-13 NOTE — Telephone Encounter (Signed)
Patient is notified to call insurance to see what they will cover.

## 2023-03-03 ENCOUNTER — Telehealth: Payer: Self-pay

## 2023-03-04 ENCOUNTER — Other Ambulatory Visit: Payer: Self-pay | Admitting: Nurse Practitioner

## 2023-03-04 DIAGNOSIS — G8929 Other chronic pain: Secondary | ICD-10-CM

## 2023-03-04 MED ORDER — TRAMADOL HCL 50 MG PO TABS
50.0000 mg | ORAL_TABLET | Freq: Four times a day (QID) | ORAL | 0 refills | Status: DC | PRN
Start: 2023-03-04 — End: 2023-10-26

## 2023-03-19 NOTE — Telephone Encounter (Signed)
Done

## 2023-03-30 DIAGNOSIS — M1711 Unilateral primary osteoarthritis, right knee: Secondary | ICD-10-CM | POA: Diagnosis not present

## 2023-04-13 ENCOUNTER — Encounter: Payer: Self-pay | Admitting: Nurse Practitioner

## 2023-04-13 ENCOUNTER — Ambulatory Visit (INDEPENDENT_AMBULATORY_CARE_PROVIDER_SITE_OTHER): Payer: BC Managed Care – PPO | Admitting: Nurse Practitioner

## 2023-04-13 VITALS — BP 138/88 | HR 100 | Temp 98.2°F | Resp 16 | Ht 67.0 in | Wt 221.8 lb

## 2023-04-13 DIAGNOSIS — F988 Other specified behavioral and emotional disorders with onset usually occurring in childhood and adolescence: Secondary | ICD-10-CM | POA: Diagnosis not present

## 2023-04-13 DIAGNOSIS — R634 Abnormal weight loss: Secondary | ICD-10-CM

## 2023-04-13 DIAGNOSIS — R5383 Other fatigue: Secondary | ICD-10-CM

## 2023-04-13 DIAGNOSIS — G44201 Tension-type headache, unspecified, intractable: Secondary | ICD-10-CM | POA: Diagnosis not present

## 2023-04-13 LAB — POCT GLYCOSYLATED HEMOGLOBIN (HGB A1C): Hemoglobin A1C: 5.9 % — AB (ref 4.0–5.6)

## 2023-04-13 MED ORDER — AMPHETAMINE-DEXTROAMPHETAMINE 20 MG PO TABS: ORAL_TABLET | ORAL | 0 refills | Status: DC

## 2023-04-13 MED ORDER — AMPHETAMINE-DEXTROAMPHETAMINE 20 MG PO TABS
ORAL_TABLET | ORAL | 0 refills | Status: DC
Start: 2023-04-24 — End: 2023-10-26

## 2023-04-13 NOTE — Progress Notes (Signed)
[facility] [address] [place], [place] [address]  Internal MEDICINE  Office Visit Note  Patient Name: Audrey Ball  [id number]  [id number]  Date of Service: 04/13/2023  Chief Complaint  Patient presents with   Hypertension   Follow-up    Weight loss, doesn't feel good doesn't know why    HPI Audrey Ball presents for a follow-up visit for ADHD, headache and weight loss. Fatigue -- feeling generally bad, reports nausea, dizziness, blurred vision and weight loss of 11 lbs since the end of July without trying.  Prediabetes -- has a prior A1c of 5.9 that was checked 16 years ago. Has a family history of diabetes, worried she might be developing diabetes Headache -- started 2-3 weeks ago, has been constant for several hours off and on, sharp pain located in the front of her head.  ADHD -- current dose of adderall is effective. Denies any palpitations or other adverse side effects. Headache could be a side effect but also could be caused by something else since she has not had this issue before with adderall. BP and heart rate are ok. Due for refills.   Current Medication: Outpatient Encounter Medications as of 04/13/2023  Medication Sig   betamethasone valerate ointment (VALISONE) 0.1 % Apply 1 Application topically daily as needed (rash). To affected area until resolved.   meloxicam (MOBIC) 15 MG tablet Take 1 tablet (15 mg total) by mouth daily.   tirzepatide (ZEPBOUND) 2.5 MG/0.5ML Pen INJECT 2.5 MG SUBCUTANEOUSLY WEEKLY   tirzepatide (ZEPBOUND) 5 MG/0.5ML Pen Inject 5 mg into the skin once a week.   traMADol (ULTRAM) 50 MG tablet Take 1 tablet (50 mg total) by mouth every 6 (six) hours as needed for moderate pain or severe pain.   [DISCONTINUED] amphetamine-dextroamphetamine (ADDERALL) 20 MG tablet TAKE 1 TABLET BY MOUTH TWICE A DAY AS NEEDED FOR FOCUS   [DISCONTINUED] amphetamine-dextroamphetamine (ADDERALL) 20 MG tablet TAKE 1 TABLET BY MOUTH TWICE A DAY AS NEEDED  FOR FOCUS   [DISCONTINUED] amphetamine-dextroamphetamine (ADDERALL) 20 MG tablet TAKE 1 TABLET BY MOUTH TWICE A DAY AS NEEDED FOR FOCUS   [DISCONTINUED] hydrochlorothiazide (HYDRODIURIL) 25 MG tablet Take 1 tablet (25 mg total) by mouth daily.   amphetamine-dextroamphetamine (ADDERALL) 20 MG tablet TAKE 1 TABLET BY MOUTH TWICE A DAY AS NEEDED FOR FOCUS   amphetamine-dextroamphetamine (ADDERALL) 20 MG tablet TAKE 1 TABLET BY MOUTH TWICE A DAY AS NEEDED FOR FOCUS   [START ON 06/19/2023] amphetamine-dextroamphetamine (ADDERALL) 20 MG tablet TAKE 1 TABLET BY MOUTH TWICE A DAY AS NEEDED FOR FOCUS   No facility-administered encounter medications on file as of 04/13/2023.    Surgical History: Past Surgical History:  Procedure Laterality Date   BACK SURGERY  2017   BREAST BIOPSY Left 05/27/2023   u/s bx,mass 10:00, RIBBON clip-path pending   BREAST BIOPSY Left 05/27/2023   u/s bx, axilla/BUTTERFLY clippath pending   BREAST BIOPSY Left 05/27/2023   [place] LT BREAST BX W LOC DEV 1ST LESION IMG BX SPEC US GUIDE 05/27/2023 [facility]   COLONOSCOPY WITH PROPOFOL N/A 08/16/2021   Procedure: COLONOSCOPY WITH PROPOFOL;  Surgeon: Toney Reil, MD;  Location: [facility] ENDOSCOPY;  Service: Gastroenterology;  Laterality: N/A;   GALLBLADDER SURGERY  1999   TOTAL VAGINAL HYSTERECTOMY  2016   bilateral salpingectomy at [facility] for abnormal uterine bleeding    Medical History: Past Medical History:  Diagnosis Date   Hypertension     Family History: Family History  Problem Relation Age of Onset  Leukemia Mother    Hypertension Mother    Diabetes Mother    Lung cancer Father    Breast cancer Maternal Grandmother    Breast cancer Paternal Grandmother     Social History   Socioeconomic History   Marital status: Single    Spouse name: Not on file   Number of children: Not on file   Years of education: Not on file   Highest education level: Not on file  Occupational History   Not on file   Tobacco Use   Smoking status: Never   Smokeless tobacco: Never  Vaping Use   Vaping status: Never Used  Substance and Sexual Activity   Alcohol use: No   Drug use: No   Sexual activity: Yes    Birth control/protection: Surgical  Other Topics Concern   Not on file  Social History Narrative   Not on file   Social Determinants of Health   Financial Resource Strain: Not on file  Food Insecurity: Not on file  Transportation Needs: Not on file  Physical Activity: Not on file  Stress: Not on file  Social Connections: Not on file  Intimate Partner Violence: Not on file      Review of Systems  Constitutional:  Positive for appetite change, fatigue and unexpected weight change. Negative for chills.  HENT:  Negative for congestion, rhinorrhea, sneezing and sore throat.   Eyes:  Negative for redness.  Respiratory: Negative.  Negative for cough, chest tightness, shortness of breath and wheezing.   Cardiovascular: Negative.  Negative for chest pain and palpitations.  Gastrointestinal:  Positive for nausea. Negative for abdominal pain, constipation, diarrhea and vomiting.  Genitourinary:  Negative for dysuria, flank pain (mild) and frequency.  Musculoskeletal:  Positive for arthralgias and back pain. Negative for joint swelling and neck pain.  Neurological:  Positive for dizziness and headaches. Negative for tremors and numbness.  Hematological:  Negative for adenopathy. Does not bruise/bleed easily.  Psychiatric/Behavioral:  Positive for decreased concentration (takes adderall) and sleep disturbance. Negative for behavioral problems (Depression), self-injury and suicidal ideas. The patient is not nervous/anxious.     Vital Signs: BP 138/88 Comment: 156/98  Pulse 100   Temp 98.2 F (36.8 C)   Resp 16   Ht 5\' 7"  (1.702 m)   Wt 221 lb 12.8 oz (100.6 kg)   SpO2 97%   BMI 34.74 kg/m    Physical Exam Vitals reviewed.  Constitutional:      General: She is not in acute  distress.    Appearance: Normal appearance. She is obese. She is ill-appearing.  HENT:     Head: Normocephalic and atraumatic.  Eyes:     Pupils: Pupils are equal, round, and reactive to light.  Cardiovascular:     Rate and Rhythm: Normal rate and regular rhythm.  Pulmonary:     Effort: Pulmonary effort is normal. No respiratory distress.  Skin:    General: Skin is warm and dry.  Neurological:     Mental Status: She is alert and oriented to person, place, and time.  Psychiatric:        Mood and Affect: Mood normal.        Behavior: Behavior normal.        Assessment/Plan: 1. Other fatigue Declined all interventions offered. Patient will call clinic if symptoms worsen.   2. Acute intractable tension-type headache Discussed her headache, patient declined any imaging, medication, labs or referrals. Wants to wait and see if it gets better on its  own. Declined all interventions offered. Will call clinic if pain becomes worse and intolerable.   3. Recent unexplained weight loss A1c is prediabetic which is no change from prior A1c checked 16 years ago.  - POCT glycosylated hemoglobin (Hb A1C)  4. Attention deficit disorder of adult Continue adderall as prescribed, follow up in 3 months for additional refills. UDS due at next office visit.  - amphetamine-dextroamphetamine (ADDERALL) 20 MG tablet; TAKE 1 TABLET BY MOUTH TWICE A DAY AS NEEDED FOR FOCUS  Dispense: 60 tablet; Refill: 0 - amphetamine-dextroamphetamine (ADDERALL) 20 MG tablet; TAKE 1 TABLET BY MOUTH TWICE A DAY AS NEEDED FOR FOCUS  Dispense: 60 tablet; Refill: 0 - amphetamine-dextroamphetamine (ADDERALL) 20 MG tablet; TAKE 1 TABLET BY MOUTH TWICE A DAY AS NEEDED FOR FOCUS  Dispense: 60 tablet; Refill: 0   General Counseling: Audrey Ball verbalizes understanding of the findings of todays visit and agrees with plan of treatment. I have discussed any further diagnostic evaluation that may be needed or ordered today. We also  reviewed her medications today. she has been encouraged to call the office with any questions or concerns that should arise related to todays visit.    Orders Placed This Encounter  Procedures   POCT glycosylated hemoglobin (Hb A1C)    Meds ordered this encounter  Medications   amphetamine-dextroamphetamine (ADDERALL) 20 MG tablet    Sig: TAKE 1 TABLET BY MOUTH TWICE A DAY AS NEEDED FOR FOCUS    Dispense:  60 tablet    Refill:  0    Fill for November   amphetamine-dextroamphetamine (ADDERALL) 20 MG tablet    Sig: TAKE 1 TABLET BY MOUTH TWICE A DAY AS NEEDED FOR FOCUS    Dispense:  60 tablet    Refill:  0    Fill for 05/22/23   amphetamine-dextroamphetamine (ADDERALL) 20 MG tablet    Sig: TAKE 1 TABLET BY MOUTH TWICE A DAY AS NEEDED FOR FOCUS    Dispense:  60 tablet    Refill:  0    Fill for 06/19/23    Return in about 3 months (around 07/08/2023) for F/U, ADHD med check, [facility] PCP and otherwise as needed. .   Total time spent:30 Minutes Time spent includes review of chart, medications, test results, and follow up plan with the patient.   [place] Controlled Substance Database was reviewed by me.  This patient was seen by Sallyanne Kuster, FNP-C in collaboration with Dr. Beverely Risen as a part of collaborative care agreement.   Charlie Seda R. Tedd Sias, MSN, FNP-C Internal medicine

## 2023-05-12 ENCOUNTER — Other Ambulatory Visit: Payer: Self-pay | Admitting: Nurse Practitioner

## 2023-05-12 DIAGNOSIS — I10 Essential (primary) hypertension: Secondary | ICD-10-CM

## 2023-05-13 ENCOUNTER — Ambulatory Visit: Payer: BC Managed Care – PPO | Admitting: Nurse Practitioner

## 2023-05-13 ENCOUNTER — Encounter: Payer: Self-pay | Admitting: Nurse Practitioner

## 2023-05-13 VITALS — BP 150/90 | HR 98 | Temp 98.0°F | Resp 16 | Ht 67.0 in | Wt 220.6 lb

## 2023-05-13 DIAGNOSIS — N6322 Unspecified lump in the left breast, upper inner quadrant: Secondary | ICD-10-CM

## 2023-05-13 NOTE — Progress Notes (Signed)
[facility] [address] [place], [place] [address]  Internal MEDICINE  Office Visit Note  Patient Name: Audrey Ball  [id number]  [id number]  Date of Service: 05/13/2023  Chief Complaint  Patient presents with   Acute Visit    Left breast lump     HPI Audrey Ball presents for an acute sick visit for a breast lump --noticed lump on Monday morning this week left breast upper inner quadrant.  No pain or tenderness.        Current Medication:  Outpatient Encounter Medications as of 05/13/2023  Medication Sig   amphetamine-dextroamphetamine (ADDERALL) 20 MG tablet TAKE 1 TABLET BY MOUTH TWICE A DAY AS NEEDED FOR FOCUS   [START ON 05/22/2023] amphetamine-dextroamphetamine (ADDERALL) 20 MG tablet TAKE 1 TABLET BY MOUTH TWICE A DAY AS NEEDED FOR FOCUS   [START ON 06/19/2023] amphetamine-dextroamphetamine (ADDERALL) 20 MG tablet TAKE 1 TABLET BY MOUTH TWICE A DAY AS NEEDED FOR FOCUS   betamethasone valerate ointment (VALISONE) 0.1 % Apply 1 Application topically daily as needed (rash). To affected area until resolved.   hydrochlorothiazide (HYDRODIURIL) 25 MG tablet TAKE 1 TABLET (25 MG TOTAL) BY MOUTH DAILY.   meloxicam (MOBIC) 15 MG tablet Take 1 tablet (15 mg total) by mouth daily.   tirzepatide (ZEPBOUND) 2.5 MG/0.5ML Pen INJECT 2.5 MG SUBCUTANEOUSLY WEEKLY   tirzepatide (ZEPBOUND) 5 MG/0.5ML Pen Inject 5 mg into the skin once a week.   traMADol (ULTRAM) 50 MG tablet Take 1 tablet (50 mg total) by mouth every 6 (six) hours as needed for moderate pain or severe pain.   No facility-administered encounter medications on file as of 05/13/2023.      Medical History: Past Medical History:  Diagnosis Date   Hypertension      Vital Signs: BP (!) 150/90   Pulse 98   Temp 98 F (36.7 C)   Resp 16   Ht 5\' 7"  (1.702 m)   Wt 220 lb 9.6 oz (100.1 kg)   SpO2 98%   BMI 34.55 kg/m    Review of Systems  Constitutional: Negative.  Negative for fatigue.   Respiratory: Negative.  Negative for cough, chest tightness, shortness of breath and wheezing.   Cardiovascular: Negative.  Negative for chest pain and palpitations.  Gastrointestinal: Negative.   Musculoskeletal: Negative.   Psychiatric/Behavioral:  The patient is nervous/anxious (regarding diagnostic mammogram).     Physical Exam Vitals reviewed.  Constitutional:      General: She is not in acute distress.    Appearance: Normal appearance. She is obese. She is not ill-appearing.  HENT:     Head: Normocephalic and atraumatic.  Eyes:     Pupils: Pupils are equal, round, and reactive to light.  Cardiovascular:     Rate and Rhythm: Normal rate and regular rhythm.  Pulmonary:     Effort: Pulmonary effort is normal. No respiratory distress.  Neurological:     Mental Status: She is alert and oriented to person, place, and time.  Psychiatric:        Mood and Affect: Mood normal.        Behavior: Behavior normal.       Assessment/Plan: 1. Breast lump on left side at 11 o'clock position Diagnostic mammogram and ultrasound ordered.  - MM 3D DIAGNOSTIC MAMMOGRAM BILATERAL BREAST; Future - [place] LIMITED ULTRASOUND INCLUDING AXILLA LEFT BREAST ; Future   General Counseling: [name] verbalizes understanding of the findings of todays visit and agrees with plan of treatment. I have discussed any  further diagnostic evaluation that may be needed or ordered today. We also reviewed her medications today. she has been encouraged to call the office with any questions or concerns that should arise related to todays visit.    Counseling:    Orders Placed This Encounter  Procedures   MM 3D DIAGNOSTIC MAMMOGRAM BILATERAL BREAST   US LIMITED ULTRASOUND INCLUDING AXILLA LEFT BREAST     No orders of the defined types were placed in this encounter.   Return if symptoms worsen or fail to improve.  [place] Controlled Substance Database was reviewed by me for overdose risk score (ORS)  Time  spent:20 Minutes Time spent with patient included reviewing progress notes, labs, imaging studies, and discussing plan for follow up.   This patient was seen by Sallyanne Kuster, FNP-C in collaboration with Dr. Beverely Risen as a part of collaborative care agreement.  Herve Haug R. Tedd Sias, MSN, FNP-C Internal Medicine

## 2023-05-15 ENCOUNTER — Other Ambulatory Visit: Payer: Self-pay | Admitting: *Deleted

## 2023-05-15 ENCOUNTER — Inpatient Hospital Stay
Admission: RE | Admit: 2023-05-15 | Discharge: 2023-05-15 | Disposition: A | Discharge status: Home / Self Care | Payer: Self-pay | Source: Ambulatory Visit | Attending: Nurse Practitioner | Admitting: Nurse Practitioner

## 2023-05-15 DIAGNOSIS — Z1231 Encounter for screening mammogram for malignant neoplasm of breast: Secondary | ICD-10-CM

## 2023-05-25 ENCOUNTER — Ambulatory Visit
Admission: RE | Admit: 2023-05-25 | Discharge: 2023-05-25 | Disposition: A | Discharge status: Home / Self Care | Payer: BC Managed Care – PPO | Source: Ambulatory Visit | Attending: Nurse Practitioner | Admitting: Nurse Practitioner

## 2023-05-25 DIAGNOSIS — N6322 Unspecified lump in the left breast, upper inner quadrant: Secondary | ICD-10-CM

## 2023-05-26 ENCOUNTER — Other Ambulatory Visit: Payer: Self-pay | Admitting: Nurse Practitioner

## 2023-05-26 DIAGNOSIS — R928 Other abnormal and inconclusive findings on diagnostic imaging of breast: Secondary | ICD-10-CM

## 2023-05-26 DIAGNOSIS — R599 Enlarged lymph nodes, unspecified: Secondary | ICD-10-CM

## 2023-05-26 DIAGNOSIS — N63 Unspecified lump in unspecified breast: Secondary | ICD-10-CM

## 2023-05-27 ENCOUNTER — Ambulatory Visit
Admission: RE | Admit: 2023-05-27 | Discharge: 2023-05-27 | Disposition: A | Discharge status: Home / Self Care | Payer: BC Managed Care – PPO | Source: Ambulatory Visit | Attending: Nurse Practitioner | Admitting: Nurse Practitioner

## 2023-05-27 DIAGNOSIS — R599 Enlarged lymph nodes, unspecified: Secondary | ICD-10-CM

## 2023-05-27 DIAGNOSIS — N6322 Unspecified lump in the left breast, upper inner quadrant: Secondary | ICD-10-CM | POA: Diagnosis not present

## 2023-05-27 DIAGNOSIS — C773 Secondary and unspecified malignant neoplasm of axilla and upper limb lymph nodes: Secondary | ICD-10-CM | POA: Insufficient documentation

## 2023-05-27 DIAGNOSIS — C50212 Malignant neoplasm of upper-inner quadrant of left female breast: Secondary | ICD-10-CM | POA: Insufficient documentation

## 2023-05-27 DIAGNOSIS — R928 Other abnormal and inconclusive findings on diagnostic imaging of breast: Secondary | ICD-10-CM

## 2023-05-27 DIAGNOSIS — N63 Unspecified lump in unspecified breast: Secondary | ICD-10-CM | POA: Diagnosis not present

## 2023-05-27 HISTORY — PX: BREAST BIOPSY: SHX20

## 2023-05-27 MED ORDER — LIDOCAINE-EPINEPHRINE 1 %-1:100000 IJ SOLN
8.0000 mL | Freq: Once | INTRAMUSCULAR | Status: AC
  Administered 2023-05-27: 8 mL
  Filled 2023-05-27: qty 8

## 2023-05-27 MED ORDER — LIDOCAINE-EPINEPHRINE 1 %-1:100000 IJ SOLN
5.0000 mL | Freq: Once | INTRAMUSCULAR | Status: AC
  Administered 2023-05-27: 5 mL
  Filled 2023-05-27: qty 5

## 2023-05-27 MED ORDER — LIDOCAINE 1 % OPTIME INJ - NO CHARGE
2.0000 mL | Freq: Once | INTRAMUSCULAR | Status: AC
  Administered 2023-05-27: 2 mL
  Filled 2023-05-27: qty 2

## 2023-05-28 LAB — SURGICAL PATHOLOGY

## 2023-05-29 ENCOUNTER — Encounter: Payer: Self-pay | Admitting: *Deleted

## 2023-05-29 NOTE — Progress Notes (Signed)
Received referral for newly diagnosed breast cancer from [facility].  Navigation initiated.  Discussed surgeons and medical oncologists, also sent mychart message with names of physicians.   I will call her back on Monday to see which doctors she would like to see.

## 2023-05-31 ENCOUNTER — Encounter: Payer: Self-pay | Admitting: Nurse Practitioner

## 2023-06-01 ENCOUNTER — Encounter: Payer: Self-pay | Admitting: *Deleted

## 2023-06-01 DIAGNOSIS — C50919 Malignant neoplasm of unspecified site of unspecified female breast: Secondary | ICD-10-CM

## 2023-06-01 NOTE — Progress Notes (Signed)
Audrey Ball would like to see Dr. Smith Robert.   She is scheduled for 12/11 at 2:30pm.  She wants to discuss surgeons with Dr. Smith Robert and has not decided who she would like to see at this time.

## 2023-06-03 ENCOUNTER — Inpatient Hospital Stay: Payer: BC Managed Care – PPO

## 2023-06-03 ENCOUNTER — Encounter: Payer: Self-pay | Admitting: *Deleted

## 2023-06-03 ENCOUNTER — Inpatient Hospital Stay: Payer: BC Managed Care – PPO | Attending: Oncology | Admitting: Oncology

## 2023-06-03 ENCOUNTER — Encounter: Payer: Self-pay | Admitting: Oncology

## 2023-06-03 VITALS — BP 149/85 | HR 71 | Temp 97.2°F | Resp 18 | Ht 67.0 in | Wt 219.3 lb

## 2023-06-03 DIAGNOSIS — Z7189 Other specified counseling: Secondary | ICD-10-CM

## 2023-06-03 DIAGNOSIS — C50919 Malignant neoplasm of unspecified site of unspecified female breast: Secondary | ICD-10-CM

## 2023-06-03 DIAGNOSIS — C50212 Malignant neoplasm of upper-inner quadrant of left female breast: Secondary | ICD-10-CM | POA: Insufficient documentation

## 2023-06-03 DIAGNOSIS — C50412 Malignant neoplasm of upper-outer quadrant of left female breast: Secondary | ICD-10-CM

## 2023-06-03 DIAGNOSIS — Z170 Estrogen receptor positive status [ER+]: Secondary | ICD-10-CM | POA: Insufficient documentation

## 2023-06-03 NOTE — Progress Notes (Signed)
Accompanied patient and family to initial medical oncology appointment.   Reviewed Breast Cancer treatment handbook.   Care plan summary given to patient.   Reviewed outreach programs and cancer center services.   

## 2023-06-03 NOTE — Progress Notes (Signed)
Hematology/Oncology Consult note [facility] [phone] [phone]  Patient Care Team: Sallyanne Kuster, NP as PCP - General (Nurse Practitioner) Hulen Luster, RN as Oncology Nurse Navigator Creig Hines, MD as Consulting Physician (Oncology)   Name of the patient: Audrey Ball  [id number]  1971/01/24    Reason for referral- new diagnosis of breast cancer   Referring physician- Sallyanne Kuster NP  Date of visit: 06/03/23   History of presenting illness- Patient is a 52 year old female who noticed palpable left breast mass in November 2024.  This was followed by diagnostic bilateral mammogram which showed a 12 mm mass at the site of palpable concern concerning for malignancy along with nonenlarged left axillary lymph node.  Ultrasound also confirmed the same findings.  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  Tumor was ER 100% positive, PR 70% positive and HER2 negative +1.  No prior history of birth control use.She is G2, P2.  She is s/p hysterectomy but still has ovaries in situ..  Family history significant for breast cancer in both her paternal and maternal grandmother.  Leukemia in her mother and lung cancer in her father.  ECOG PS- 0  Pain scale- 0   Review of systems- Review of Systems  Constitutional:  Negative for chills, fever, malaise/fatigue and weight loss.  HENT:  Negative for congestion, ear discharge and nosebleeds.   Eyes:  Negative for blurred vision.  Respiratory:  Negative for cough, hemoptysis, sputum production, shortness of breath and wheezing.   Cardiovascular:  Negative for chest pain, palpitations, orthopnea and claudication.  Gastrointestinal:  Negative for abdominal pain, blood in stool, constipation, diarrhea, heartburn, melena, nausea and vomiting.  Genitourinary:  Negative for dysuria,  flank pain, frequency, hematuria and urgency.  Musculoskeletal:  Negative for back pain, joint pain and myalgias.  Skin:  Negative for rash.  Neurological:  Negative for dizziness, tingling, focal weakness, seizures, weakness and headaches.  Endo/Heme/Allergies:  Does not bruise/bleed easily.  Psychiatric/Behavioral:  Negative for depression and suicidal ideas. The patient does not have insomnia.     Allergies  Allergen Reactions   Bactrim [Sulfamethoxazole-Trimethoprim] Hives and Swelling    Swelling in lips and leg   Sulfa Antibiotics Hives   Penicillins Rash   Propoxyphene Other (See Comments)    headache     Patient Active Problem List   Diagnosis Date Noted   Urinary tract infection with hematuria 05/23/2020   Plantar fasciitis of left foot 11/09/2019   Encounter for general adult medical examination with abnormal findings 05/02/2019   Night muscle spasms 05/02/2019   Chronic bilateral low back pain without sciatica 05/02/2019   Encounter for screening mammogram for malignant neoplasm of breast 05/02/2019   Dysuria 05/02/2019   Eczema 01/30/2019   Food allergy 12/01/2018   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   Candidal vaginitis 06/15/2018   Encounter for long-term (current) use of medications 03/08/2018   Attention deficit disorder of adult 03/08/2018   Fever blister 03/08/2018   Cough 11/02/2015   Spinal stenosis of lumbar region with radiculopathy 11/02/2015   Abnormal thyroid function test 06/27/2014   Abnormal uterine bleeding (AUB) 06/27/2014   Lumbosacral radiculopathy at S1 11/25/2013   Chronic pain of left knee 05/21/2007   Essential hypertension, benign 04/20/2007   Esophagitis 04/20/2007     Past Medical History:  Diagnosis Date   Breast cancer (HCC)  Hypertension      Past Surgical History:  Procedure Laterality Date   BACK SURGERY  2017   BREAST BIOPSY Left 05/27/2023   u/s bx,mass 10:00, RIBBON clip-path pending   BREAST BIOPSY Left 05/27/2023    u/s bx, axilla/BUTTERFLY clippath pending   BREAST BIOPSY Left 05/27/2023   [place] LT BREAST BX W LOC DEV 1ST LESION IMG BX SPEC US GUIDE 05/27/2023 [facility]   COLONOSCOPY WITH PROPOFOL N/A 08/16/2021   Procedure: COLONOSCOPY WITH PROPOFOL;  Surgeon: Toney Reil, MD;  Location: [facility] ENDOSCOPY;  Service: Gastroenterology;  Laterality: N/A;   GALLBLADDER SURGERY  1999   TOTAL VAGINAL HYSTERECTOMY  2016   bilateral salpingectomy at [facility] for abnormal uterine bleeding    Social History   Socioeconomic History   Marital status: Single    Spouse name: Not on file   Number of children: Not on file   Years of education: Not on file   Highest education level: Not on file  Occupational History   Not on file  Tobacco Use   Smoking status: Never   Smokeless tobacco: Never  Vaping Use   Vaping status: Never Used  Substance and Sexual Activity   Alcohol use: No   Drug use: No   Sexual activity: Yes    Birth control/protection: Surgical  Other Topics Concern   Not on file  Social History Narrative   Not on file   Social Determinants of Health   Financial Resource Strain: Not on file  Food Insecurity: No Food Insecurity (06/03/2023)   Hunger Vital Sign    Worried About Running Out of Food in the Last Year: Never true    Ran Out of Food in the Last Year: Never true  Transportation Needs: Not on file  Physical Activity: Not on file  Stress: Not on file  Social Connections: Not on file  Intimate Partner Violence: Not At Risk (06/03/2023)   Humiliation, Afraid, Rape, and Kick questionnaire    Fear of Current or Ex-Partner: No    Emotionally Abused: No    Physically Abused: No    Sexually Abused: No     Family History  Problem Relation Age of Onset   Leukemia Mother    Hypertension Mother    Diabetes Mother    Lung cancer Father    Cancer Brother        Currently in remission.   Breast cancer Maternal Grandmother    Breast cancer Paternal Grandmother      Current  Outpatient Medications:    betamethasone valerate ointment (VALISONE) 0.1 %, Apply 1 Application topically daily as needed (rash). To affected area until resolved., Disp: 45 g, Rfl: 5   hydrochlorothiazide (HYDRODIURIL) 25 MG tablet, TAKE 1 TABLET (25 MG TOTAL) BY MOUTH DAILY., Disp: 90 tablet, Rfl: 1   meloxicam (MOBIC) 15 MG tablet, Take 1 tablet (15 mg total) by mouth daily., Disp: 90 tablet, Rfl: 1   traMADol (ULTRAM) 50 MG tablet, Take 1 tablet (50 mg total) by mouth every 6 (six) hours as needed for moderate pain or severe pain., Disp: 20 tablet, Rfl: 0   amphetamine-dextroamphetamine (ADDERALL) 20 MG tablet, TAKE 1 TABLET BY MOUTH TWICE A DAY AS NEEDED FOR FOCUS, Disp: 60 tablet, Rfl: 0   amphetamine-dextroamphetamine (ADDERALL) 20 MG tablet, TAKE 1 TABLET BY MOUTH TWICE A DAY AS NEEDED FOR FOCUS, Disp: 60 tablet, Rfl: 0   [START ON 06/19/2023] amphetamine-dextroamphetamine (ADDERALL) 20 MG tablet, TAKE 1 TABLET BY MOUTH TWICE A DAY AS  NEEDED FOR FOCUS, Disp: 60 tablet, Rfl: 0   tirzepatide (ZEPBOUND) 2.5 MG/0.5ML Pen, INJECT 2.5 MG SUBCUTANEOUSLY WEEKLY (Patient not taking: Reported on 06/03/2023), Disp: 2 mL, Rfl: 0   tirzepatide (ZEPBOUND) 5 MG/0.5ML Pen, Inject 5 mg into the skin once a week. (Patient not taking: Reported on 06/03/2023), Disp: 2 mL, Rfl: 3   Physical exam:  Vitals:   06/03/23 1442 06/03/23 1448  BP: (!) 148/94 (!) 149/85  Pulse: 71   Resp: 18   Temp: (!) 97.2 F (36.2 C)   TempSrc: Tympanic   SpO2: 99%   Weight: 219 lb 4.8 oz (99.5 kg)   Height: 5\' 7"  (1.702 m)    Physical Exam Cardiovascular:     Rate and Rhythm: Normal rate and regular rhythm.     Heart sounds: Normal heart sounds.  Pulmonary:     Effort: Pulmonary effort is normal.     Breath sounds: Normal breath sounds.  Abdominal:     General: Bowel sounds are normal.     Palpations: Abdomen is soft.  Skin:    General: Skin is warm and dry.  Neurological:     Mental Status: She is alert and  oriented to person, place, and time.   Breast exam: No palpable masses in the right breast.  No palpable bilateral axillary adenopathy.  There is induration at the site of left breast biopsy where an ill-defined mass of about 2 cm is palpable       Latest Ref Rng & Units 07/07/2022    9:39 AM  CMP  Glucose 70 - 99 mg/dL 742   BUN 6 - 24 mg/dL 13   Creatinine 5.95 - 1.00 mg/dL 6.38   Sodium 756 - 433 mmol/L 143   Potassium 3.5 - 5.2 mmol/L 3.6   Chloride 96 - 106 mmol/L 99   CO2 20 - 29 mmol/L 27   Calcium 8.7 - 10.2 mg/dL 29.5   Total Protein 6.0 - 8.5 g/dL 7.4   Total Bilirubin 0.0 - 1.2 mg/dL 0.4   Alkaline Phos 44 - 121 IU/L 98   AST 0 - 40 IU/L 21   ALT 0 - 32 IU/L 22       Latest Ref Rng & Units 07/07/2022    9:39 AM  CBC  WBC 3.4 - 10.8 x10E3/uL 5.5   Hemoglobin 11.1 - 15.9 g/dL 18.8   Hematocrit 41.6 - 46.6 % 42.6   Platelets 150 - 450 x10E3/uL 271     No images are attached to the encounter.  [place] LT BREAST BX W LOC DEV 1ST LESION IMG BX SPEC US GUIDE  Addendum Date: 05/28/2023   ADDENDUM REPORT: 05/28/2023 12:41 ADDENDUM: PATHOLOGY revealed: Site 1. Breast, LEFT, needle core biopsy, 10 o'clock, 9cmfn, ribbon : - INVASIVE MAMMARY CARCINOMA WITH LOBULAR FEATURES, - OVERALL GRADE: 2 Pathology results are CONCORDANT with imaging findings, per Meda Klinefelter, MD. PATHOLOGY revealed: Site 2. Lymph node, needle/core biopsy, LEFT axilla, hydromark : - MAMMARY CARCINOMA INVOLVING FIBROADIPOSE TISSUE - DEFINITIVE LYMPHOID TISSUE IS NOT IDENTIFIED Pathology results are CONCORDANT with imaging findings, per Meda Klinefelter, RN. Pathology results and recommendations below were discussed with patient by telephone on 05/28/2023. Patient reported biopsy site within normal limits with slight tenderness at the site. Post biopsy care instructions were reviewed, questions were answered and my direct phone number was provided to patient. Patient was instructed to call [facility]  if any concerns or questions arise related to the biopsy. RECOMMENDATIONS: 1. Surgical and  oncological consultation. Request for surgical and oncological consultation relayed to Irving Shows RN at [facility] by Donell Sievert, RN on 05/28/2023. 2. Recommend pretreatment bilateral breast MRI with and without contrast to determine extent of breast disease given breast density and young age. Pathology results reported by Donell Sievert, RN on 05/28/2023. Electronically Signed   By: Meda Klinefelter M.D.   On: 05/28/2023 12:41   Result Date: 05/28/2023 CLINICAL DATA:  Palpable LEFT breast mass and enlarged LEFT axillary lymph node EXAM: ULTRASOUND GUIDED LEFT BREAST CORE NEEDLE BIOPSY [place] AXILLARY NODE CORE BIOPSY LEFT COMPARISON:  Previous exam(s). PROCEDURE: I met with the patient and we discussed the procedure of ultrasound-guided biopsy, including benefits and alternatives. We discussed the high likelihood of a successful procedure. We discussed the risks of the procedure, including infection, bleeding, tissue injury, clip migration, and inadequate sampling. Informed written consent was given. The usual time-out protocol was performed immediately prior to the procedure. Site 1: LEFT breast mass 10 o'clock 9 cm from the nipple Lesion quadrant: Upper inner quadrant Using sterile technique and 1% lidocaine and 1% lidocaine with epinephrine as local anesthetic, under direct ultrasound visualization, a 14 gauge spring-loaded device was used to perform biopsy of a LEFT breast mass at 10 o'clock using a lateral approach. At the conclusion of the procedure a RIBBON shaped tissue marker clip was deployed into the biopsy cavity. Follow up 2 view mammogram was performed and dictated separately. Site 2: LEFT axillary lymph node Using sterile technique and 1% lidocaine and 1% lidocaine with epinephrine as local anesthetic, under direct ultrasound visualization, a 14 gauge spring-loaded device was used to perform  biopsy of a LEFT axillary lymph node using a lateral approach. At the conclusion of the procedure a HYDROMARK 4 butterfly shaped tissue marker clip was deployed into the biopsy cavity. Follow up 2 view mammogram was performed and dictated separately. IMPRESSION: Ultrasound guided biopsy of a LEFT breast mass and LEFT axillary lymph node. No apparent complications. Electronically Signed: By: Meda Klinefelter M.D. On: 05/27/2023 10:46   [place] AXILLARY NODE CORE BIOPSY LEFT  Addendum Date: 05/28/2023   ADDENDUM REPORT: 05/28/2023 12:41 ADDENDUM: PATHOLOGY revealed: Site 1. Breast, LEFT, needle core biopsy, 10 o'clock, 9cmfn, ribbon : - INVASIVE MAMMARY CARCINOMA WITH LOBULAR FEATURES, - OVERALL GRADE: 2 Pathology results are CONCORDANT with imaging findings, per Meda Klinefelter, MD. PATHOLOGY revealed: Site 2. Lymph node, needle/core biopsy, LEFT axilla, hydromark : - MAMMARY CARCINOMA INVOLVING FIBROADIPOSE TISSUE - DEFINITIVE LYMPHOID TISSUE IS NOT IDENTIFIED Pathology results are CONCORDANT with imaging findings, per Meda Klinefelter, RN. Pathology results and recommendations below were discussed with patient by telephone on 05/28/2023. Patient reported biopsy site within normal limits with slight tenderness at the site. Post biopsy care instructions were reviewed, questions were answered and my direct phone number was provided to patient. Patient was instructed to call [facility] if any concerns or questions arise related to the biopsy. RECOMMENDATIONS: 1. Surgical and oncological consultation. Request for surgical and oncological consultation relayed to Irving Shows RN at [facility] by Donell Sievert, RN on 05/28/2023. 2. Recommend pretreatment bilateral breast MRI with and without contrast to determine extent of breast disease given breast density and young age. Pathology results reported by Donell Sievert, RN on 05/28/2023. Electronically Signed   By: Meda Klinefelter M.D.   On:  05/28/2023 12:41   Result Date: 05/28/2023 CLINICAL DATA:  Palpable LEFT breast mass and enlarged LEFT axillary lymph node EXAM: ULTRASOUND GUIDED  LEFT BREAST CORE NEEDLE BIOPSY [place] AXILLARY NODE CORE BIOPSY LEFT COMPARISON:  Previous exam(s). PROCEDURE: I met with the patient and we discussed the procedure of ultrasound-guided biopsy, including benefits and alternatives. We discussed the high likelihood of a successful procedure. We discussed the risks of the procedure, including infection, bleeding, tissue injury, clip migration, and inadequate sampling. Informed written consent was given. The usual time-out protocol was performed immediately prior to the procedure. Site 1: LEFT breast mass 10 o'clock 9 cm from the nipple Lesion quadrant: Upper inner quadrant Using sterile technique and 1% lidocaine and 1% lidocaine with epinephrine as local anesthetic, under direct ultrasound visualization, a 14 gauge spring-loaded device was used to perform biopsy of a LEFT breast mass at 10 o'clock using a lateral approach. At the conclusion of the procedure a RIBBON shaped tissue marker clip was deployed into the biopsy cavity. Follow up 2 view mammogram was performed and dictated separately. Site 2: LEFT axillary lymph node Using sterile technique and 1% lidocaine and 1% lidocaine with epinephrine as local anesthetic, under direct ultrasound visualization, a 14 gauge spring-loaded device was used to perform biopsy of a LEFT axillary lymph node using a lateral approach. At the conclusion of the procedure a HYDROMARK 4 butterfly shaped tissue marker clip was deployed into the biopsy cavity. Follow up 2 view mammogram was performed and dictated separately. IMPRESSION: Ultrasound guided biopsy of a LEFT breast mass and LEFT axillary lymph node. No apparent complications. Electronically Signed: By: Meda Klinefelter M.D. On: 05/27/2023 10:46   MM CLIP PLACEMENT LEFT  Result Date: 05/27/2023 CLINICAL DATA:  Status post 2 site  ultrasound-guided biopsy EXAM: 3D DIAGNOSTIC LEFT MAMMOGRAM POST ULTRASOUND BIOPSY COMPARISON:  Previous exam(s). FINDINGS: 3D Mammographic images were obtained following ultrasound guided biopsy of a mass at 10 o'clock 9 cm from nipple. The RIBBON biopsy marking clip is in expected position at the site of biopsy. 3D Mammographic images were obtained following ultrasound guided biopsy of enlarged LEFT axillary lymph node. [facility] biopsy marking clip is in expected position at the site of biopsy. IMPRESSION: 1. Appropriate positioning of the RIBBON shaped biopsy marking clip at the site of biopsy in the upper inner breast. 2. Appropriate positioning of the [facility] shaped biopsy marking clip at the site of biopsy in the LEFT axilla enlarged axillary lymph node. Final Assessment: Post Procedure Mammograms for Marker Placement Electronically Signed   By: Meda Klinefelter M.D.   On: 05/27/2023 10:43   MM 3D DIAGNOSTIC MAMMOGRAM BILATERAL BREAST  Result Date: 05/25/2023 CLINICAL DATA:  Palpable LEFT breast mass EXAM: DIGITAL DIAGNOSTIC BILATERAL MAMMOGRAM WITH TOMOSYNTHESIS AND CAD; ULTRASOUND LEFT BREAST LIMITED TECHNIQUE: Bilateral digital diagnostic mammography and breast tomosynthesis was performed. The images were evaluated with computer-aided detection. ; Targeted ultrasound examination of the left breast was performed. COMPARISON:  Previous 2016 exams. ACR Breast Density Category c: The breasts are heterogeneously dense, which may obscure small masses. FINDINGS: Spot compression tomosynthesis views were obtained of the site of palpable concern in the LEFT breast. There is an irregular mass with associated architectural distortion noted subjacent to the site of palpable concern. There is an enlarged LEFT axillary lymph node noted in the LEFT axilla. No additional suspicious findings are noted. No suspicious mass, distortion, or microcalcifications are identified to suggest presence of malignancy in the  RIGHT breast. Targeted ultrasound was performed of the site of palpable concern. At 10 o'clock 9 cm from the nipple, there is an irregular hypoechoic mass with irregular margins. It  measures 10 x 8 x 12 mm. This corresponds to the irregular mass noted mammographically. Targeted ultrasound was performed of the LEFT axilla. There is an enlarged LEFT axillary lymph node with a cortical thickness of 7-8 mm. IMPRESSION: 1. There is a 12 mm mass at the site of palpable concern in the LEFT breast which is suspicious for malignancy. Recommend ultrasound-guided biopsy for definitive characterization. 2. There is an enlarged LEFT axillary lymph node. Recommend ultrasound-guided biopsy for definitive characterization. 3. No mammographic evidence of malignancy in the RIGHT breast. RECOMMENDATION: 1. LEFT breast ultrasound-guided biopsy x1 2. LEFT axillary ultrasound-guided biopsy x1 3. With malignant results, breast MRI with and without contrast would be recommended. I have discussed the findings and recommendations with the patient. The biopsy procedure was discussed with the patient and questions were answered. Patient expressed their understanding of the biopsy recommendation. Patient will be scheduled for biopsy at her earliest convenience by the schedulers. Ordering provider will be notified. If applicable, a reminder letter will be sent to the patient regarding the next appointment. BI-RADS CATEGORY  5: Highly suggestive of malignancy. Electronically Signed   By: Meda Klinefelter M.D.   On: 05/25/2023 16:04   [place] LIMITED ULTRASOUND INCLUDING AXILLA LEFT BREAST   Result Date: 05/25/2023 CLINICAL DATA:  Palpable LEFT breast mass EXAM: DIGITAL DIAGNOSTIC BILATERAL MAMMOGRAM WITH TOMOSYNTHESIS AND CAD; ULTRASOUND LEFT BREAST LIMITED TECHNIQUE: Bilateral digital diagnostic mammography and breast tomosynthesis was performed. The images were evaluated with computer-aided detection. ; Targeted ultrasound examination of the  left breast was performed. COMPARISON:  Previous 2016 exams. ACR Breast Density Category c: The breasts are heterogeneously dense, which may obscure small masses. FINDINGS: Spot compression tomosynthesis views were obtained of the site of palpable concern in the LEFT breast. There is an irregular mass with associated architectural distortion noted subjacent to the site of palpable concern. There is an enlarged LEFT axillary lymph node noted in the LEFT axilla. No additional suspicious findings are noted. No suspicious mass, distortion, or microcalcifications are identified to suggest presence of malignancy in the RIGHT breast. Targeted ultrasound was performed of the site of palpable concern. At 10 o'clock 9 cm from the nipple, there is an irregular hypoechoic mass with irregular margins. It measures 10 x 8 x 12 mm. This corresponds to the irregular mass noted mammographically. Targeted ultrasound was performed of the LEFT axilla. There is an enlarged LEFT axillary lymph node with a cortical thickness of 7-8 mm. IMPRESSION: 1. There is a 12 mm mass at the site of palpable concern in the LEFT breast which is suspicious for malignancy. Recommend ultrasound-guided biopsy for definitive characterization. 2. There is an enlarged LEFT axillary lymph node. Recommend ultrasound-guided biopsy for definitive characterization. 3. No mammographic evidence of malignancy in the RIGHT breast. RECOMMENDATION: 1. LEFT breast ultrasound-guided biopsy x1 2. LEFT axillary ultrasound-guided biopsy x1 3. With malignant results, breast MRI with and without contrast would be recommended. I have discussed the findings and recommendations with the patient. The biopsy procedure was discussed with the patient and questions were answered. Patient expressed their understanding of the biopsy recommendation. Patient will be scheduled for biopsy at her earliest convenience by the schedulers. Ordering provider will be notified. If applicable, a  reminder letter will be sent to the patient regarding the next appointment. BI-RADS CATEGORY  5: Highly suggestive of malignancy. Electronically Signed   By: Meda Klinefelter M.D.   On: 05/25/2023 16:04   MM Outside Films Mammo  Result Date: 05/15/2023 This examination belongs  to an outside facility and is stored here for comparison purposes only.  Contact the originating outside institution for any associated report or interpretation.  MM Outside Films Mammo  Result Date: 05/15/2023 This examination belongs to an outside facility and is stored here for comparison purposes only.  Contact the originating outside institution for any associated report or interpretation.   Assessment and plan- Patient is a 52 y.o. female with history of newly diagnosed invasive mammary carcinoma with lobular features clinically prognostic stage Ib cT1 cN1 M0 ER/PR positive HER2 negative here to discuss further management  Discussed the results of mammogram and ultrasound with the patient in detail which showed a 12 mm mass 10 o'clock position 9 cm from the nipple in her left breast.  There was also concern for 1 enlarged left axillary lymph node measuring 7 to 8 mm cortical thickness.  Both these areas were positive for invasive mammary carcinoma.  Given that there were lobular features associated with it I would like to get an bilateral breast MRI to a certain the true extent of the disease.  I am referring her to Dr. Maia Plan for discussion regarding lumpectomy.  Given that her tumor is ER/PR positive HER2 negative I would recommend proceeding with upfront lumpectomy and sentinel lymph node biopsy and removal of the clipped lymph node.  Very likely patient is going to need Oncotype testing on her final pathology to determine if she would benefit from adjuvant chemotherapy or not.  Depending on the results of her final pathology I will decide if patient will need adjuvant chemotherapy regardless or if she needs Oncotype  testing to be sent.  Discussed what Oncotype testing is and how the results are interpreted.  Postlumpectomy patient would also benefit from adjuvant radiation treatment.  Also given the fact that her tumor is estrogen positive she would benefit from adjuvant endocrine therapy which I will discuss with her in more detail down the line.  Treatment will be given with a curative intent.  We are sending off blood work for genetic testing today patient has had a hysterectomy many years ago but still has ovaries in situ.  I am therefore getting estradiol and FSH levels checked today to confirm menopausal status.   Thank you for this kind referral and the opportunity to participate in the care of this patient   Visit Diagnosis 1. Invasive carcinoma of breast (HCC)     Dr. Owens Shark, MD, MPH [facility] [phone] 06/03/2023

## 2023-06-04 DIAGNOSIS — C50212 Malignant neoplasm of upper-inner quadrant of left female breast: Secondary | ICD-10-CM | POA: Diagnosis not present

## 2023-06-04 DIAGNOSIS — Z170 Estrogen receptor positive status [ER+]: Secondary | ICD-10-CM | POA: Diagnosis not present

## 2023-06-05 LAB — FSH/LH
FSH: 58.5 m[IU]/mL
LH: 20.3 m[IU]/mL

## 2023-06-05 LAB — ESTRADIOL: Estradiol: <5 pg/mL

## 2023-06-06 ENCOUNTER — Ambulatory Visit
Admission: RE | Admit: 2023-06-06 | Discharge: 2023-06-06 | Disposition: A | Discharge status: Home / Self Care | Payer: BC Managed Care – PPO | Source: Ambulatory Visit | Attending: Oncology | Admitting: Oncology

## 2023-06-06 DIAGNOSIS — C50919 Malignant neoplasm of unspecified site of unspecified female breast: Secondary | ICD-10-CM | POA: Insufficient documentation

## 2023-06-06 DIAGNOSIS — R590 Localized enlarged lymph nodes: Secondary | ICD-10-CM | POA: Diagnosis not present

## 2023-06-06 DIAGNOSIS — C50212 Malignant neoplasm of upper-inner quadrant of left female breast: Secondary | ICD-10-CM | POA: Diagnosis not present

## 2023-06-06 MED ORDER — GADOBUTROL 1 MMOL/ML IV SOLN
10.0000 mL | Freq: Once | INTRAVENOUS | Status: AC | PRN
  Administered 2023-06-06: 10 mL via INTRAVENOUS

## 2023-06-07 DIAGNOSIS — C50412 Malignant neoplasm of upper-outer quadrant of left female breast: Secondary | ICD-10-CM | POA: Insufficient documentation

## 2023-06-09 ENCOUNTER — Inpatient Hospital Stay: Payer: BC Managed Care – PPO | Admitting: Licensed Clinical Social Worker

## 2023-06-09 ENCOUNTER — Encounter: Payer: Self-pay | Admitting: Licensed Clinical Social Worker

## 2023-06-09 DIAGNOSIS — Z170 Estrogen receptor positive status [ER+]: Secondary | ICD-10-CM

## 2023-06-09 DIAGNOSIS — Z803 Family history of malignant neoplasm of breast: Secondary | ICD-10-CM | POA: Diagnosis not present

## 2023-06-09 DIAGNOSIS — C50412 Malignant neoplasm of upper-outer quadrant of left female breast: Secondary | ICD-10-CM

## 2023-06-09 NOTE — Progress Notes (Signed)
REFERRING PROVIDER: Creig Hines, MD [address] [place],  [place] [address]  PRIMARY PROVIDER:  Sallyanne Kuster, NP  PRIMARY REASON FOR VISIT:  1. Malignant neoplasm of upper-outer quadrant of left breast in female, estrogen receptor positive (HCC)   2. Family history of breast cancer    I connected with Audrey Ball on 06/09/2023 at 10:30 AM EDT by MyChart video conference and verified that I am speaking with the correct person using two identifiers.    Patient location: home Provider location: [facility]  HISTORY OF PRESENT ILLNESS:   Audrey Ball, a 52 y.o. female, was seen for a [facility] cancer genetics consultation at the request of Dr. Smith Robert due to a personal and family history of cancer.  Audrey Ball presents to clinic today to discuss the possibility of a hereditary predisposition to cancer, genetic testing, and to further clarify her future cancer risks, as well as potential cancer risks for family members.   CANCER HISTORY:  Oncology History  Malignant neoplasm of upper-outer quadrant of left breast in female, estrogen receptor positive (HCC)  06/07/2023 Initial Diagnosis   Malignant neoplasm of upper-outer quadrant of left breast in female, estrogen receptor positive (HCC)   06/07/2023 Cancer Staging   Staging form: Breast, AJCC 8th Edition - Clinical stage from 06/07/2023: Stage IB (cT1c, cN1, cM0, G2, ER+, PR+, HER2-) - Signed by Creig Hines, MD on 06/07/2023 Histologic grading system: 3 grade system    In 2024, at the age of 83, Audrey Ball was diagnosed with invasive mammary carcinoma of the left breast, ER/PR+ HER2-. The treatment plan currently includes bilateral breast MRI, upfront surgery, possible adjuvant chemotherapy, adjuvant radiation, adjuvant endocrine therapy.   RISK FACTORS:  Menarche was at age 54.  First live birth at age 29.  Ovaries intact: yes.  Hysterectomy: yes. Colonoscopy: yes; normal.  Past Medical History:   Diagnosis Date   Breast cancer (HCC)    Hypertension     Past Surgical History:  Procedure Laterality Date   BACK SURGERY  2017   BREAST BIOPSY Left 05/27/2023   u/s bx,mass 10:00, RIBBON clip-path pending   BREAST BIOPSY Left 05/27/2023   u/s bx, axilla/BUTTERFLY clippath pending   BREAST BIOPSY Left 05/27/2023   [place] LT BREAST BX W LOC DEV 1ST LESION IMG BX SPEC US GUIDE 05/27/2023 [facility]   COLONOSCOPY WITH PROPOFOL N/A 08/16/2021   Procedure: COLONOSCOPY WITH PROPOFOL;  Surgeon: Toney Reil, MD;  Location: [facility] ENDOSCOPY;  Service: Gastroenterology;  Laterality: N/A;   GALLBLADDER SURGERY  1999   TOTAL VAGINAL HYSTERECTOMY  2016   bilateral salpingectomy at [facility] for abnormal uterine bleeding    FAMILY HISTORY:  We obtained a detailed, 4-generation family history.  Significant diagnoses are listed below: Family History  Problem Relation Age of Onset   Leukemia Mother 52   Hypertension Mother    Diabetes Mother    Lung cancer Father 50   Throat cancer Brother        Currently in remission.   Lung cancer Maternal Grandmother        d.>50   Breast cancer Paternal Grandmother        dx 58s   Audrey Ball has 2 daughters, 6 and 34. She has 2 brothers. One brother had throat cancer and history of smoking.  Audrey Ball mother is living at 57 and had leukemia at 14. Maternal grandmother had lung cancer and passed over age 81.  Audrey Ball father passed of lung  cancer at 72. Paternal grandmother had breast cancer in her 15s and died of metastatic disease.  Audrey Ball is unaware of previous family history of genetic testing for hereditary cancer risks. There is no reported Ashkenazi Jewish ancestry. There is no known consanguinity.    GENETIC COUNSELING ASSESSMENT: Audrey Ball is a 52 y.o. female with a personal and family history of breast cancer which is somewhat suggestive of a hereditary cancer syndrome and predisposition to cancer. We,  therefore, discussed and recommended the following at today's visit.   DISCUSSION: We discussed that approximately 10% of breast cancer is hereditary. Most cases of hereditary breast cancer are associated with BRCA1/2 genes, although there are other genes associated with hereditary cancer as well. Cancers and risks are gene specific. We discussed that testing is beneficial for several reasons including knowing about cancer risks, identifying potential screening and risk-reduction options that may be appropriate, and to understand if other family members could be at risk for cancer and allow them to undergo genetic testing.   We reviewed the characteristics, features and inheritance patterns of hereditary cancer syndromes. We also discussed genetic testing, including the appropriate family members to test, the process of testing, insurance coverage and turn-around-time for results. We discussed the implications of a negative, positive and/or variant of uncertain significant result. We recommended Audrey Ball pursue genetic testing for the [place] BRCAPlus+CancerNext+RNA gene panel.   Based on Audrey Ball's personal and family history of cancer, she meets medical criteria for genetic testing. Despite that she meets criteria, she may still have an out of pocket cost.   PLAN: After considering the risks, benefits, and limitations, Audrey Ball provided informed consent to pursue genetic testing and the blood sample was sent to [facility] for analysis of the BRCAPlus+CancerNext+RNA panel. She had blood drawn 12/11. Results should be available within approximately 1 weeks' time, at which point they will be disclosed by telephone to Audrey Ball, as will any additional recommendations warranted by these results. Audrey Ball will receive a summary of her genetic counseling visit and a copy of her results once available. This information will also be available in Epic.   [name] questions were  answered to her satisfaction today. Our contact information was provided should additional questions or concerns arise. Thank you for the referral and allowing [place] to share in the care of your patient.   Lacy Duverney, MS, [facility] Genetic Counselor [email or web address] Phone: [phone]  The patient was seen for a total of 17 minutes in telephone genetic counseling.  Dr. Blake Divine was available for discussion regarding this case.   _______________________________________________________________________ For Office Staff:  Number of people involved in session: 1 Was an Intern/ student involved with case: no

## 2023-06-11 ENCOUNTER — Ambulatory Visit: Payer: Self-pay | Admitting: General Surgery

## 2023-06-11 ENCOUNTER — Other Ambulatory Visit: Payer: Self-pay | Admitting: General Surgery

## 2023-06-11 DIAGNOSIS — C50212 Malignant neoplasm of upper-inner quadrant of left female breast: Secondary | ICD-10-CM

## 2023-06-11 DIAGNOSIS — Z170 Estrogen receptor positive status [ER+]: Secondary | ICD-10-CM | POA: Diagnosis not present

## 2023-06-11 NOTE — H&P (View-Only) (Signed)
 HISTORY OF PRESENT ILLNESS:    Audrey Ball is a 52 y.o.female patient who comes for preoperative discussion of left breast cancer.  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  She was also found with abnormal lymph node that was biopsied showing metastatic breast cancer.  Due to lobular features, patient had a bilateral breast MRI.  Bilateral breast MRI shows a 3 cm upper inner quadrant mass of the left breast with again abnormal lymph node in the axillary area.  No other abnormal findings were identified on the ipsilateral or contralateral breast.  No other lymph nodes abnormalities were identified other than the one that was already biopsied.  I personally evaluated the images of the MRI.     PAST MEDICAL HISTORY:  Past Medical History:  Diagnosis Date   Arthritis    HTN (hypertension)    Osteoarthritis         PAST SURGICAL HISTORY:   Past Surgical History:  Procedure Laterality Date   robotic partial hysterectomy  2017   back surgery     CHOLECYSTECTOMY           MEDICATIONS:  Outpatient Encounter Medications as of 06/11/2023  Medication Sig Dispense Refill   betamethasone valerate (VALISONE) 0.1 % ointment Apply 1 Application topically as needed     cholecalciferol (VITAMIN D3) 1000 unit capsule Take 1,000 Units by mouth once daily     dextroamphetamine-amphetamine (ADDERALL) 20 mg tablet every morning     hydrochlorothiazide (HYDRODIURIL) 25 MG tablet Take 25 mg by mouth once daily.     meloxicam (MOBIC) 15 MG tablet Take 1 tablet by mouth once daily     traMADol (ULTRAM) 50 mg tablet Take 50 mg by mouth every 6 (six) hours as needed for Pain.     cyclobenzaprine (FLEXERIL) 10 MG tablet Take 1 tablet (10 mg total) by mouth 3 (three) times daily as needed for Muscle spasms. (Patient not taking: Reported on 06/11/2023) 60 tablet 0   ferrous sulfate 325 (65 FE) MG EC tablet Take 325 mg by mouth once daily. (Patient not  taking: Reported on 06/11/2023)     methocarbamol (ROBAXIN) 500 MG tablet Take 1 tablet (500 mg total) by mouth 4 (four) times daily. (Patient not taking: Reported on 06/11/2023) 60 tablet 0   naproxen (NAPROSYN) 500 MG tablet Take 1 tablet (500 mg total) by mouth 2 (two) times daily with meals. (Patient not taking: Reported on 06/11/2023) 30 tablet 0   naproxen (NAPROSYN) 500 MG tablet Take 1 tablet (500 mg total) by mouth 2 (two) times daily with meals. (Patient not taking: Reported on 06/11/2023) 30 tablet 0   predniSONE (DELTASONE) 10 MG tablet Take 1 tablet (10 mg total) by mouth once daily. 10 day taper, 5,5,4,4,3,3,2,2,1,1. (Patient not taking: Reported on 06/11/2023) 30 tablet 0   traMADol (ULTRAM) 50 mg tablet TAKE 1-2 TABLETS BY MOUTH EVERY SIX HOURS (Patient not taking: Reported on 06/11/2023) 30 tablet 0   valACYclovir (VALTREX) 1000 MG tablet  (Patient not taking: Reported on 06/11/2023)     No facility-administered encounter medications on file as of 06/11/2023.     ALLERGIES:   Amoxicillin, Penicillins, Propoxyphene n-acetaminophen, and Sulfa (sulfonamide antibiotics)   SOCIAL HISTORY:  Social History   Socioeconomic History   Marital status: Single  Tobacco Use   Smoking status: Never    Passive exposure: Never   Smokeless tobacco: Never  Vaping Use   Vaping status: Never Used  Substance and Sexual Activity   Alcohol use: Yes    Comment: socially   Drug use: No   Sexual activity: Defer   Social Drivers of Health   Financial Resource Strain: Low Risk  (06/04/2023)   Overall Financial Resource Strain (CARDIA)    Difficulty of Paying Living Expenses: Not hard at all  Food Insecurity: No Food Insecurity (06/04/2023)   Hunger Vital Sign    Worried About Running Out of Food in the Last Year: Never true    Ran Out of Food in the Last Year: Never true  Transportation Needs: No Transportation Needs (06/04/2023)   PRAPARE - Administrator, Civil Service  (Medical): No    Lack of Transportation (Non-Medical): No    FAMILY HISTORY:  Family History  Problem Relation Name Age of Onset   High blood pressure (Hypertension) Mother     Leukemia Mother     High blood pressure (Hypertension) Father     Lung cancer Father     Throat cancer Brother       GENERAL REVIEW OF SYSTEMS:   General ROS: negative for - chills, fatigue, fever, weight gain or weight loss Allergy and Immunology ROS: negative for - hives  Hematological and Lymphatic ROS: negative for - bleeding problems or bruising, negative for palpable nodes Endocrine ROS: negative for - heat or cold intolerance, hair changes Respiratory ROS: negative for - cough, shortness of breath or wheezing Cardiovascular ROS: no chest pain or palpitations GI ROS: negative for nausea, vomiting, abdominal pain, diarrhea, constipation Musculoskeletal ROS: negative for - joint swelling or muscle pain Neurological ROS: negative for - confusion, syncope Dermatological ROS: negative for pruritus and rash  PHYSICAL EXAM:  Vitals:   06/11/23 0722  BP: (!) 147/91  Pulse: 69  .  Ht:170.2 cm (5\' 7" ) Wt:99.8 kg (220 lb) KGM:WNUU surface area is 2.17 meters squared. Body mass index is 34.46 kg/m.Marland Kitchen   GENERAL: Alert, active, oriented x3  HEENT: Pupils equal reactive to light. Extraocular movements are intact. Sclera clear. Palpebral conjunctiva normal red color.Pharynx clear.  NECK: Supple with no palpable mass and no adenopathy.  LUNGS: Sound clear with no rales rhonchi or wheezes.  HEART: Regular rhythm S1 and S2 without murmur.  BREAST: Left breast palpable mass in the upper inner quadrant.  No nipple retraction, no skin involvement.  There is no palpable axillary adenopathy.  EXTREMITIES: Well-developed well-nourished symmetrical with no dependent edema.  NEUROLOGICAL: Awake alert oriented, facial expression symmetrical, moving all extremities.      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 -Today we discussed again the alternative of partial versus total mastectomy.  After long discussion between the 2 type of surgeries patient elected to proceed with partial mastectomy with targeted axillary lymph node dissection versus complete axillary dissection.  If the known positive lymph node is not one of the sentinel lymph node patient will need complete axillary node dissection.  If the known positive lymph node is one of the sentinel lymph node, we will try to take 2 additional lymph nodes to see if patient can avoid a complete axillary node dissection.     After surgery patient will need adjuvant radiation therapy.  Further studies of the final pathology will decide if patient needs chemotherapy.  Patient  will benefit of adjuvant endocrine therapy.  Patient will also need long-term breast cancer surveillance with [place] including diagnostic mammography is an very physical exam.    PLAN:  Left breast radiofrequency tagged partial mastectomy with left axillary sentinel lymph node biopsy vs axillary node dissection (19301, 38525, [id number]) Avoid taking aspirin or any blood thinner 5 days before surgery Contact us if you have any question or concern.   Patient and her husband verbalized understanding, all questions were answered, and were agreeable with the plan outlined above.   This was a 50-minute encounter mostly done counseling the patient and coordinating plan of care.  Carolan Shiver, MD  Electronically signed by Carolan Shiver, MD

## 2023-06-11 NOTE — H&P (Signed)
HISTORY OF PRESENT ILLNESS:    Audrey Ball is a 52 y.o.female patient who comes for preoperative discussion of left breast cancer.  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  She was also found with abnormal lymph node that was biopsied showing metastatic breast cancer.  Due to lobular features, patient had a bilateral breast MRI.  Bilateral breast MRI shows a 3 cm upper inner quadrant mass of the left breast with again abnormal lymph node in the axillary area.  No other abnormal findings were identified on the ipsilateral or contralateral breast.  No other lymph nodes abnormalities were identified other than the one that was already biopsied.  I personally evaluated the images of the MRI.     PAST MEDICAL HISTORY:  Past Medical History:  Diagnosis Date   Arthritis    HTN (hypertension)    Osteoarthritis         PAST SURGICAL HISTORY:   Past Surgical History:  Procedure Laterality Date   robotic partial hysterectomy  2017   back surgery     CHOLECYSTECTOMY           MEDICATIONS:  Outpatient Encounter Medications as of 06/11/2023  Medication Sig Dispense Refill   betamethasone valerate (VALISONE) 0.1 % ointment Apply 1 Application topically as needed     cholecalciferol (VITAMIN D3) 1000 unit capsule Take 1,000 Units by mouth once daily     dextroamphetamine-amphetamine (ADDERALL) 20 mg tablet every morning     hydrochlorothiazide (HYDRODIURIL) 25 MG tablet Take 25 mg by mouth once daily.     meloxicam (MOBIC) 15 MG tablet Take 1 tablet by mouth once daily     traMADol (ULTRAM) 50 mg tablet Take 50 mg by mouth every 6 (six) hours as needed for Pain.     cyclobenzaprine (FLEXERIL) 10 MG tablet Take 1 tablet (10 mg total) by mouth 3 (three) times daily as needed for Muscle spasms. (Patient not taking: Reported on 06/11/2023) 60 tablet 0   ferrous sulfate 325 (65 FE) MG EC tablet Take 325 mg by mouth once daily. (Patient not  taking: Reported on 06/11/2023)     methocarbamol (ROBAXIN) 500 MG tablet Take 1 tablet (500 mg total) by mouth 4 (four) times daily. (Patient not taking: Reported on 06/11/2023) 60 tablet 0   naproxen (NAPROSYN) 500 MG tablet Take 1 tablet (500 mg total) by mouth 2 (two) times daily with meals. (Patient not taking: Reported on 06/11/2023) 30 tablet 0   naproxen (NAPROSYN) 500 MG tablet Take 1 tablet (500 mg total) by mouth 2 (two) times daily with meals. (Patient not taking: Reported on 06/11/2023) 30 tablet 0   predniSONE (DELTASONE) 10 MG tablet Take 1 tablet (10 mg total) by mouth once daily. 10 day taper, 5,5,4,4,3,3,2,2,1,1. (Patient not taking: Reported on 06/11/2023) 30 tablet 0   traMADol (ULTRAM) 50 mg tablet TAKE 1-2 TABLETS BY MOUTH EVERY SIX HOURS (Patient not taking: Reported on 06/11/2023) 30 tablet 0   valACYclovir (VALTREX) 1000 MG tablet  (Patient not taking: Reported on 06/11/2023)     No facility-administered encounter medications on file as of 06/11/2023.     ALLERGIES:   Amoxicillin, Penicillins, Propoxyphene n-acetaminophen, and Sulfa (sulfonamide antibiotics)   SOCIAL HISTORY:  Social History   Socioeconomic History   Marital status: Single  Tobacco Use   Smoking status: Never    Passive exposure: Never   Smokeless tobacco: Never  Vaping Use   Vaping status: Never Used  Substance and Sexual Activity   Alcohol use: Yes    Comment: socially   Drug use: No   Sexual activity: Defer   Social Drivers of Health   Financial Resource Strain: Low Risk  (06/04/2023)   Overall Financial Resource Strain (CARDIA)    Difficulty of Paying Living Expenses: Not hard at all  Food Insecurity: No Food Insecurity (06/04/2023)   Hunger Vital Sign    Worried About Running Out of Food in the Last Year: Never true    Ran Out of Food in the Last Year: Never true  Transportation Needs: No Transportation Needs (06/04/2023)   PRAPARE - Administrator, Civil Service  (Medical): No    Lack of Transportation (Non-Medical): No    FAMILY HISTORY:  Family History  Problem Relation Name Age of Onset   High blood pressure (Hypertension) Mother     Leukemia Mother     High blood pressure (Hypertension) Father     Lung cancer Father     Throat cancer Brother       GENERAL REVIEW OF SYSTEMS:   General ROS: negative for - chills, fatigue, fever, weight gain or weight loss Allergy and Immunology ROS: negative for - hives  Hematological and Lymphatic ROS: negative for - bleeding problems or bruising, negative for palpable nodes Endocrine ROS: negative for - heat or cold intolerance, hair changes Respiratory ROS: negative for - cough, shortness of breath or wheezing Cardiovascular ROS: no chest pain or palpitations GI ROS: negative for nausea, vomiting, abdominal pain, diarrhea, constipation Musculoskeletal ROS: negative for - joint swelling or muscle pain Neurological ROS: negative for - confusion, syncope Dermatological ROS: negative for pruritus and rash  PHYSICAL EXAM:  Vitals:   06/11/23 0722  BP: (!) 147/91  Pulse: 69  .  Ht:170.2 cm (5\' 7" ) Wt:99.8 kg (220 lb) KGM:WNUU surface area is 2.17 meters squared. Body mass index is 34.46 kg/m.Marland Kitchen   GENERAL: Alert, active, oriented x3  HEENT: Pupils equal reactive to light. Extraocular movements are intact. Sclera clear. Palpebral conjunctiva normal red color.Pharynx clear.  NECK: Supple with no palpable mass and no adenopathy.  LUNGS: Sound clear with no rales rhonchi or wheezes.  HEART: Regular rhythm S1 and S2 without murmur.  BREAST: Left breast palpable mass in the upper inner quadrant.  No nipple retraction, no skin involvement.  There is no palpable axillary adenopathy.  EXTREMITIES: Well-developed well-nourished symmetrical with no dependent edema.  NEUROLOGICAL: Awake alert oriented, facial expression symmetrical, moving all extremities.      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 -Today we discussed again the alternative of partial versus total mastectomy.  After long discussion between the 2 type of surgeries patient elected to proceed with partial mastectomy with targeted axillary lymph node dissection versus complete axillary dissection.  If the known positive lymph node is not one of the sentinel lymph node patient will need complete axillary node dissection.  If the known positive lymph node is one of the sentinel lymph node, we will try to take 2 additional lymph nodes to see if patient can avoid a complete axillary node dissection.     After surgery patient will need adjuvant radiation therapy.  Further studies of the final pathology will decide if patient needs chemotherapy.  Patient  will benefit of adjuvant endocrine therapy.  Patient will also need long-term breast cancer surveillance with [place] including diagnostic mammography is an very physical exam.    PLAN:  Left breast radiofrequency tagged partial mastectomy with left axillary sentinel lymph node biopsy vs axillary node dissection (19301, [id number], [id number]) Avoid taking aspirin or any blood thinner 5 days before surgery Contact us if you have any question or concern.   Patient and her husband verbalized understanding, all questions were answered, and were agreeable with the plan outlined above.   This was a 50-minute encounter mostly done counseling the patient and coordinating plan of care.  Carolan Shiver, MD  Electronically signed by Carolan Shiver, MD

## 2023-06-12 ENCOUNTER — Encounter
Admission: RE | Admit: 2023-06-12 | Discharge: 2023-06-12 | Disposition: A | Discharge status: Home / Self Care | Payer: BC Managed Care – PPO | Source: Ambulatory Visit | Attending: General Surgery | Admitting: General Surgery

## 2023-06-12 ENCOUNTER — Other Ambulatory Visit: Payer: Self-pay | Admitting: General Surgery

## 2023-06-12 ENCOUNTER — Telehealth: Payer: Self-pay | Admitting: Licensed Clinical Social Worker

## 2023-06-12 ENCOUNTER — Other Ambulatory Visit: Payer: Self-pay

## 2023-06-12 VITALS — Ht 67.0 in | Wt 220.0 lb

## 2023-06-12 DIAGNOSIS — C50212 Malignant neoplasm of upper-inner quadrant of left female breast: Secondary | ICD-10-CM

## 2023-06-12 DIAGNOSIS — I10 Essential (primary) hypertension: Secondary | ICD-10-CM

## 2023-06-12 NOTE — Telephone Encounter (Signed)
I contacted Audrey Ball to discuss her genetic testing results. No pathogenic variants were identified in the 13 genes analyzed on the BRCAPlus STAT Hereditary Breast Cancer Panel.  Remainder of testing is still pending. Detailed clinic note to follow.   The test report has been scanned into EPIC and is located under the Molecular Pathology section of the Results Review tab.  A portion of the result report is included below for reference.      Lacy Duverney, MS, [facility] Genetic Counselor [email or web address] Phone: [phone]

## 2023-06-12 NOTE — Patient Instructions (Signed)
Your procedure is scheduled on: Monday 06/22/23 To find out your arrival time, please call [phone] between 1PM - 3PM on:  Friday 06/19/23  Report to the Registration Desk on the 1st floor of the [facility]. FREE Valet parking is available.  If your arrival time is 6:00 am, do not arrive before that time as the [facility] entrance doors do not open until 6:00 am.  REMEMBER: Instructions that are not followed completely may result in serious medical risk, up to and including death; or upon the discretion of your surgeon and anesthesiologist your surgery may need to be rescheduled.  Do not eat food or drink any liquids after midnight the night before surgery.  No gum chewing or hard candies.  One week prior to surgery: Stop Anti-inflammatories (NSAIDS) such as Advil, Aleve, Ibuprofen, Motrin, Naproxen, Naprosyn and Aspirin based products such as Excedrin, Goody's Powder, BC Powder. Stop the Meloxicam 1 wee before surgery. You may however, continue to take Tylenol if needed for pain up until the day of surgery.  Stop ANY OVER THE COUNTER supplements and vitamins for 7 days until after surgery.  Continue taking all prescribed medications with the exception of the following: Meloxicam  TAKE ONLY THESE MEDICATIONS THE MORNING OF SURGERY WITH A SIP OF WATER:  none  No Alcohol for 24 hours before or after surgery.  No Smoking including e-cigarettes for 24 hours before surgery.  No chewable tobacco products for at least 6 hours before surgery.  No nicotine patches on the day of surgery.  Do not use any "recreational" drugs for at least a week (preferably 2 weeks) before your surgery.  Please be advised that the combination of cocaine and anesthesia may have negative outcomes, up to and including death. If you test positive for cocaine, your surgery will be cancelled.  On the morning of surgery brush your teeth with toothpaste and water, you may rinse your mouth with mouthwash if  you wish. Do not swallow any toothpaste or mouthwash.  Use CHG Soap or wipes as directed on instruction sheet.  Do not wear lotions, powders, or perfumes. NO DEODORANT  Do not shave body hair from the neck down 48 hours before surgery.  Wear clean comfortable clothing (specific to your surgery type) to the hospital.  Do not wear jewelry, make-up, hairpins, clips or nail polish.  For welded (permanent) jewelry: bracelets, anklets, waist bands, etc.  Please have this removed prior to surgery.  If it is not removed, there is a chance that hospital personnel will need to cut it off on the day of surgery. Contact lenses, hearing aids and dentures may not be worn into surgery.  Do not bring valuables to the hospital. [facility] is not responsible for any missing/lost belongings or valuables.   Notify your doctor if there is any change in your medical condition (cold, fever, infection).  If you are being discharged the day of surgery, you will not be allowed to drive home. You will need a responsible individual to drive you home and stay with you for 24 hours after surgery.   If you are taking public transportation, you will need to have a responsible individual with you.  If you are being admitted to the hospital overnight, leave your suitcase in the car. After surgery it may be brought to your room.  In case of increased patient census, it may be necessary for you, the patient, to continue your postoperative care in the Same Day Surgery department.  After surgery, you can help prevent lung complications by doing breathing exercises.  Take deep breaths and cough every 1-2 hours. Your doctor may order a device called an Incentive Spirometer to help you take deep breaths. When coughing or sneezing, hold a pillow firmly against your incision with both hands. This is called "splinting." Doing this helps protect your incision. It also decreases belly discomfort.  Surgery Visitation  Policy:  Patients undergoing a surgery or procedure may have two family members or support persons with them as long as the person is not COVID-19 positive or experiencing its symptoms.   Inpatient Visitation:    Visiting hours are 7 a.m. to 8 p.m. Up to four visitors are allowed at one time in a patient room. The visitors may rotate out with other people during the day. One designated support person (adult) may remain overnight.  Please call the Pre-admissions Testing Dept. at [phone] if you have any questions about these instructions.     Preparing for Surgery with CHLORHEXIDINE GLUCONATE (CHG) Soap  Chlorhexidine Gluconate (CHG) Soap  o An antiseptic cleaner that kills germs and bonds with the skin to continue killing germs even after washing  o Used for showering the night before surgery and morning of surgery  Before surgery, you can play an important role by reducing the number of germs on your skin.  CHG (Chlorhexidine gluconate) soap is an antiseptic cleanser which kills germs and bonds with the skin to continue killing germs even after washing.  Please do not use if you have an allergy to CHG or antibacterial soaps. If your skin becomes reddened/irritated stop using the CHG.  1. Shower the NIGHT BEFORE SURGERY and the MORNING OF SURGERY with CHG soap.  2. If you choose to wash your hair, wash your hair first as usual with your normal shampoo.  3. After shampooing, rinse your hair and body thoroughly to remove the shampoo.  4. Use CHG as you would any other liquid soap. You can apply CHG directly to the skin and wash gently with a scrungie or a clean washcloth.  5. Apply the CHG soap to your body only from the neck down. Do not use on open wounds or open sores. Avoid contact with your eyes, ears, mouth, and genitals (private parts). Wash face and genitals (private parts) with your normal soap.  6. Wash thoroughly, paying special attention to the area where your  surgery will be performed.  7. Thoroughly rinse your body with warm water.  8. Do not shower/wash with your normal soap after using and rinsing off the CHG soap.  9. Pat yourself dry with a clean towel.  10. Wear clean pajamas to bed the night before surgery.  12. Place clean sheets on your bed the night of your first shower and do not sleep with pets.  13. Shower again with the CHG soap on the day of surgery prior to arriving at the hospital.  14. Do not apply any deodorants/lotions/powders.  15. Please wear clean clothes to the hospital.

## 2023-06-15 ENCOUNTER — Ambulatory Visit
Admission: RE | Admit: 2023-06-15 | Discharge: 2023-06-15 | Disposition: A | Discharge status: Home / Self Care | Payer: BC Managed Care – PPO | Source: Ambulatory Visit | Attending: General Surgery | Admitting: General Surgery

## 2023-06-15 ENCOUNTER — Encounter
Admission: RE | Admit: 2023-06-15 | Discharge: 2023-06-15 | Disposition: A | Discharge status: Home / Self Care | Payer: BC Managed Care – PPO | Source: Ambulatory Visit | Attending: General Surgery | Admitting: General Surgery

## 2023-06-15 DIAGNOSIS — C50212 Malignant neoplasm of upper-inner quadrant of left female breast: Secondary | ICD-10-CM

## 2023-06-15 DIAGNOSIS — Z01810 Encounter for preprocedural cardiovascular examination: Secondary | ICD-10-CM | POA: Diagnosis not present

## 2023-06-15 DIAGNOSIS — I10 Essential (primary) hypertension: Secondary | ICD-10-CM | POA: Diagnosis not present

## 2023-06-15 DIAGNOSIS — N6322 Unspecified lump in the left breast, upper inner quadrant: Secondary | ICD-10-CM | POA: Diagnosis not present

## 2023-06-15 DIAGNOSIS — R599 Enlarged lymph nodes, unspecified: Secondary | ICD-10-CM | POA: Diagnosis not present

## 2023-06-15 HISTORY — PX: BREAST BIOPSY: SHX20

## 2023-06-15 LAB — CBC
HCT: 41.3 % (ref 36.0–46.0)
Hemoglobin: 13.7 g/dL (ref 12.0–15.0)
MCH: 27.8 pg (ref 26.0–34.0)
MCHC: 33.2 g/dL (ref 30.0–36.0)
MCV: 83.8 fL (ref 80.0–100.0)
Platelets: 284 10*3/uL (ref 150–400)
RBC: 4.93 MIL/uL (ref 3.87–5.11)
RDW: 14.3 % (ref 11.5–15.5)
WBC: 5.7 10*3/uL (ref 4.0–10.5)
nRBC: 0 % (ref 0.0–0.2)

## 2023-06-15 MED ORDER — LIDOCAINE HCL 1 % IJ SOLN
5.0000 mL | Freq: Once | INTRAMUSCULAR | Status: AC
  Administered 2023-06-15: 5 mL
  Filled 2023-06-15: qty 5

## 2023-06-22 ENCOUNTER — Encounter: Payer: BC Managed Care – PPO | Admitting: Licensed Clinical Social Worker

## 2023-06-22 ENCOUNTER — Other Ambulatory Visit: Payer: Self-pay

## 2023-06-22 ENCOUNTER — Ambulatory Visit: Payer: BC Managed Care – PPO

## 2023-06-22 ENCOUNTER — Encounter: Payer: Self-pay | Admitting: General Surgery

## 2023-06-22 ENCOUNTER — Encounter
Admission: RE | Admit: 2023-06-22 | Discharge: 2023-06-22 | Disposition: A | Discharge status: Home / Self Care | Payer: BC Managed Care – PPO | Source: Ambulatory Visit | Attending: General Surgery

## 2023-06-22 ENCOUNTER — Ambulatory Visit
Admission: RE | Admit: 2023-06-22 | Discharge: 2023-06-22 | Disposition: A | Discharge status: Home / Self Care | Payer: BC Managed Care – PPO | Attending: General Surgery | Admitting: General Surgery

## 2023-06-22 ENCOUNTER — Encounter
Admission: RE | Disposition: A | Discharge status: Home / Self Care | Payer: Self-pay | Source: Home / Self Care | Attending: General Surgery

## 2023-06-22 ENCOUNTER — Ambulatory Visit
Admission: RE | Admit: 2023-06-22 | Discharge: 2023-06-22 | Disposition: A | Discharge status: Home / Self Care | Payer: BC Managed Care – PPO | Source: Home / Self Care | Attending: General Surgery | Admitting: General Surgery

## 2023-06-22 DIAGNOSIS — C50212 Malignant neoplasm of upper-inner quadrant of left female breast: Secondary | ICD-10-CM | POA: Diagnosis not present

## 2023-06-22 DIAGNOSIS — Z170 Estrogen receptor positive status [ER+]: Secondary | ICD-10-CM

## 2023-06-22 DIAGNOSIS — C773 Secondary and unspecified malignant neoplasm of axilla and upper limb lymph nodes: Secondary | ICD-10-CM | POA: Insufficient documentation

## 2023-06-22 DIAGNOSIS — Z806 Family history of leukemia: Secondary | ICD-10-CM | POA: Diagnosis not present

## 2023-06-22 DIAGNOSIS — Z8249 Family history of ischemic heart disease and other diseases of the circulatory system: Secondary | ICD-10-CM | POA: Diagnosis not present

## 2023-06-22 DIAGNOSIS — I10 Essential (primary) hypertension: Secondary | ICD-10-CM | POA: Diagnosis not present

## 2023-06-22 DIAGNOSIS — C50912 Malignant neoplasm of unspecified site of left female breast: Secondary | ICD-10-CM | POA: Diagnosis not present

## 2023-06-22 DIAGNOSIS — Z801 Family history of malignant neoplasm of trachea, bronchus and lung: Secondary | ICD-10-CM | POA: Insufficient documentation

## 2023-06-22 HISTORY — PX: PART MASTECTOMY,RADIO FREQUENCY LOCALIZER,AXILLARY SENTINEL NODE BIOPSY: SHX6901

## 2023-06-22 SURGERY — PART MASTECTOMY,RADIO FREQUENCY LOCALIZER,AXILLARY SENTINEL NODE BIOPSY
Anesthesia: General | Site: Breast | Laterality: Left

## 2023-06-22 MED ORDER — HYDROMORPHONE HCL 1 MG/ML IJ SOLN
INTRAMUSCULAR | Status: AC
  Filled 2023-06-22: qty 1

## 2023-06-22 MED ORDER — OXYCODONE HCL 5 MG PO TABS
ORAL_TABLET | ORAL | Status: AC
  Filled 2023-06-22: qty 1

## 2023-06-22 MED ORDER — PROPOFOL 10 MG/ML IV BOLUS
INTRAVENOUS | Status: AC
  Filled 2023-06-22: qty 20

## 2023-06-22 MED ORDER — BUPIVACAINE-EPINEPHRINE (PF) 0.5% -1:200000 IJ SOLN
INTRAMUSCULAR | Status: DC | PRN
  Administered 2023-06-22: 30 mL

## 2023-06-22 MED ORDER — PROPOFOL 10 MG/ML IV BOLUS
INTRAVENOUS | Status: AC
  Filled 2023-06-22: qty 40

## 2023-06-22 MED ORDER — TECHNETIUM TC 99M TILMANOCEPT KIT
1.0600 | PACK | Freq: Once | INTRAVENOUS | Status: AC | PRN
  Administered 2023-06-22: 1.06 via INTRADERMAL

## 2023-06-22 MED ORDER — MIDAZOLAM HCL 2 MG/2ML IJ SOLN
INTRAMUSCULAR | Status: DC | PRN
  Administered 2023-06-22: 2 mg via INTRAVENOUS

## 2023-06-22 MED ORDER — FENTANYL CITRATE (PF) 100 MCG/2ML IJ SOLN
INTRAMUSCULAR | Status: AC
  Filled 2023-06-22: qty 2

## 2023-06-22 MED ORDER — ACETAMINOPHEN 10 MG/ML IV SOLN
1000.0000 mg | Freq: Once | INTRAVENOUS | Status: AC
  Administered 2023-06-22: 1000 mg via INTRAVENOUS

## 2023-06-22 MED ORDER — DEXAMETHASONE SODIUM PHOSPHATE 10 MG/ML IJ SOLN
INTRAMUSCULAR | Status: DC | PRN
  Administered 2023-06-22: 10 mg via INTRAVENOUS

## 2023-06-22 MED ORDER — OXYCODONE HCL 5 MG/5ML PO SOLN: 5.0000 mg | Freq: Once | ORAL | Status: AC | PRN

## 2023-06-22 MED ORDER — METHYLENE BLUE (ANTIDOTE) 1 % IV SOLN
INTRAVENOUS | Status: DC | PRN
  Administered 2023-06-22: 10 mL

## 2023-06-22 MED ORDER — SUCCINYLCHOLINE CHLORIDE 200 MG/10ML IV SOSY
PREFILLED_SYRINGE | INTRAVENOUS | Status: AC
  Filled 2023-06-22: qty 10

## 2023-06-22 MED ORDER — BUPIVACAINE-EPINEPHRINE (PF) 0.5% -1:200000 IJ SOLN
INTRAMUSCULAR | Status: AC
  Filled 2023-06-22: qty 30

## 2023-06-22 MED ORDER — HYDROMORPHONE HCL 1 MG/ML IJ SOLN
0.2500 mg | INTRAMUSCULAR | Status: DC | PRN
  Administered 2023-06-22: 0.25 mg via INTRAVENOUS
  Administered 2023-06-22 (×2): 0.5 mg via INTRAVENOUS
  Administered 2023-06-22: 0.25 mg via INTRAVENOUS
  Administered 2023-06-22: 0.5 mg via INTRAVENOUS

## 2023-06-22 MED ORDER — STERILE WATER FOR IRRIGATION IR SOLN
Status: DC | PRN
  Administered 2023-06-22: 1

## 2023-06-22 MED ORDER — LIDOCAINE HCL (PF) 2 % IJ SOLN
INTRAMUSCULAR | Status: AC
  Filled 2023-06-22: qty 5

## 2023-06-22 MED ORDER — PROPOFOL 10 MG/ML IV BOLUS
INTRAVENOUS | Status: DC | PRN
  Administered 2023-06-22 (×2): 150 ug/kg/min via INTRAVENOUS

## 2023-06-22 MED ORDER — CHLORHEXIDINE GLUCONATE 0.12 % MT SOLN
15.0000 mL | Freq: Once | OROMUCOSAL | Status: AC
  Administered 2023-06-22: 15 mL via OROMUCOSAL

## 2023-06-22 MED ORDER — CHLORHEXIDINE GLUCONATE 0.12 % MT SOLN
OROMUCOSAL | Status: AC
  Filled 2023-06-22: qty 15

## 2023-06-22 MED ORDER — ONDANSETRON HCL 4 MG/2ML IJ SOLN
INTRAMUSCULAR | Status: DC | PRN
  Administered 2023-06-22: 4 mg via INTRAVENOUS

## 2023-06-22 MED ORDER — METHYLENE BLUE (ANTIDOTE) 1 % IV SOLN
INTRAVENOUS | Status: AC
  Filled 2023-06-22: qty 10

## 2023-06-22 MED ORDER — OXYCODONE-ACETAMINOPHEN 5-325 MG PO TABS: 1.0000 | ORAL_TABLET | ORAL | 0 refills | Status: DC | PRN

## 2023-06-22 MED ORDER — DEXAMETHASONE SODIUM PHOSPHATE 10 MG/ML IJ SOLN
INTRAMUSCULAR | Status: AC
  Filled 2023-06-22: qty 1

## 2023-06-22 MED ORDER — CEFAZOLIN SODIUM-DEXTROSE 2-4 GM/100ML-% IV SOLN
INTRAVENOUS | Status: AC
  Filled 2023-06-22: qty 100

## 2023-06-22 MED ORDER — ONDANSETRON HCL 4 MG/2ML IJ SOLN
INTRAMUSCULAR | Status: AC
  Filled 2023-06-22: qty 2

## 2023-06-22 MED ORDER — KETOROLAC TROMETHAMINE 30 MG/ML IJ SOLN
INTRAMUSCULAR | Status: DC | PRN
  Administered 2023-06-22: 30 mg via INTRAVENOUS

## 2023-06-22 MED ORDER — ACETAMINOPHEN 10 MG/ML IV SOLN
INTRAVENOUS | Status: AC
  Filled 2023-06-22: qty 100

## 2023-06-22 MED ORDER — ORAL CARE MOUTH RINSE: 15.0000 mL | Freq: Once | OROMUCOSAL | Status: AC

## 2023-06-22 MED ORDER — OXYCODONE HCL 5 MG PO TABS
5.0000 mg | ORAL_TABLET | Freq: Once | ORAL | Status: AC | PRN
  Administered 2023-06-22: 5 mg via ORAL

## 2023-06-22 MED ORDER — CEFAZOLIN SODIUM-DEXTROSE 2-4 GM/100ML-% IV SOLN
2.0000 g | INTRAVENOUS | Status: AC
  Administered 2023-06-22: 2 g via INTRAVENOUS

## 2023-06-22 MED ORDER — LIDOCAINE HCL (CARDIAC) PF 100 MG/5ML IV SOSY
PREFILLED_SYRINGE | INTRAVENOUS | Status: DC | PRN
  Administered 2023-06-22: 100 mg via INTRAVENOUS

## 2023-06-22 MED ORDER — FENTANYL CITRATE (PF) 100 MCG/2ML IJ SOLN
INTRAMUSCULAR | Status: DC | PRN
  Administered 2023-06-22 (×2): 50 ug via INTRAVENOUS
  Administered 2023-06-22 (×2): 25 ug via INTRAVENOUS

## 2023-06-22 MED ORDER — MIDAZOLAM HCL 2 MG/2ML IJ SOLN
INTRAMUSCULAR | Status: AC
  Filled 2023-06-22: qty 2

## 2023-06-22 MED ORDER — LACTATED RINGERS IV SOLN: INTRAVENOUS | Status: DC

## 2023-06-22 SURGICAL SUPPLY — 37 items
BLADE SURG 15 STRL LF DISP TIS (BLADE) ×4 IMPLANT
CHLORAPREP W/TINT 26 (MISCELLANEOUS) ×2 IMPLANT
CNTNR URN SCR LID CUP LEK RST (MISCELLANEOUS) IMPLANT
COVER PROBE GAMMA FINDER SLV (MISCELLANEOUS) ×2 IMPLANT
DERMABOND ADVANCED .7 DNX12 (GAUZE/BANDAGES/DRESSINGS) ×2 IMPLANT
DEVICE DUBIN SPECIMEN MAMMOGRA (MISCELLANEOUS) ×2 IMPLANT
DRAPE LAPAROTOMY TRNSV 106X77 (MISCELLANEOUS) ×2 IMPLANT
DRSG GAUZE FLUFF 36X18 (GAUZE/BANDAGES/DRESSINGS) IMPLANT
ELECT CAUTERY BLADE 6.4 (BLADE) ×2 IMPLANT
ELECT REM PT RETURN 9FT ADLT (ELECTROSURGICAL) ×1
ELECTRODE REM PT RTRN 9FT ADLT (ELECTROSURGICAL) ×2 IMPLANT
GAUZE 4X4 16PLY ~~LOC~~+RFID DBL (SPONGE) ×2 IMPLANT
GLOVE BIO SURGEON STRL SZ 6.5 (GLOVE) ×2 IMPLANT
GLOVE BIOGEL PI IND STRL 6.5 (GLOVE) ×2 IMPLANT
GOWN STRL REUS W/ TWL LRG LVL3 (GOWN DISPOSABLE) ×4 IMPLANT
KIT MARKER MARGIN INK (KITS) IMPLANT
KIT TURNOVER KIT A (KITS) ×2 IMPLANT
LABEL OR SOLS (LABEL) ×2 IMPLANT
MANIFOLD NEPTUNE II (INSTRUMENTS) ×2 IMPLANT
MARKER MARGIN CORRECT CLIP (MARKER) IMPLANT
NDL HYPO 22X1.5 SAFETY MO (MISCELLANEOUS) ×2 IMPLANT
NDL HYPO 25X1 1.5 SAFETY (NEEDLE) ×2 IMPLANT
NEEDLE HYPO 22X1.5 SAFETY MO (MISCELLANEOUS) ×1
NEEDLE HYPO 25X1 1.5 SAFETY (NEEDLE) ×1
PACK BASIN MINOR ARMC (MISCELLANEOUS) ×2 IMPLANT
RETRACTOR RING XSMALL (MISCELLANEOUS) IMPLANT
RTRCTR WOUND ALEXIS 13CM XS SH (MISCELLANEOUS) ×1
SHEATH BREAST BIOPSY SKIN MKR (SHEATH) ×2 IMPLANT
SUT MNCRL 4-0 27XMFL (SUTURE) ×1
SUT SILK 2 0 SH (SUTURE) ×2 IMPLANT
SUT VIC AB 3-0 SH 27X BRD (SUTURE) ×4 IMPLANT
SUTURE MNCRL 4-0 27XMF (SUTURE) ×4 IMPLANT
SYR 10ML LL (SYRINGE) ×4 IMPLANT
TRAP FLUID SMOKE EVACUATOR (MISCELLANEOUS) ×2 IMPLANT
TRAP NEPTUNE SPECIMEN COLLECT (MISCELLANEOUS) ×2 IMPLANT
WATER STERILE IRR 1000ML POUR (IV SOLUTION) ×2 IMPLANT
WATER STERILE IRR 500ML POUR (IV SOLUTION) ×2 IMPLANT

## 2023-06-22 NOTE — Transfer of Care (Addendum)
Immediate Anesthesia Transfer of Care Note  Patient: Audrey Ball  Procedure(s) Performed: PART MASTECTOMY,RADIO FREQUENCY LOCALIZER,AXILLARY SENTINEL NODE BIOPSY (Left: Breast)  Patient Location: PACU  Anesthesia Type:General  Level of Consciousness: drowsy  Airway & Oxygen Therapy: Patient Spontanous Breathing  Post-op Assessment: Report given to RN and Post -op Vital signs reviewed and stable  Post vital signs: Reviewed and stable  Last Vitals:  Vitals Value Taken Time  BP 152/80 06/22/23 1240  Temp    Pulse 97 06/22/23 1245  Resp 18 06/22/23 1245  SpO2 98 % 06/22/23 1245  Vitals shown include unfiled device data.  Last Pain:  Vitals:   06/22/23 0859  TempSrc: Temporal  PainSc: 6          Complications: There were no known notable events for this encounter.

## 2023-06-22 NOTE — Progress Notes (Signed)
Patient awake/alert x4.  Medicated as ordered.  Patient in bed, tolerated gingerale and crackers without event. Reviewed procedure and pain medication , verbalizes understanding.

## 2023-06-22 NOTE — Interval H&P Note (Signed)
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  The various methods of treatment have been discussed with the patient and family. After consideration of risks, benefits and other options for treatment, the patient has consented to  Procedure(s): PART MASTECTOMY,RADIO FREQUENCY LOCALIZER,AXILLARY SENTINEL NODE BIOPSY (Left) as a surgical intervention.  The patient's history has been reviewed, patient examined, no change in status, stable for surgery.  I have reviewed the patient's chart and labs.  Questions were answered to the patient's satisfaction.     Carolan Shiver

## 2023-06-22 NOTE — Discharge Instructions (Addendum)

## 2023-06-22 NOTE — Anesthesia Procedure Notes (Signed)
Procedure Name: LMA Insertion Date/Time: 06/22/2023 9:55 AM  Performed by: Lysbeth Penner, CRNAPre-anesthesia Checklist: Patient identified, Emergency Drugs available, Suction available, Patient being monitored and Timeout performed Patient Re-evaluated:Patient Re-evaluated prior to induction Oxygen Delivery Method: Circle system utilized Preoxygenation: Pre-oxygenation with 100% oxygen Induction Type: IV induction LMA: LMA inserted LMA Size: 3.0 Number of attempts: 1 Tube secured with: Tape Dental Injury: Teeth and Oropharynx as per pre-operative assessment

## 2023-06-22 NOTE — Anesthesia Preprocedure Evaluation (Signed)
Anesthesia Evaluation  Patient identified by MRN, date of birth, ID band Patient awake    Reviewed: Allergy & Precautions, NPO status , Patient's Chart, lab work & pertinent test results  History of Anesthesia Complications Negative for: history of anesthetic complications  Airway Mallampati: III  TM Distance: >3 FB Neck ROM: full    Dental no notable dental hx.    Pulmonary neg pulmonary ROS   Pulmonary exam normal        Cardiovascular hypertension, On Medications negative cardio ROS Normal cardiovascular exam     Neuro/Psych  Neuromuscular disease  negative psych ROS   GI/Hepatic negative GI ROS, Neg liver ROS,,,  Endo/Other  negative endocrine ROS    Renal/GU      Musculoskeletal   Abdominal   Peds  Hematology negative hematology ROS (+)   Anesthesia Other Findings Past Medical History: No date: Breast cancer (HCC) No date: Hypertension  Past Surgical History: 2017: BACK SURGERY 05/27/2023: BREAST BIOPSY; Left     Comment:  u/s bx,mass 10:00, RIBBON clip-path pending 05/27/2023: BREAST BIOPSY; Left     Comment:  u/s bx, axilla/BUTTERFLY clippath pending 05/27/2023: BREAST BIOPSY; Left     Comment:  [place] LT BREAST BX W LOC DEV 1ST LESION IMG BX SPEC [place]               GUIDE 05/27/2023 [facility] 06/15/2023: BREAST BIOPSY; Left     Comment:  [place] LT RADIO FREQUENCY TAG LOC US GUIDE 06/15/2023               [facility] 06/15/2023: BREAST BIOPSY; Left     Comment:  [place] LT RADIO FREQUENCY TAG EA ADD LESION LOC US GUIDE               06/15/2023 [facility] 08/16/2021: COLONOSCOPY WITH PROPOFOL; N/A     Comment:  Procedure: COLONOSCOPY WITH PROPOFOL;  Surgeon: Toney Reil, MD;  Location: [facility] ENDOSCOPY;  Service:               Gastroenterology;  Laterality: N/A; 1999: GALLBLADDER SURGERY 2016: TOTAL VAGINAL HYSTERECTOMY     Comment:  bilateral salpingectomy at [facility] for  abnormal uterine               bleeding     Reproductive/Obstetrics negative OB ROS                             Anesthesia Physical Anesthesia Plan  ASA: 2  Anesthesia Plan: General LMA   Post-op Pain Management: Ofirmev IV (intra-op)*, Toradol IV (intra-op)* and Ketamine IV*   Induction: Intravenous  PONV Risk Score and Plan: 3 and Dexamethasone, Ondansetron, Midazolam and Treatment may vary due to age or medical condition  Airway Management Planned: LMA  Additional Equipment:   Intra-op Plan:   Post-operative Plan: Extubation in OR  Informed Consent: I have reviewed the patients History and Physical, chart, labs and discussed the procedure including the risks, benefits and alternatives for the proposed anesthesia with the patient or authorized representative who has indicated his/her understanding and acceptance.     Dental Advisory Given  Plan Discussed with: Anesthesiologist, CRNA and Surgeon  Anesthesia Plan Comments: (Patient consented for risks of anesthesia including but not limited to:  - adverse reactions to medications - damage to eyes, teeth, lips or other oral mucosa - nerve damage due to positioning  -  sore throat or hoarseness - Damage to heart, brain, nerves, lungs, other parts of body or loss of life  Patient voiced understanding and assent.)        Anesthesia Quick Evaluation

## 2023-06-22 NOTE — Op Note (Signed)
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  Postoperative diagnosis: Same.   Procedure: Left radiofrequency tag-localized partial mastectomy.                      Left axillary targeted axillary node dissection                                         Left Axillary Sentinel Lymph node biopsy  Anesthesia: GETA  Surgeon: Dr. Hazle Quant  Wound Classification: Clean  Indications: Patient is a 52 y.o. female with a nonpalpable left breast mass noted on mammography with core biopsy demonstrating invasive lobular carcinoma requires radiofrequency tag-localized partial mastectomy for treatment with targeted axillary lymph node dissection of positive lymph node plus sentinel lymph node biopsy.   Findings: 1. Specimen mammography shows marker and tag on specimen 2. Pathology call refers gross examination of margins was grossly negative 3.  The previously biopsied positive lymph node was the sentinel node #1.  2 additional sentinel lymph nodes identified using technetium 99 and methylene blue  Description of procedure: Preoperative radiofrequency tag localization was performed by radiology. In the nuclear medicine suite, the subareolar region was injected with Tc-99 sulfur colloid. Localization studies were reviewed. The patient was taken to the operating room and placed supine on the operating table, and after general anesthesia the left chest and axilla were prepped and draped in the usual sterile fashion. A time-out was completed verifying correct patient, procedure, site, positioning, and implant(s) and/or special equipment prior to beginning this procedure.  Methylene blue was injected intradermally for intraoperative identification of sentinel lymph nodes. By comparing the localization studies and interrogation with [facility] device, the probable trajectory and location of the mass was visualized. A circumareolar skin incision was planned in such a way  as to minimize the amount of dissection to reach the mass.  The skin incision was made. Flaps were raised and the location of the tag was confirmed with [facility] device confirmed. A 2-0 silk figure-of-eight stay suture was placed and used for retraction. Dissection was then taken down circumferentially, taking care to include the entire localizing tag and a wide margin of grossly normal tissue. The specimen and entire localizing tag were removed. The specimen was oriented and sent to radiology with the localization studies. Confirmation was received that the entire target lesion had been resected. The wound was irrigated. Hemostasis was checked. The wound was closed with interrupted sutures of 3-0 Vicryl and a subcuticular suture of Monocryl 3-0. No attempt was made to close the dead space.   The previously biopsied positive lymph node was targeted with [facility] scout.  A hand-held gamma probe was used to identify the location of the hottest spot in the axilla. An incision was made around the caudal axillary hairline. Dissection was carried down until subdermal facias was advanced.  With the use of the [facility] scout a targeted node dissection of the possible lymph node was done.  Of note when these positive and that was placed at the sentinel lymph node probe, it captured a count of 554.  With the use the medial blue and technetium, 2 additional hot and blue spot where detected and the node was excised in similar fashion. No additional palpable abnormality was identified. No clinically abnormal nodes were palpated.  Lymph node #2 and #3 were sent for frozen section.  As per pathologist due to lobular features unable to perform frozen section.  I decided to avoid a complete axillary node dissection until final pathology result gives any indication for a complete axillary node dissection.  The procedure was terminated. Hemostasis was achieved and the wound closed in layers with deep interrupted 3-0 Vicryl and skin was  closed with subcuticular suture of Monocryl 3-0.  The patient tolerated the procedure well and was taken to the postanesthesia care unit in stable condition.   Sentinel Node Biopsy Synoptic Operative Report  Operation performed with curative intent:Yes  Tracer(s) used to identify sentinel nodes in the upfront surgery (non-neoadjuvant) setting (select all that apply):Dye and Radioactive Tracer  Tracer(s) used to identify sentinel nodes in the neoadjuvant setting (select all that apply):N/A  All nodes (colored or non-colored) present at the end of a dye-filled lymphatic channel were removed:Yes   All significantly radioactive nodes were removed:Yes  All palpable suspicious nodes were removed:N/A  Biopsy-proven positive nodes marked with clips prior to chemotherapy were identified and removed:Yes  Specimen: Left Breast mass                     Sentinel lymph node #1 (previously biopsied positive lymph node)                    Sentinel Lymph nodes #2 #3  Complications: None  Estimated Blood Loss: 15 mL

## 2023-06-23 ENCOUNTER — Encounter: Payer: Self-pay | Admitting: General Surgery

## 2023-06-23 ENCOUNTER — Encounter: Payer: Self-pay | Admitting: Licensed Clinical Social Worker

## 2023-06-23 ENCOUNTER — Ambulatory Visit: Payer: Self-pay | Admitting: Licensed Clinical Social Worker

## 2023-06-23 ENCOUNTER — Telehealth: Payer: Self-pay | Admitting: Licensed Clinical Social Worker

## 2023-06-23 DIAGNOSIS — Z1379 Encounter for other screening for genetic and chromosomal anomalies: Secondary | ICD-10-CM | POA: Insufficient documentation

## 2023-06-23 NOTE — Anesthesia Postprocedure Evaluation (Signed)
Anesthesia Post Note  Patient: Audrey Ball  Procedure(s) Performed: PART MASTECTOMY,RADIO FREQUENCY LOCALIZER,AXILLARY SENTINEL NODE BIOPSY (Left: Breast)  Patient location during evaluation: PACU Anesthesia Type: General Level of consciousness: awake and alert Pain management: pain level controlled Vital Signs Assessment: post-procedure vital signs reviewed and stable Respiratory status: spontaneous breathing, nonlabored ventilation, respiratory function stable and patient connected to nasal cannula oxygen Cardiovascular status: blood pressure returned to baseline and stable Postop Assessment: no apparent nausea or vomiting Anesthetic complications: no   There were no known notable events for this encounter.   Last Vitals:  Vitals:   06/22/23 1321 06/22/23 1339  BP:  128/80  Pulse: 63 75  Resp: 13 16  Temp: 36.6 C (!) 36.4 C  SpO2: 96% 98%    Last Pain:  Vitals:   06/23/23 0919  TempSrc:   PainSc: 0-No pain                 Louie Boston

## 2023-06-23 NOTE — Telephone Encounter (Signed)
 I contacted Ms. Sens to discuss her genetic testing results. No pathogenic variants were identified in the 39 genes analyzed. Detailed clinic note to follow.   The test report has been scanned into EPIC and is located under the Molecular Pathology section of the Results Review tab.  A portion of the result report is included below for reference.      Dena Cary, MS, [facility] Genetic Counselor [email or web address] Phone: [phone]

## 2023-06-23 NOTE — Progress Notes (Signed)
 HPI:   Audrey Ball was previously seen in the [facility] due to a personal and family history of cancer and concerns regarding a hereditary predisposition to cancer. Please refer to our prior cancer genetics clinic note for more information regarding our discussion, assessment and recommendations, at the time. Audrey Ball recent genetic test results were disclosed to her, as were recommendations warranted by these results. These results and recommendations are discussed in more detail below.  CANCER HISTORY:  Oncology History  Malignant neoplasm of upper-outer quadrant of left breast in female, estrogen receptor positive (HCC)  06/07/2023 Initial Diagnosis   Malignant neoplasm of upper-outer quadrant of left breast in female, estrogen receptor positive (HCC)   06/07/2023 Cancer Staging   Staging form: Breast, AJCC 8th Edition - Clinical stage from 06/07/2023: Stage IB (cT1c, cN1, cM0, G2, ER+, PR+, HER2-) - Signed by Melanee Annah BROCKS, MD on 06/07/2023 Histologic grading system: 3 grade system    Genetic Testing   No pathogenic variants identified on the [facility] CancerNext+RNA panel. VUS in APC called [id number] identified. The report date is 06/22/2023.  The [facility] CancerNext+RNAinsight Panel includes sequencing, rearrangement analysis, and RNA analysis for the following 39 genes: APC, ATM, BAP1, BARD1, BMPR1A, BRCA1, BRCA2, BRIP1, CDH1, CDKN2A, CHEK2, FH, FLCN, MET, MLH1, MSH2, MSH6, MUTYH, NF1, NTHL1, PALB2, PMS2, PTEN, RAD51C, RAD51D, SMAD4, STK11, TP53, TSC1, TSC2, and VHL (sequencing and deletion/duplication); AXIN2, HOXB13, MBD4, MSH3, POLD1 and POLE (sequencing only); EPCAM and GREM1 (deletion/duplication only).     FAMILY HISTORY:  We obtained a detailed, 4-generation family history.  Significant diagnoses are listed below: Family History  Problem Relation Age of Onset   Leukemia Mother 50   Hypertension Mother    Diabetes Mother    Lung cancer Father 75    Throat cancer Brother        Currently in remission.   Lung cancer Maternal Grandmother        d.>50   Breast cancer Paternal Grandmother        dx 59s    Audrey Ball has 2 daughters, 54 and 26. She has 2 brothers. One brother had throat cancer and history of smoking.   Audrey Ball mother is living at 53 and had leukemia at 30. Maternal grandmother had lung cancer and passed over age 78.   Audrey Ball father passed of lung cancer at 34. Paternal grandmother had breast cancer in her 87s and died of metastatic disease.   Audrey Ball is unaware of previous family history of genetic testing for hereditary cancer risks. There is no reported Ashkenazi Jewish ancestry. There is no known consanguinity.     GENETIC TEST RESULTS:  The [facility] CancerNext+RNA Panel found no pathogenic mutations.   The [facility] CancerNext+RNAinsight Panel includes sequencing, rearrangement analysis, and RNA analysis for the following 39 genes: APC, ATM, BAP1, BARD1, BMPR1A, BRCA1, BRCA2, BRIP1, CDH1, CDKN2A, CHEK2, FH, FLCN, MET, MLH1, MSH2, MSH6, MUTYH, NF1, NTHL1, PALB2, PMS2, PTEN, RAD51C, RAD51D, SMAD4, STK11, TP53, TSC1, TSC2, and VHL (sequencing and deletion/duplication); AXIN2, HOXB13, MBD4, MSH3, POLD1 and POLE (sequencing only); EPCAM and GREM1 (deletion/duplication only).   The test report has been scanned into EPIC and is located under the Molecular Pathology section of the Results Review tab.  A portion of the result report is included below for reference. Genetic testing reported out on 06/22/2023.     Even though a pathogenic variant was not identified, possible explanations for the cancer in the family may include: There may be  no hereditary risk for cancer in the family. The cancers in Audrey Ball and/or her family may be sporadic/familial or due to other genetic and environmental factors. There may be a gene mutation in one of these genes that current testing methods cannot detect but that  chance is small. There could be another gene that has not yet been discovered, or that we have not yet tested, that is responsible for the cancer diagnoses in the family.  It is also possible there is a hereditary cause for the cancer in the family that Audrey Ball did not inherit.  Therefore, it is important to remain in touch with cancer genetics in the future so that we can continue to offer Audrey Ball the most up to date genetic testing.   ADDITIONAL GENETIC TESTING:  We discussed with Audrey Ball that her genetic testing was fairly extensive.  If there are additional relevant genes identified to increase cancer risk that can be analyzed in the future, we would be happy to discuss and coordinate this testing at that time.    CANCER SCREENING RECOMMENDATIONS:  Audrey Ball test result is considered negative (normal).  This means that we have not identified a hereditary cause for her personal and family history of cancer at this time.   An individual's cancer risk and medical management are not determined by genetic test results alone. Overall cancer risk assessment incorporates additional factors, including personal medical history, family history, and any available genetic information that may result in a personalized plan for cancer prevention and surveillance. Therefore, it is recommended she continue to follow the cancer management and screening guidelines provided by her oncology and primary healthcare provider.  RECOMMENDATIONS FOR FAMILY MEMBERS:   Since she did not inherit a identifiable mutation in a cancer predisposition gene included on this panel, her children could not have inherited a known mutation from her in one of these genes. Individuals in this family might be at some increased risk of developing cancer, over the general population risk, due to the family history of cancer.  Individuals in the family should notify their providers of the family history of cancer. We  recommend women in this family have a yearly mammogram beginning at age 59, or 33 years younger than the earliest onset of cancer, an annual clinical breast exam, and perform monthly breast self-exams.  Family members should have colonoscopies by at age 32, or earlier, as recommended by their providers. We do not recommend familial testing for the APC variant of uncertain significance (VUS).  FOLLOW-UP:  Lastly, we discussed with Audrey Ball that cancer genetics is a rapidly advancing field and it is possible that new genetic tests will be appropriate for her and/or her family members in the future. We encouraged her to remain in contact with cancer genetics on an annual basis so we can update her personal and family histories and let her know of advances in cancer genetics that may benefit this family.   Our contact number was provided. Audrey Ball questions were answered to her satisfaction, and she knows she is welcome to call us  at anytime with additional questions or concerns.    Dena Cary, MS, [facility] Genetic Counselor [email or web address] Phone: [phone]

## 2023-06-29 LAB — SURGICAL PATHOLOGY

## 2023-06-30 ENCOUNTER — Encounter: Payer: Self-pay | Admitting: *Deleted

## 2023-06-30 NOTE — Progress Notes (Signed)
 Per Dr. Alena Bills, ok to send for oncotype testing.   Oncotype Dx order submitted online.

## 2023-07-03 ENCOUNTER — Encounter: Payer: Self-pay | Admitting: *Deleted

## 2023-07-03 NOTE — Progress Notes (Signed)
 Spoke with patient.  Her oncotype results are pending and her appointment has been rescheduled to 1/28.

## 2023-07-07 ENCOUNTER — Inpatient Hospital Stay: Payer: BC Managed Care – PPO | Admitting: Oncology

## 2023-07-08 ENCOUNTER — Ambulatory Visit: Payer: BC Managed Care – PPO | Admitting: Radiation Oncology

## 2023-07-08 ENCOUNTER — Inpatient Hospital Stay: Payer: BC Managed Care – PPO | Attending: Oncology | Admitting: Hospice and Palliative Medicine

## 2023-07-08 DIAGNOSIS — Z170 Estrogen receptor positive status [ER+]: Secondary | ICD-10-CM

## 2023-07-08 DIAGNOSIS — C50412 Malignant neoplasm of upper-outer quadrant of left female breast: Secondary | ICD-10-CM

## 2023-07-08 NOTE — Progress Notes (Signed)
 Multidisciplinary Oncology Council Documentation  Audrey Ball was presented by our [place] on 07/08/2023, which included representatives from:  Palliative Care Dietitian  Physical/Occupational Therapist Nurse Navigator Genetics Social work Survivorship RN Financial Navigator Research RN   Audrey Ball currently presents with history of breast ca  We reviewed previous medical and familial history, history of present illness, and recent lab results along with all available histopathologic and imaging studies. The MOC considered available treatment options and made the following recommendations/referrals:  SW, rehab screening  The MOC is a meeting of clinicians from various specialty areas who evaluate and discuss patients for whom a multidisciplinary approach is being considered. Final determinations in the plan of care are those of the provider(s).   Today's extended care, comprehensive team conference, Audrey Ball was not present for the discussion and was not examined.

## 2023-07-10 ENCOUNTER — Inpatient Hospital Stay: Payer: BC Managed Care – PPO

## 2023-07-10 NOTE — Progress Notes (Signed)
[facility] Clinical Social Work  Initial Assessment   Audrey Ball is a 53 y.o. year old female contacted by phone. Clinical Social Work was referred by  [facility]  for assessment of psychosocial needs.   SDOH (Social Determinants of Health) assessments performed: Yes SDOH Interventions    Flowsheet Row Clinical Support from 07/10/2023 in [facility] Dept Of [place]  SDOH Interventions   Health Literacy Interventions Intervention Not Indicated       SDOH Screenings   Food Insecurity: No Food Insecurity (06/04/2023)   Received from [facility]  Housing: Low Risk  (06/25/2023)   [facility] from [facility]  Transportation Needs: No Transportation Needs (06/04/2023)   Received from [facility]  Utilities: Not At Risk (06/04/2023)   Received from [facility]  Alcohol Screen: Low Risk  (03/04/2022)  Depression (PHQ2-9): Low Risk  (06/03/2023)  Financial Resource Strain: Low Risk  (06/04/2023)   Received from [facility]  Tobacco Use: Low Risk  (07/07/2023)   Received from [facility]  Health Literacy: Adequate Health Literacy (07/10/2023)     Distress Screen completed: No    06/03/2023    2:35 PM  ONCBCN DISTRESS SCREENING  Screening Type Initial Screening  Distress experienced in past week (1-10) 0      Family/Social Information:  Housing Arrangement: patient lives with Audrey Ball. Family members/support persons in your life? Family and Friends Transportation concerns: no  Employment: Out on work excuse Income source: Ecologist concerns: No Type of concern: None Food access concerns: no Religious or spiritual practice: Not known Services Currently in place:  BCBS  Coping/ Adjustment to diagnosis: Patient understands treatment plan and what happens next? yes Concerns about diagnosis and/or treatment: I'm  not especially worried about anything Patient reported stressors:  None per patient. Hopes and/or priorities: Family Patient enjoys time with family/ friends Current coping skills/ strengths: Average or above average intelligence , Capable of independent living , Manufacturing systems engineer , Contractor , General fund of knowledge , Motivation for treatment/growth , and Supportive family/friends     SUMMARY: Current SDOH Barriers:  None per patient.  Clinical Social Work Clinical Goal(s):  Explore community resource options for unmet needs related to:  None  Interventions: Discussed common feeling and emotions when being diagnosed with cancer, and the importance of support during treatment Informed patient of the support team roles and support services at [facility] Provided CSW contact information and encouraged patient to call with any questions or concerns Provided patient with information about the [employer].  She was interested in the two massages.  Mailed certificates.   Follow Up Plan: CSW will follow-up with patient by phone  Patient verbalizes understanding of plan: Yes    Dorothey Baseman, LCSW Clinical Social Worker [facility]

## 2023-07-13 ENCOUNTER — Ambulatory Visit: Payer: BC Managed Care – PPO | Admitting: Nurse Practitioner

## 2023-07-13 ENCOUNTER — Ambulatory Visit: Payer: BC Managed Care – PPO | Attending: General Surgery | Admitting: Occupational Therapy

## 2023-07-15 ENCOUNTER — Encounter: Payer: Self-pay | Admitting: *Deleted

## 2023-07-15 NOTE — Progress Notes (Signed)
Called [facility] to check on status of specimen being sent to [employer].   Specimen will be sent out today.

## 2023-07-20 ENCOUNTER — Telehealth: Payer: Self-pay | Admitting: Oncology

## 2023-07-20 ENCOUNTER — Other Ambulatory Visit: Payer: Self-pay | Admitting: Nurse Practitioner

## 2023-07-20 MED ORDER — DOXYCYCLINE HYCLATE 100 MG PO TABS: 100.0000 mg | ORAL_TABLET | Freq: Two times a day (BID) | ORAL | 0 refills | Status: AC

## 2023-07-20 NOTE — Telephone Encounter (Signed)
Patient called to reschedule appointment due to not feeling well. Appointment rescheduled as requested

## 2023-07-21 ENCOUNTER — Inpatient Hospital Stay: Payer: BC Managed Care – PPO | Admitting: Oncology

## 2023-07-22 ENCOUNTER — Encounter: Payer: Self-pay | Admitting: *Deleted

## 2023-07-22 ENCOUNTER — Encounter: Payer: Self-pay | Admitting: Oncology

## 2023-07-22 ENCOUNTER — Telehealth: Payer: Self-pay

## 2023-07-22 DIAGNOSIS — Z170 Estrogen receptor positive status [ER+]: Secondary | ICD-10-CM

## 2023-07-22 NOTE — Telephone Encounter (Signed)
Spoke with Alyssa she stated to stop taking the medicine and take some over the counter medicine and give office a call back if not better in a couple of days.

## 2023-07-22 NOTE — Progress Notes (Signed)
Let Audrey Ball know that her oncotype score came back low and she will not need chemotherapy.   She will see Dr. Smith Robert and Dr. Rushie Chestnut on Monday 2/3.

## 2023-07-27 ENCOUNTER — Ambulatory Visit
Admission: RE | Admit: 2023-07-27 | Discharge: 2023-07-27 | Disposition: A | Discharge status: Home / Self Care | Payer: BC Managed Care – PPO | Source: Ambulatory Visit | Attending: Radiation Oncology | Admitting: Radiation Oncology

## 2023-07-27 ENCOUNTER — Encounter: Payer: Self-pay | Admitting: Oncology

## 2023-07-27 ENCOUNTER — Encounter: Payer: Self-pay | Admitting: Radiation Oncology

## 2023-07-27 ENCOUNTER — Inpatient Hospital Stay: Payer: BC Managed Care – PPO | Attending: Oncology | Admitting: Oncology

## 2023-07-27 VITALS — BP 144/98 | HR 63 | Temp 97.4°F | Resp 16 | Ht 67.0 in | Wt 213.0 lb

## 2023-07-27 VITALS — BP 144/98 | HR 63 | Temp 97.0°F | Resp 16 | Ht 67.0 in | Wt 213.0 lb

## 2023-07-27 DIAGNOSIS — Z7189 Other specified counseling: Secondary | ICD-10-CM | POA: Diagnosis not present

## 2023-07-27 DIAGNOSIS — Z803 Family history of malignant neoplasm of breast: Secondary | ICD-10-CM | POA: Insufficient documentation

## 2023-07-27 DIAGNOSIS — Z510 Encounter for antineoplastic radiation therapy: Secondary | ICD-10-CM | POA: Insufficient documentation

## 2023-07-27 DIAGNOSIS — Z808 Family history of malignant neoplasm of other organs or systems: Secondary | ICD-10-CM | POA: Insufficient documentation

## 2023-07-27 DIAGNOSIS — Z801 Family history of malignant neoplasm of trachea, bronchus and lung: Secondary | ICD-10-CM | POA: Diagnosis not present

## 2023-07-27 DIAGNOSIS — C50412 Malignant neoplasm of upper-outer quadrant of left female breast: Secondary | ICD-10-CM | POA: Insufficient documentation

## 2023-07-27 DIAGNOSIS — C773 Secondary and unspecified malignant neoplasm of axilla and upper limb lymph nodes: Secondary | ICD-10-CM | POA: Insufficient documentation

## 2023-07-27 DIAGNOSIS — Z1732 Human epidermal growth factor receptor 2 negative status: Secondary | ICD-10-CM | POA: Diagnosis not present

## 2023-07-27 DIAGNOSIS — Z170 Estrogen receptor positive status [ER+]: Secondary | ICD-10-CM | POA: Insufficient documentation

## 2023-07-27 DIAGNOSIS — Z1721 Progesterone receptor positive status: Secondary | ICD-10-CM | POA: Diagnosis not present

## 2023-07-27 DIAGNOSIS — Z79899 Other long term (current) drug therapy: Secondary | ICD-10-CM | POA: Insufficient documentation

## 2023-07-27 DIAGNOSIS — Z806 Family history of leukemia: Secondary | ICD-10-CM | POA: Insufficient documentation

## 2023-07-27 MED ORDER — LETROZOLE 2.5 MG PO TABS: 2.5000 mg | ORAL_TABLET | Freq: Every day | ORAL | 3 refills | Status: DC

## 2023-07-27 NOTE — Progress Notes (Unsigned)
Hematology/Oncology Consult note [facility]  Telephone:([phone] [phone]  Patient Care Team: Sallyanne Kuster, NP as PCP - General (Nurse Practitioner) Hulen Luster, RN as Oncology Nurse Navigator Creig Hines, MD as Consulting Physician (Oncology) Carmina Miller, MD as Consulting Physician (Radiation Oncology)   Name of the patient: Audrey Ball  [id number]  [birth year]   Date of visit: 07/27/23  Diagnosis- ***  Chief complaint/ Reason for visit- ***  Heme/Onc history: ***  Interval history- ***  ECOG PS- *** Pain scale- *** Opioid associated constipation- ***  Review of systems- ROS    Allergies  Allergen Reactions   Bactrim [Sulfamethoxazole-Trimethoprim] Hives and Swelling    Swelling in lips and leg   Sulfa Antibiotics Hives   Penicillins Rash   Propoxyphene Other (See Comments)    headache      Past Medical History:  Diagnosis Date   Breast cancer (HCC)    Hypertension      Past Surgical History:  Procedure Laterality Date   BACK SURGERY  2017   BREAST BIOPSY Left 05/27/2023   u/s bx,mass 10:00, RIBBON clip-path pending   BREAST BIOPSY Left 05/27/2023   u/s bx, axilla/BUTTERFLY clippath pending   BREAST BIOPSY Left 05/27/2023   [place] LT BREAST BX W LOC DEV 1ST LESION IMG BX SPEC US GUIDE 05/27/2023 [facility]   BREAST BIOPSY Left 06/15/2023   [place] LT RADIO FREQUENCY TAG LOC US GUIDE 06/15/2023 [facility]   BREAST BIOPSY Left 06/15/2023   [place] LT RADIO FREQUENCY TAG EA ADD LESION LOC US GUIDE 06/15/2023 [facility]   COLONOSCOPY WITH PROPOFOL N/A 08/16/2021   Procedure: COLONOSCOPY WITH PROPOFOL;  Surgeon: Toney Reil, MD;  Location: [facility];  Service: Gastroenterology;  Laterality: N/A;   GALLBLADDER SURGERY  1999   PART MASTECTOMY,RADIO FREQUENCY LOCALIZER,AXILLARY SENTINEL NODE BIOPSY Left 06/22/2023   Procedure: PART MASTECTOMY,RADIO FREQUENCY LOCALIZER,AXILLARY SENTINEL  NODE BIOPSY;  Surgeon: Carolan Shiver, MD;  Location: [facility] ORS;  Service: General;  Laterality: Left;   TOTAL VAGINAL HYSTERECTOMY  2016   bilateral [facility] for abnormal uterine bleeding    Social History   Socioeconomic History   Marital status: Single    Spouse name: Not on file   Number of children: Not on file   Years of education: Not on file   Highest education level: Not on file  Occupational History   Not on file  Tobacco Use   Smoking status: Never   Smokeless tobacco: Never  Vaping Use   Vaping status: Never Used  Substance and Sexual Activity   Alcohol use: No   Drug use: No   Sexual activity: Yes    Birth control/protection: Surgical  Other Topics Concern   Not on file  Social History Narrative   Not on file   Social Drivers of Health   Financial Resource Strain: Low Risk  (06/04/2023)   Received from [facility]   Overall Financial Resource Strain (CARDIA)    Difficulty of Paying Living Expenses: Not hard at all  Food Insecurity: No Food Insecurity (06/04/2023)   Received from [facility]   Hunger Vital Sign    Worried About Running Out of Food in the Last Year: Never true    Ran Out of Food in the Last Year: Never true  Transportation Needs: No Transportation Needs (06/04/2023)   Received from [facility]    In the past 12 months,  has lack of transportation kept you from medical appointments or from getting medications?: No    Lack of Transportation (Non-Medical): No  Physical Activity: Not on file  Stress: Not on file  Social Connections: Not on file  Intimate Partner Violence: Not At Risk (06/03/2023)   Humiliation, Afraid, Rape, and Kick questionnaire    Fear of Current or Ex-Partner: No    Emotionally Abused: No    Physically Abused: No    Sexually Abused: No    Family History  Problem Relation Age of Onset   Leukemia Mother 82    Hypertension Mother    Diabetes Mother    Lung cancer Father 44   Throat cancer Brother        Currently in remission.   Lung cancer Maternal Grandmother        d.>50   Breast cancer Paternal Grandmother        dx 38s     Current Outpatient Medications:    doxycycline (VIBRA-TABS) 100 MG tablet, Take 1 tablet (100 mg total) by mouth 2 (two) times daily for 10 days. Take with food, Disp: 20 tablet, Rfl: 0   acetaminophen (TYLENOL) 650 MG CR tablet, Take 650 mg by mouth at bedtime., Disp: , Rfl:    amphetamine-dextroamphetamine (ADDERALL) 20 MG tablet, TAKE 1 TABLET BY MOUTH TWICE A DAY AS NEEDED FOR FOCUS (Patient not taking: Reported on 06/11/2023), Disp: 60 tablet, Rfl: 0   amphetamine-dextroamphetamine (ADDERALL) 20 MG tablet, TAKE 1 TABLET BY MOUTH TWICE A DAY AS NEEDED FOR FOCUS (Patient not taking: Reported on 06/11/2023), Disp: 60 tablet, Rfl: 0   amphetamine-dextroamphetamine (ADDERALL) 20 MG tablet, TAKE 1 TABLET BY MOUTH TWICE A DAY AS NEEDED FOR FOCUS, Disp: 60 tablet, Rfl: 0   betamethasone valerate ointment (VALISONE) 0.1 %, Apply 1 Application topically daily as needed (rash). To affected area until resolved., Disp: 45 g, Rfl: 5   Cholecalciferol (VITAMIN D) 50 MCG (2000 UT) tablet, Take 2,000 Units by mouth daily., Disp: , Rfl:    hydrochlorothiazide (HYDRODIURIL) 25 MG tablet, TAKE 1 TABLET (25 MG TOTAL) BY MOUTH DAILY., Disp: 90 tablet, Rfl: 1   meloxicam (MOBIC) 15 MG tablet, Take 1 tablet (15 mg total) by mouth daily. (Patient taking differently: Take 15 mg by mouth at bedtime.), Disp: 90 tablet, Rfl: 1   oxyCODONE-acetaminophen (PERCOCET) 5-325 MG tablet, Take 1 tablet by mouth every 4 (four) hours as needed for severe pain (pain score 7-10)., Disp: 20 tablet, Rfl: 0   traMADol (ULTRAM) 50 MG tablet, Take 1 tablet (50 mg total) by mouth every 6 (six) hours as needed for moderate pain or severe pain., Disp: 20 tablet, Rfl: 0  Physical exam:  Vitals:   07/27/23 1510  BP:  (!) 144/98  Pulse: 63  Resp: 16  Temp: (!) 97.4 F (36.3 C)  TempSrc: Tympanic  SpO2: 100%  Weight: 213 lb (96.6 kg)  Height: 5\' 7"  (1.702 m)   Physical Exam      Latest Ref Rng & Units 07/07/2022    9:39 AM  CMP  Glucose 70 - 99 mg/dL 130   BUN 6 - 24 mg/dL 13   Creatinine 8.65 - 1.00 mg/dL 7.84   Sodium 696 - 295 mmol/L 143   Potassium 3.5 - 5.2 mmol/L 3.6   Chloride 96 - 106 mmol/L 99   CO2 20 - 29 mmol/L 27   Calcium 8.7 - 10.2 mg/dL 28.4   Total Protein 6.0 - 8.5 g/dL 7.4  Total Bilirubin 0.0 - 1.2 mg/dL 0.4   Alkaline Phos 44 - 121 IU/L 98   AST 0 - 40 IU/L 21   ALT 0 - 32 IU/L 22       Latest Ref Rng & Units 06/15/2023    2:23 PM  CBC  WBC 4.0 - 10.5 K/uL 5.7   Hemoglobin 12.0 - 15.0 g/dL 38.7   Hematocrit 56.4 - 46.0 % 41.3   Platelets 150 - 400 K/uL 284     No images are attached to the encounter.  No results found.   Assessment and plan- Patient is a 53 y.o. female ***   Visit Diagnosis 1. Malignant neoplasm of upper-outer quadrant of left breast in female, estrogen receptor positive (HCC)      Dr. Owens Shark, MD, MPH [facility] at [facility] [phone] 07/27/2023 9:35 PM

## 2023-07-27 NOTE — Consult Note (Signed)
NEW PATIENT EVALUATION  Name: Audrey Ball  MRN: [id number]  Date:   07/27/2023     DOB: [birth year]   This 53 y.o. 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   PREVIOUS INVESTIGATIONS:  Mammogram ultrasound reviewed Clinical notes reviewed  Pathology report reviewed  HPI: Patient is a 53 year old female who presented with a self discovered mass in her left breast.  Mammograms confirmed a 12 mm mass at the site of palpable concern in the left breast suspicious for malignancy.  There is also enlarged left axillary lymph node.  She underwent ultrasound-guided biopsy of both positive for invasive lobular carcinoma.  Breast MRI demonstrated 3.8 cm irregular spiculated mass in the upper quadrant inner quadrant of the left breast and 2.2 cm abnormal left axillary lymph node consistent with biopsy-proven metastatic adenopathy.  She went on to have a wide local excision for a 3.6 cm invasive ductal carcinoma with lobular features with overall grade of 2.  Lymphatic and vascular invasion was present.  Margins were negative although close at 1 mm in posterior and superior margin.  There were 4 lymph nodes sentinel retrieved with 3 positive for metastatic carcinoma largest deposit being 17 mm with extranodal extension present.  She went on to have an Oncotype DX performed with a recurrence score of 12 and she will not receive systemic chemotherapy.  Postoperatively she is doing well specifically denies breast tenderness cough or bone pain.  She  is now referred to radiation oncology for opinion.  PLANNED TREATMENT REGIMEN: Left whole breast and peripheral lymphatic radiation  PAST MEDICAL HISTORY:  has a past medical history of Breast cancer (HCC) and Hypertension.    PAST SURGICAL HISTORY:  Past Surgical History:  Procedure Laterality Date   BACK SURGERY  2017   BREAST BIOPSY Left 05/27/2023   u/s bx,mass 10:00, RIBBON clip-path pending   BREAST BIOPSY Left 05/27/2023   u/s bx, axilla/BUTTERFLY clippath pending   BREAST BIOPSY Left 05/27/2023   [place] LT BREAST BX W LOC DEV 1ST LESION IMG BX SPEC US GUIDE 05/27/2023 [facility]   BREAST BIOPSY Left 06/15/2023   [place] LT RADIO FREQUENCY TAG LOC US GUIDE 06/15/2023 [facility]   BREAST BIOPSY Left 06/15/2023   [place] LT RADIO FREQUENCY TAG EA ADD LESION LOC US GUIDE 06/15/2023 [facility]   COLONOSCOPY WITH PROPOFOL N/A 08/16/2021   Procedure: COLONOSCOPY WITH PROPOFOL;  Surgeon: Toney Reil, MD;  Location: [facility];  Service: Gastroenterology;  Laterality: N/A;   GALLBLADDER SURGERY  1999   PART MASTECTOMY,RADIO FREQUENCY LOCALIZER,AXILLARY SENTINEL NODE BIOPSY Left 06/22/2023   Procedure: PART MASTECTOMY,RADIO FREQUENCY LOCALIZER,AXILLARY SENTINEL NODE BIOPSY;  Surgeon: Carolan Shiver, MD;  Location: [facility];  Service: General;  Laterality: Left;   TOTAL VAGINAL HYSTERECTOMY  2016   bilateral salpingectomy at [facility] for abnormal uterine bleeding    FAMILY HISTORY: family history includes Breast cancer in her paternal grandmother; Diabetes in her mother; Hypertension in her mother; Leukemia (age of onset: 12) in her mother; Lung cancer in her maternal grandmother; Lung cancer (age of onset: 23) in her father;  Throat cancer in her brother.  SOCIAL HISTORY:  reports that she has never smoked. She has never used smokeless tobacco. She reports that she does not drink alcohol and does not use drugs.  ALLERGIES: Bactrim [sulfamethoxazole-trimethoprim], Sulfa  antibiotics, Penicillins, and Propoxyphene  MEDICATIONS:  Current Outpatient Medications  Medication Sig Dispense Refill   doxycycline (VIBRA-TABS) 100 MG tablet Take 1 tablet (100 mg total) by mouth 2 (two) times daily for 10 days. Take with food 20 tablet 0   acetaminophen (TYLENOL) 650 MG CR tablet Take 650 mg by mouth at bedtime.     amphetamine-dextroamphetamine (ADDERALL) 20 MG tablet TAKE 1 TABLET BY MOUTH TWICE A DAY AS NEEDED FOR FOCUS (Patient not taking: Reported on 06/11/2023) 60 tablet 0   amphetamine-dextroamphetamine (ADDERALL) 20 MG tablet TAKE 1 TABLET BY MOUTH TWICE A DAY AS NEEDED FOR FOCUS (Patient not taking: Reported on 06/11/2023) 60 tablet 0   amphetamine-dextroamphetamine (ADDERALL) 20 MG tablet TAKE 1 TABLET BY MOUTH TWICE A DAY AS NEEDED FOR FOCUS 60 tablet 0   betamethasone valerate ointment (VALISONE) 0.1 % Apply 1 Application topically daily as needed (rash). To affected area until resolved. 45 g 5   Cholecalciferol (VITAMIN D) 50 MCG (2000 UT) tablet Take 2,000 Units by mouth daily.     hydrochlorothiazide (HYDRODIURIL) 25 MG tablet TAKE 1 TABLET (25 MG TOTAL) BY MOUTH DAILY. 90 tablet 1   meloxicam (MOBIC) 15 MG tablet Take 1 tablet (15 mg total) by mouth daily. (Patient taking differently: Take 15 mg by mouth at bedtime.) 90 tablet 1   oxyCODONE-acetaminophen (PERCOCET) 5-325 MG tablet Take 1 tablet by mouth every 4 (four) hours as needed for severe pain (pain score 7-10). 20 tablet 0   traMADol (ULTRAM) 50 MG tablet Take 1 tablet (50 mg total) by mouth every 6 (six) hours as needed for moderate pain or severe pain. 20 tablet 0   No current facility-administered medications for this encounter.    ECOG PERFORMANCE STATUS:  0 - Asymptomatic  REVIEW OF SYSTEMS: Patient denies any weight loss, fatigue, weakness, fever, chills or night sweats. Patient denies any loss of vision, blurred vision. Patient denies any ringing  of the ears or hearing loss. No irregular  heartbeat. Patient denies heart murmur or history of fainting. Patient denies any chest pain or pain radiating to her upper extremities. Patient denies any shortness of breath, difficulty breathing at night, cough or hemoptysis. Patient denies any swelling in the lower legs. Patient denies any nausea vomiting, vomiting of blood, or coffee ground material in the vomitus. Patient denies any stomach pain. Patient states has had normal bowel movements no significant constipation or diarrhea. Patient denies any dysuria, hematuria or significant nocturia. Patient denies any problems walking, swelling in the joints or loss of balance. Patient denies any skin changes, loss of hair or loss of weight. Patient denies any excessive worrying or anxiety or significant depression. Patient denies any problems with insomnia. Patient denies excessive thirst, polyuria, polydipsia. Patient denies any swollen glands, patient denies easy bruising or easy bleeding. Patient denies any recent infections, allergies or URI. Patient "s visual fields have not changed significantly in recent time.   PHYSICAL EXAM: BP (!) 144/98   Pulse 63   Temp (!) 97 F (36.1 C) (Tympanic)   Resp 16   Ht 5\' 7"  (1.702 m)   Wt 213 lb (96.6 kg)   BMI 33.36 kg/m  Patient breasts are large and pendulous.  Her wide local excision scar is healed  well.  No dominant masses noted in either breast.  No axillary or supraclavicular adenopathy is appreciated.  No evidence of lymphedema in her left upper extremity is noted.  Well-developed well-nourished patient in NAD. HEENT reveals PERLA, EOMI, discs not visualized.  Oral cavity is clear. No oral mucosal lesions are identified. Neck is clear without evidence of cervical or supraclavicular adenopathy. Lungs are clear to A&P. Cardiac examination is essentially unremarkable with regular rate and rhythm without murmur rub or thrill. Abdomen is benign with no organomegaly or masses noted. Motor sensory and DTR  levels are equal and symmetric in the upper and lower extremities. Cranial nerves II through XII are grossly intact. Proprioception is intact. No peripheral adenopathy or edema is identified. No motor or sensory levels are noted. Crude visual fields are within normal range.  LABORATORY DATA: Pathology reports reviewed    RADIOLOGY RESULTS: Mammogram ultrasound and MRI scans reviewed compatible with above-stated findings   IMPRESSION: Stage IIb invasive ductal carcinoma with lobular features of the left breast status post wide local excision and sentinel node biopsy and 53 year old female  PLAN: At this time I have recommended radiation therapy to her left breast and peripheral lymphatics.  Would plan on delivering 5040 cGy in 28 fractions based on the large size of her breast and involvement of her lymph nodes.  Would also boost her scar another 1600 centigrade using photon beam therapy based on the 1 mm margin.  Risks and benefits of treatment including skin reaction fatigue alteration blood counts possible inclusion of superficial lung slight chance of lymphedema of her left upper extremity all were explained in detail to the patient.  We will try to treat her with breath-hold technique to spare cardiac toxicity.  Patient comprehends my recommendations well as personally set up and ordered CT simulation.  Patient also will benefit from endocrine therapy after completion of radiation.  I would like to take this opportunity to thank you for allowing me to participate in the care of your patient.Carmina Miller, MD

## 2023-07-28 ENCOUNTER — Ambulatory Visit: Payer: BC Managed Care – PPO | Admitting: Oncology

## 2023-07-28 ENCOUNTER — Telehealth: Payer: Self-pay | Admitting: Pharmacist

## 2023-07-28 ENCOUNTER — Other Ambulatory Visit (HOSPITAL_COMMUNITY): Payer: Self-pay

## 2023-07-28 ENCOUNTER — Encounter: Payer: Self-pay | Admitting: *Deleted

## 2023-07-28 ENCOUNTER — Telehealth: Payer: Self-pay

## 2023-07-28 DIAGNOSIS — C50412 Malignant neoplasm of upper-outer quadrant of left female breast: Secondary | ICD-10-CM | POA: Diagnosis not present

## 2023-07-28 DIAGNOSIS — Z170 Estrogen receptor positive status [ER+]: Secondary | ICD-10-CM | POA: Diagnosis not present

## 2023-07-28 NOTE — Progress Notes (Signed)
Called Audrey Ball to let her know to go ahead and start taking her letrozole per Dr. Smith Robert.   She verbalized understanding and will go pick up the Rx.

## 2023-07-28 NOTE — Telephone Encounter (Signed)
 Clinical Pharmacist Practitioner Encounter  New authorization  Received notification from [employer] that prior authorization for Verzenio  is required.   PA submitted on CMM Key [id number] Status is pending   Oral Oncology Clinic will continue to follow.   Audrey Ball N. Audrey Ball, PharmD, BCOP, CPP Hematology/Oncology Clinical Pharmacist [facility] Oral Chemotherapy Navigation Clinic [phone]  07/28/2023 1:31 PM

## 2023-07-28 NOTE — Telephone Encounter (Signed)
 Oral Chemotherapy Pharmacist Encounter   Received new prescription for Verzenio  (abemaciclib ) for the adjuvant treatment of ER/PR+ HER2- breast cancer in conjunction with letrozole , planned duration of 2 years.  Labs from 06/15/23 assessed, no baseline changes required. Prescription dose and frequency assessed.   Current medication list in Epic reviewed, no DDIs with abemaciclib  identified.   Evaluated chart and no patient barriers to medication adherence identified.   Prescription has been e-scribed to the [facility] for benefits analysis and approval.  Oral Oncology Clinic will continue to follow for insurance authorization, copayment issues, initial counseling and start date.   Corean Sever, PharmD PGY2 Oncology Pharmacy Resident   07/28/2023 9:13 AM

## 2023-07-29 ENCOUNTER — Other Ambulatory Visit (HOSPITAL_COMMUNITY): Payer: Self-pay

## 2023-07-30 ENCOUNTER — Ambulatory Visit
Admission: RE | Admit: 2023-07-30 | Discharge: 2023-07-30 | Disposition: A | Discharge status: Home / Self Care | Payer: BC Managed Care – PPO | Source: Ambulatory Visit | Attending: Radiation Oncology | Admitting: Radiation Oncology

## 2023-07-30 ENCOUNTER — Encounter: Payer: Self-pay | Admitting: *Deleted

## 2023-07-30 DIAGNOSIS — Z79899 Other long term (current) drug therapy: Secondary | ICD-10-CM | POA: Diagnosis not present

## 2023-07-30 DIAGNOSIS — Z801 Family history of malignant neoplasm of trachea, bronchus and lung: Secondary | ICD-10-CM | POA: Diagnosis not present

## 2023-07-30 DIAGNOSIS — C50412 Malignant neoplasm of upper-outer quadrant of left female breast: Secondary | ICD-10-CM | POA: Diagnosis not present

## 2023-07-30 DIAGNOSIS — Z510 Encounter for antineoplastic radiation therapy: Secondary | ICD-10-CM | POA: Diagnosis not present

## 2023-07-30 DIAGNOSIS — Z808 Family history of malignant neoplasm of other organs or systems: Secondary | ICD-10-CM | POA: Diagnosis not present

## 2023-07-30 DIAGNOSIS — Z1732 Human epidermal growth factor receptor 2 negative status: Secondary | ICD-10-CM | POA: Diagnosis not present

## 2023-07-30 DIAGNOSIS — Z170 Estrogen receptor positive status [ER+]: Secondary | ICD-10-CM | POA: Diagnosis not present

## 2023-07-30 DIAGNOSIS — C773 Secondary and unspecified malignant neoplasm of axilla and upper limb lymph nodes: Secondary | ICD-10-CM | POA: Diagnosis not present

## 2023-07-30 DIAGNOSIS — Z803 Family history of malignant neoplasm of breast: Secondary | ICD-10-CM | POA: Diagnosis not present

## 2023-07-30 DIAGNOSIS — Z806 Family history of leukemia: Secondary | ICD-10-CM | POA: Diagnosis not present

## 2023-07-30 DIAGNOSIS — Z1721 Progesterone receptor positive status: Secondary | ICD-10-CM | POA: Diagnosis not present

## 2023-07-31 ENCOUNTER — Other Ambulatory Visit: Payer: Self-pay | Admitting: *Deleted

## 2023-07-31 DIAGNOSIS — C50412 Malignant neoplasm of upper-outer quadrant of left female breast: Secondary | ICD-10-CM

## 2023-08-03 DIAGNOSIS — Z806 Family history of leukemia: Secondary | ICD-10-CM | POA: Diagnosis not present

## 2023-08-03 DIAGNOSIS — Z79899 Other long term (current) drug therapy: Secondary | ICD-10-CM | POA: Diagnosis not present

## 2023-08-03 DIAGNOSIS — Z808 Family history of malignant neoplasm of other organs or systems: Secondary | ICD-10-CM | POA: Diagnosis not present

## 2023-08-03 DIAGNOSIS — C50412 Malignant neoplasm of upper-outer quadrant of left female breast: Secondary | ICD-10-CM | POA: Diagnosis not present

## 2023-08-03 DIAGNOSIS — Z510 Encounter for antineoplastic radiation therapy: Secondary | ICD-10-CM | POA: Diagnosis not present

## 2023-08-03 DIAGNOSIS — Z803 Family history of malignant neoplasm of breast: Secondary | ICD-10-CM | POA: Diagnosis not present

## 2023-08-03 DIAGNOSIS — Z170 Estrogen receptor positive status [ER+]: Secondary | ICD-10-CM | POA: Diagnosis not present

## 2023-08-03 DIAGNOSIS — C773 Secondary and unspecified malignant neoplasm of axilla and upper limb lymph nodes: Secondary | ICD-10-CM | POA: Diagnosis not present

## 2023-08-03 DIAGNOSIS — Z1721 Progesterone receptor positive status: Secondary | ICD-10-CM | POA: Diagnosis not present

## 2023-08-03 DIAGNOSIS — Z1732 Human epidermal growth factor receptor 2 negative status: Secondary | ICD-10-CM | POA: Diagnosis not present

## 2023-08-03 DIAGNOSIS — Z801 Family history of malignant neoplasm of trachea, bronchus and lung: Secondary | ICD-10-CM | POA: Diagnosis not present

## 2023-08-04 ENCOUNTER — Encounter
Admission: RE | Admit: 2023-08-04 | Discharge: 2023-08-04 | Disposition: A | Discharge status: Home / Self Care | Payer: BC Managed Care – PPO | Source: Ambulatory Visit | Attending: Oncology | Admitting: Oncology

## 2023-08-04 DIAGNOSIS — C50412 Malignant neoplasm of upper-outer quadrant of left female breast: Secondary | ICD-10-CM | POA: Diagnosis not present

## 2023-08-04 DIAGNOSIS — R911 Solitary pulmonary nodule: Secondary | ICD-10-CM | POA: Diagnosis not present

## 2023-08-04 DIAGNOSIS — Z170 Estrogen receptor positive status [ER+]: Secondary | ICD-10-CM | POA: Diagnosis not present

## 2023-08-04 LAB — GLUCOSE, CAPILLARY: Glucose-Capillary: 85 mg/dL (ref 70–99)

## 2023-08-04 MED ORDER — FLUDEOXYGLUCOSE F - 18 (FDG) INJECTION
11.0000 | Freq: Once | INTRAVENOUS | Status: AC | PRN
  Administered 2023-08-04: 11.78 via INTRAVENOUS

## 2023-08-05 ENCOUNTER — Other Ambulatory Visit (HOSPITAL_COMMUNITY): Payer: Self-pay

## 2023-08-05 NOTE — Telephone Encounter (Signed)
Oral Oncology Patient Advocate Encounter  Prior Authorization for Audrey Ball has been approved.    PA# [id number]  Effective dates: 06/29/23 through 08/04/25  Patients co-pay is $.    Ardeen Fillers, CPhT Oncology Pharmacy Patient Advocate  [facility]  [phone] (phone) [phone] (fax) 08/05/2023 3:49 PM

## 2023-08-06 ENCOUNTER — Ambulatory Visit
Admission: RE | Admit: 2023-08-06 | Discharge: 2023-08-06 | Disposition: A | Discharge status: Home / Self Care | Payer: BC Managed Care – PPO | Source: Ambulatory Visit | Attending: Radiation Oncology | Admitting: Radiation Oncology

## 2023-08-06 ENCOUNTER — Other Ambulatory Visit (HOSPITAL_COMMUNITY): Payer: Self-pay

## 2023-08-06 DIAGNOSIS — Z510 Encounter for antineoplastic radiation therapy: Secondary | ICD-10-CM | POA: Diagnosis not present

## 2023-08-06 DIAGNOSIS — Z170 Estrogen receptor positive status [ER+]: Secondary | ICD-10-CM | POA: Diagnosis not present

## 2023-08-06 DIAGNOSIS — Z808 Family history of malignant neoplasm of other organs or systems: Secondary | ICD-10-CM | POA: Diagnosis not present

## 2023-08-06 DIAGNOSIS — Z803 Family history of malignant neoplasm of breast: Secondary | ICD-10-CM | POA: Diagnosis not present

## 2023-08-06 DIAGNOSIS — C773 Secondary and unspecified malignant neoplasm of axilla and upper limb lymph nodes: Secondary | ICD-10-CM | POA: Diagnosis not present

## 2023-08-06 DIAGNOSIS — Z1732 Human epidermal growth factor receptor 2 negative status: Secondary | ICD-10-CM | POA: Diagnosis not present

## 2023-08-06 DIAGNOSIS — Z1721 Progesterone receptor positive status: Secondary | ICD-10-CM | POA: Diagnosis not present

## 2023-08-06 DIAGNOSIS — Z79899 Other long term (current) drug therapy: Secondary | ICD-10-CM | POA: Diagnosis not present

## 2023-08-06 DIAGNOSIS — Z806 Family history of leukemia: Secondary | ICD-10-CM | POA: Diagnosis not present

## 2023-08-06 DIAGNOSIS — Z801 Family history of malignant neoplasm of trachea, bronchus and lung: Secondary | ICD-10-CM | POA: Diagnosis not present

## 2023-08-06 DIAGNOSIS — C50412 Malignant neoplasm of upper-outer quadrant of left female breast: Secondary | ICD-10-CM | POA: Diagnosis not present

## 2023-08-07 ENCOUNTER — Other Ambulatory Visit (HOSPITAL_COMMUNITY): Payer: Self-pay

## 2023-08-07 NOTE — Telephone Encounter (Signed)
Oral Oncology Patient Advocate Encounter  Called [facility] to get pt's updated rx billing information. Per insurance pt must fill med at [facility].   Patty Almedia Balls, CPhT Oncology Pharmacy Patient Advocate [facility] Direct Number: [phone] Fax: [phone]

## 2023-08-10 ENCOUNTER — Other Ambulatory Visit: Payer: Self-pay

## 2023-08-10 ENCOUNTER — Ambulatory Visit
Admission: RE | Admit: 2023-08-10 | Discharge: 2023-08-10 | Disposition: A | Discharge status: Home / Self Care | Payer: BC Managed Care – PPO | Source: Ambulatory Visit | Attending: Radiation Oncology | Admitting: Radiation Oncology

## 2023-08-10 ENCOUNTER — Other Ambulatory Visit: Payer: Self-pay | Admitting: Oncology

## 2023-08-10 DIAGNOSIS — C50412 Malignant neoplasm of upper-outer quadrant of left female breast: Secondary | ICD-10-CM | POA: Diagnosis not present

## 2023-08-10 DIAGNOSIS — C773 Secondary and unspecified malignant neoplasm of axilla and upper limb lymph nodes: Secondary | ICD-10-CM | POA: Diagnosis not present

## 2023-08-10 DIAGNOSIS — Z806 Family history of leukemia: Secondary | ICD-10-CM | POA: Diagnosis not present

## 2023-08-10 DIAGNOSIS — Z1732 Human epidermal growth factor receptor 2 negative status: Secondary | ICD-10-CM | POA: Diagnosis not present

## 2023-08-10 DIAGNOSIS — Z170 Estrogen receptor positive status [ER+]: Secondary | ICD-10-CM | POA: Diagnosis not present

## 2023-08-10 DIAGNOSIS — Z803 Family history of malignant neoplasm of breast: Secondary | ICD-10-CM | POA: Diagnosis not present

## 2023-08-10 DIAGNOSIS — Z510 Encounter for antineoplastic radiation therapy: Secondary | ICD-10-CM | POA: Diagnosis not present

## 2023-08-10 DIAGNOSIS — Z808 Family history of malignant neoplasm of other organs or systems: Secondary | ICD-10-CM | POA: Diagnosis not present

## 2023-08-10 DIAGNOSIS — Z801 Family history of malignant neoplasm of trachea, bronchus and lung: Secondary | ICD-10-CM | POA: Diagnosis not present

## 2023-08-10 DIAGNOSIS — Z79899 Other long term (current) drug therapy: Secondary | ICD-10-CM | POA: Diagnosis not present

## 2023-08-10 DIAGNOSIS — Z1721 Progesterone receptor positive status: Secondary | ICD-10-CM | POA: Diagnosis not present

## 2023-08-10 LAB — RAD ONC ARIA SESSION SUMMARY
Course Elapsed Days: 0
Plan Fractions Treated to Date: 1
Plan Prescribed Dose Per Fraction: 1.8 Gy
Plan Total Fractions Prescribed: 28
Plan Total Prescribed Dose: 50.4 Gy
Reference Point Dosage Given to Date: 1.8 Gy
Reference Point Session Dosage Given: 1.8 Gy
Session Number: 1

## 2023-08-11 ENCOUNTER — Other Ambulatory Visit: Payer: Self-pay

## 2023-08-11 ENCOUNTER — Telehealth: Payer: Self-pay | Admitting: *Deleted

## 2023-08-11 ENCOUNTER — Ambulatory Visit
Admission: RE | Admit: 2023-08-11 | Discharge: 2023-08-11 | Disposition: A | Discharge status: Home / Self Care | Payer: BC Managed Care – PPO | Source: Ambulatory Visit | Attending: Radiation Oncology | Admitting: Radiation Oncology

## 2023-08-11 DIAGNOSIS — Z803 Family history of malignant neoplasm of breast: Secondary | ICD-10-CM | POA: Diagnosis not present

## 2023-08-11 DIAGNOSIS — Z1732 Human epidermal growth factor receptor 2 negative status: Secondary | ICD-10-CM | POA: Diagnosis not present

## 2023-08-11 DIAGNOSIS — Z1721 Progesterone receptor positive status: Secondary | ICD-10-CM | POA: Diagnosis not present

## 2023-08-11 DIAGNOSIS — Z808 Family history of malignant neoplasm of other organs or systems: Secondary | ICD-10-CM | POA: Diagnosis not present

## 2023-08-11 DIAGNOSIS — C773 Secondary and unspecified malignant neoplasm of axilla and upper limb lymph nodes: Secondary | ICD-10-CM | POA: Diagnosis not present

## 2023-08-11 DIAGNOSIS — Z801 Family history of malignant neoplasm of trachea, bronchus and lung: Secondary | ICD-10-CM | POA: Diagnosis not present

## 2023-08-11 DIAGNOSIS — Z510 Encounter for antineoplastic radiation therapy: Secondary | ICD-10-CM | POA: Diagnosis not present

## 2023-08-11 DIAGNOSIS — Z79899 Other long term (current) drug therapy: Secondary | ICD-10-CM | POA: Diagnosis not present

## 2023-08-11 DIAGNOSIS — Z806 Family history of leukemia: Secondary | ICD-10-CM | POA: Diagnosis not present

## 2023-08-11 DIAGNOSIS — Z170 Estrogen receptor positive status [ER+]: Secondary | ICD-10-CM | POA: Diagnosis not present

## 2023-08-11 DIAGNOSIS — C50412 Malignant neoplasm of upper-outer quadrant of left female breast: Secondary | ICD-10-CM | POA: Diagnosis not present

## 2023-08-11 LAB — RAD ONC ARIA SESSION SUMMARY
Course Elapsed Days: 1
Plan Fractions Treated to Date: 2
Plan Prescribed Dose Per Fraction: 1.8 Gy
Plan Total Fractions Prescribed: 28
Plan Total Prescribed Dose: 50.4 Gy
Reference Point Dosage Given to Date: 3.6 Gy
Reference Point Session Dosage Given: 1.8 Gy
Session Number: 2

## 2023-08-11 MED ORDER — LETROZOLE 2.5 MG PO TABS: 2.5000 mg | ORAL_TABLET | Freq: Every day | ORAL | 3 refills | Status: DC

## 2023-08-11 NOTE — Telephone Encounter (Signed)
 Error - did not mean to put this phone note

## 2023-08-12 ENCOUNTER — Ambulatory Visit: Payer: BC Managed Care – PPO | Attending: General Surgery | Admitting: Occupational Therapy

## 2023-08-12 ENCOUNTER — Ambulatory Visit
Admission: RE | Admit: 2023-08-12 | Discharge: 2023-08-12 | Disposition: A | Discharge status: Home / Self Care | Payer: BC Managed Care – PPO | Source: Ambulatory Visit | Attending: Radiation Oncology | Admitting: Radiation Oncology

## 2023-08-12 ENCOUNTER — Encounter: Payer: Self-pay | Admitting: *Deleted

## 2023-08-12 ENCOUNTER — Encounter: Payer: Self-pay | Admitting: Occupational Therapy

## 2023-08-12 ENCOUNTER — Other Ambulatory Visit: Payer: Self-pay

## 2023-08-12 DIAGNOSIS — C773 Secondary and unspecified malignant neoplasm of axilla and upper limb lymph nodes: Secondary | ICD-10-CM | POA: Diagnosis not present

## 2023-08-12 DIAGNOSIS — Z808 Family history of malignant neoplasm of other organs or systems: Secondary | ICD-10-CM | POA: Diagnosis not present

## 2023-08-12 DIAGNOSIS — Z510 Encounter for antineoplastic radiation therapy: Secondary | ICD-10-CM | POA: Diagnosis not present

## 2023-08-12 DIAGNOSIS — Z170 Estrogen receptor positive status [ER+]: Secondary | ICD-10-CM | POA: Diagnosis not present

## 2023-08-12 DIAGNOSIS — R600 Localized edema: Secondary | ICD-10-CM

## 2023-08-12 DIAGNOSIS — Z1732 Human epidermal growth factor receptor 2 negative status: Secondary | ICD-10-CM | POA: Diagnosis not present

## 2023-08-12 DIAGNOSIS — C50412 Malignant neoplasm of upper-outer quadrant of left female breast: Secondary | ICD-10-CM | POA: Diagnosis not present

## 2023-08-12 DIAGNOSIS — M25512 Pain in left shoulder: Secondary | ICD-10-CM

## 2023-08-12 DIAGNOSIS — Z801 Family history of malignant neoplasm of trachea, bronchus and lung: Secondary | ICD-10-CM | POA: Diagnosis not present

## 2023-08-12 DIAGNOSIS — Z806 Family history of leukemia: Secondary | ICD-10-CM | POA: Diagnosis not present

## 2023-08-12 DIAGNOSIS — Z803 Family history of malignant neoplasm of breast: Secondary | ICD-10-CM | POA: Diagnosis not present

## 2023-08-12 DIAGNOSIS — Z1721 Progesterone receptor positive status: Secondary | ICD-10-CM | POA: Diagnosis not present

## 2023-08-12 DIAGNOSIS — Z79899 Other long term (current) drug therapy: Secondary | ICD-10-CM | POA: Diagnosis not present

## 2023-08-12 LAB — RAD ONC ARIA SESSION SUMMARY
Course Elapsed Days: 2
Plan Fractions Treated to Date: 3
Plan Prescribed Dose Per Fraction: 1.8 Gy
Plan Total Fractions Prescribed: 28
Plan Total Prescribed Dose: 50.4 Gy
Reference Point Dosage Given to Date: 5.4 Gy
Reference Point Session Dosage Given: 1.8 Gy
Session Number: 3

## 2023-08-12 NOTE — Therapy (Signed)
 OUTPATIENT OCCUPATIONAL THERAPY BREAST CANCER POST OP EVALUATION   Patient Name: Audrey Ball MRN: [id number] DOB:08/01/1970, 53 y.o., female Today's Date: 08/12/2023  END OF SESSION:  OT End of Session - 08/12/23 1512     Visit Number 1    Number of Visits 6    Date for OT Re-Evaluation 09/23/23    OT Start Time 1300    OT Stop Time 1335    OT Time Calculation (min) 35 min    Activity Tolerance Patient tolerated treatment well    Behavior During Therapy [facility] for tasks assessed/performed             Past Medical History:  Diagnosis Date   Breast cancer (HCC)    Hypertension    Past Surgical History:  Procedure Laterality Date   BACK SURGERY  2017   BREAST BIOPSY Left 05/27/2023   u/s bx,mass 10:00, RIBBON clip-path pending   BREAST BIOPSY Left 05/27/2023   u/s bx, axilla/BUTTERFLY clippath pending   BREAST BIOPSY Left 05/27/2023   [place] LT BREAST BX W LOC DEV 1ST LESION IMG BX SPEC US GUIDE 05/27/2023 [facility]   BREAST BIOPSY Left 06/15/2023   [place] LT RADIO FREQUENCY TAG LOC US GUIDE 06/15/2023 [facility]   BREAST BIOPSY Left 06/15/2023   [place] LT RADIO FREQUENCY TAG EA ADD LESION LOC US GUIDE 06/15/2023 [facility]   COLONOSCOPY WITH PROPOFOL N/A 08/16/2021   Procedure: COLONOSCOPY WITH PROPOFOL;  Surgeon: Toney Reil, MD;  Location: [facility];  Service: Gastroenterology;  Laterality: N/A;   GALLBLADDER SURGERY  1999   PART MASTECTOMY,RADIO FREQUENCY LOCALIZER,AXILLARY SENTINEL NODE BIOPSY Left 06/22/2023   Procedure: PART MASTECTOMY,RADIO FREQUENCY LOCALIZER,AXILLARY SENTINEL NODE BIOPSY;  Surgeon: Carolan Shiver, MD;  Location: [facility];  Service: General;  Laterality: Left;   TOTAL VAGINAL HYSTERECTOMY  2016   bilateral salpingectomy at [facility] for abnormal uterine bleeding   Patient Active Problem List   Diagnosis Date Noted   Genetic testing 06/23/2023   Malignant neoplasm of upper-outer quadrant of left breast in female,  estrogen receptor positive (HCC) 06/07/2023   Urinary tract infection with hematuria 05/23/2020   Plantar fasciitis of left foot 11/09/2019   Encounter for general adult medical examination with abnormal findings 05/02/2019   Night muscle spasms 05/02/2019   Chronic bilateral low back pain without sciatica 05/02/2019   Encounter for screening mammogram for malignant neoplasm of breast 05/02/2019   Dysuria 05/02/2019   Eczema 01/30/2019   Food allergy 12/01/2018   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   Candidal vaginitis 06/15/2018   Encounter for long-term (current) use of medications 03/08/2018   Attention deficit disorder of adult 03/08/2018   Fever blister 03/08/2018   Cough 11/02/2015   Spinal stenosis of lumbar region with radiculopathy 11/02/2015   Abnormal thyroid function test 06/27/2014   Abnormal uterine bleeding (AUB) 06/27/2014   Lumbosacral radiculopathy at S1 11/25/2013   Chronic pain of left knee 05/21/2007   Essential hypertension, benign 04/20/2007   Esophagitis 04/20/2007    PCP: Tedd Sias NP  REFERRING PROVIDER: Dr Hazle Quant  REFERRING DIAG: L shoulder pain, stiffness and edema breast and arm  THERAPY DIAG:  Acute pain of left shoulder  Localized edema  Rationale for Evaluation and Treatment: Rehabilitation  ONSET DATE: 06/22/23  SUBJECTIVE:  SUBJECTIVE STATEMENT: I had my surgery in December 24.  I know I did not come to my appointment before.  Left shoulder hurts so bad when I am in that overhead position radiation.  I had this lump or pocket of swelling before but did not bother me.  But I did not reach overhead as much.  PERTINENT HISTORY:  Assessment & Plan : DR Maia Plan January PN Left lumpectomy was 06/22/2023 Breast Cancer with Positive Axillary Lymph Nodes Pathology  revealed three positive nodes and micrometastases. Further pathology results (OncotypeDx) are pending to assess cancer aggressiveness and determine the next treatment steps. Radiation therapy is confirmed, targeting both the breast and axillary area. Additional surgery or chemotherapy is under consideration based on pathology results. If chemotherapy is needed, it will precede radiation therapy. Await pathology results to decide on additional lymph node removal or chemotherapy. A follow-up appointment with the oncologist is scheduled for July 21, 2023, to discuss pathology results and next steps.  Postoperative Care for Left Breast Partial Mastectomy with Axillary Lymph Node Biopsy Two weeks post-operation, healing is progressing well with some soreness and significant intermittent arm swelling. There are no signs of infection, and bruising has improved. Occupational and physical therapy were discussed for swelling and pain management. Referrals to occupational therapy for swelling management and physical therapy for swelling and pain management are provided. A letter for work extension is also provided.   PATIENT GOALS: Want the pain in my shoulder better to tolerate radiation.   PAIN:  Are you having pain?  8 4-10/10 overhead radiation physician  PRECAUTIONS: Recent Surgery, left UE Lymphedema risk,    ACTIVITY LEVEL / LEISURE: Patient is a Naval architect.  Mostly day trips to [place].  Just like crafts but watch mostly TV now.  Not as active because of knee pain   OBJECTIVE:     OBSERVATIONS: Patient with rounded shoulders.  Lump on the anterior left shoulder.  Per patient had that before.  Did not hinder her prior to his radiation.  But appear she did not do a lot of overhead range of motion or use.  Increase swelling in the upper arm.  As well as lymphatic cording in left axilla down into the forearm-causing pull with range of motion  POSTURE:  Rounded shoulders.  LYMPHEDEMA  ASSESSMENT:     LYMPHEDEMA/ONCOLOGY QUESTIONNAIRE - 08/12/23 0001       Right Upper Extremity Lymphedema   15 cm Proximal to Olecranon Process 39 cm    10 cm Proximal to Olecranon Process 36.8 cm    Olecranon Process 28 cm      Left Upper Extremity Lymphedema   15 cm Proximal to Olecranon Process 39.5 cm    10 cm Proximal to Olecranon Process 36.5 cm    Olecranon Process 27 cm             Surgery type/Date: 06/22/23 Number of lymph nodes removed: Two lymph nodes were found in the area where one was expected, and both were positive. An additional lymph node showed micrometastases. In total, five lymph nodes were removed, three of which were positive.  Current/past treatment (chemo, radiation, hormone therapy): Radiation  Other symptoms:  Heaviness/tightness Yes Pain Yes Pitting edema No Infections No Decreased scar mobility Yes with cording in axilla into upper arm down to volar forearm Stemmer sign No  PATIENT EDUCATION: Education details: findings of eval and HEP -anatomy of left shoulder anatomy, motion as well as lymphatics Person educated: Patient Education method: Explanation,  Demonstration, Tactile cues, Verbal cues, and Handouts Education comprehension: verbalized understanding, returned demonstration, verbal cues required, and needs further education   HOME EXERCISE PROGRAM: Done in session today some manual therapy on left forearm and upper arm as well as axilla with about 5 lymphatic cording releases.  With increased motion from 105 to 125 degrees of abduction pain-free. Reviewed with patient manual technique for light traction and left axilla with external rotation of left shoulder with hip rotation to the right. Followed by pulleys for shoulder flexion as well as abduction 20 reps each to 3 times a day especially prior to radiation. Followed by active assisted range of motion in supine for shoulder flexion and abduction.  10-12 reps. Patient with rounded  shoulders patient to perform scapular retraction 10 reps 5 times a day. ASSESSMENT:  CLINICAL IMPRESSION: Patient present 7-1/2 weeks postop left lumpectomy.  With 5 lymph nodes removed of which 3 is positive.  Patient started radiation last week.  Patient was referred mid-January but did not get to therapy.  Patient this date with increased pain in left shoulder 8-10/10 when in radiation position overhead.  Patient report lump or pocket of swelling on anterior left shoulder was present prior to surgery.  But did not bother her.  She did not use left arm overhead as much.  Patient with decreased left shoulder abduction more than flexion.  Patient with lymphatic cording in left axilla into the upper arm and volar forearm.  Was able to release some of her lymphatic cording with increased shoulder abduction by 20 degrees.  Provided patient with home exercise program for soft tissue in left axilla as well as shoulder flexion and abduction using pulleys and  to perform in supine.  Patient to perform home exercises 2-3 times a day especially prior to radiation to decrease stiffness.  Pt will benefit from skilled therapeutic intervention to improve on the following deficits: Decreased knowledge of precautions, impaired UE functional use, pain, decreased ROM, postural dysfunction.   OT treatment/interventions: ADL/Self care home management, [phone]- OT Re-Evaluation, 97110-Therapeutic exercises, 97530- Therapeutic activity, [id number]- Neuromuscular re-education, [phone]- Self Care, 62952- Manual therapy, Patient/Family education, and Scar mobilization   GOALS: Goals reviewed with patient? Yes  LONG TERM GOALS:  (STG=LTG)  GOALS Name Target Date  Goal status  1 Pt will demonstrate she has regained full shoulder AROM and function post operatively compared prior to surgery Baseline: do not have pre surgery baseline- ? if  she had  Full AROM over head -  6 wks INITIAL  2 Pt  use L UE  to perform ADL's and  occupation as truck driver symptoms free  6 wks INITIAL  3 Patient verbalized understanding of signs,  symptoms and prevention for lymphedema   BASELINE: No knowledge-patient had 5 lymph nodes removed.  Has some lymphatic cording in the left axilla.  And having radiation now 6 weeks INITIAL          PLAN:  OT FREQUENCY/DURATION: 1 x wk  for 6 wks        Scar massage You can begin gentle scar massage to you incision sites. Gently place one hand on the incision and move the skin (without sliding on the skin) in various directions. Do this for a few minutes and then you can gently massage either coconut oil or vitamin E cream into the scars.  With left shoulder and external rotation and hip rotation to the right 10 reps  Compression garment Will continue to assess lymphedema signs  and symptoms.  If need preventative sleeve  Home exercise Program Continue doing the exercises you were given until you feel like you can do them without feeling any tightness or pain   Walking Program Studies show that 30 minutes of walking per day (fast enough to elevate your heart rate) can significantly reduce the risk of a cancer recurrence. If you can't walk due to other medical reasons, we encourage you to find another activity you could do (like a stationary bike or water exercise).  Posture After breast cancer surgery, people frequently sit with rounded shoulders posture because it puts their incisions on slack and feels better. If you sit like this and scar tissue forms in that position, you can become very tight and have pain sitting or standing with good posture. Try to be aware of your posture and sit and stand up tall to heal properly.  And do scapular squeezes up to 5 times a day   Oletta Cohn, OTR/L,CLT 08/12/2023, 3:18 PM

## 2023-08-13 ENCOUNTER — Ambulatory Visit
Admission: RE | Admit: 2023-08-13 | Discharge: 2023-08-13 | Disposition: A | Discharge status: Home / Self Care | Payer: BC Managed Care – PPO | Source: Ambulatory Visit | Attending: Radiation Oncology | Admitting: Radiation Oncology

## 2023-08-13 ENCOUNTER — Other Ambulatory Visit: Payer: Self-pay

## 2023-08-13 DIAGNOSIS — Z806 Family history of leukemia: Secondary | ICD-10-CM | POA: Diagnosis not present

## 2023-08-13 DIAGNOSIS — Z808 Family history of malignant neoplasm of other organs or systems: Secondary | ICD-10-CM | POA: Diagnosis not present

## 2023-08-13 DIAGNOSIS — Z1732 Human epidermal growth factor receptor 2 negative status: Secondary | ICD-10-CM | POA: Diagnosis not present

## 2023-08-13 DIAGNOSIS — Z510 Encounter for antineoplastic radiation therapy: Secondary | ICD-10-CM | POA: Diagnosis not present

## 2023-08-13 DIAGNOSIS — Z79899 Other long term (current) drug therapy: Secondary | ICD-10-CM | POA: Diagnosis not present

## 2023-08-13 DIAGNOSIS — C50412 Malignant neoplasm of upper-outer quadrant of left female breast: Secondary | ICD-10-CM | POA: Diagnosis not present

## 2023-08-13 DIAGNOSIS — Z1721 Progesterone receptor positive status: Secondary | ICD-10-CM | POA: Diagnosis not present

## 2023-08-13 DIAGNOSIS — C773 Secondary and unspecified malignant neoplasm of axilla and upper limb lymph nodes: Secondary | ICD-10-CM | POA: Diagnosis not present

## 2023-08-13 DIAGNOSIS — Z801 Family history of malignant neoplasm of trachea, bronchus and lung: Secondary | ICD-10-CM | POA: Diagnosis not present

## 2023-08-13 DIAGNOSIS — Z170 Estrogen receptor positive status [ER+]: Secondary | ICD-10-CM | POA: Diagnosis not present

## 2023-08-13 DIAGNOSIS — Z803 Family history of malignant neoplasm of breast: Secondary | ICD-10-CM | POA: Diagnosis not present

## 2023-08-13 LAB — RAD ONC ARIA SESSION SUMMARY
Course Elapsed Days: 3
Plan Fractions Treated to Date: 4
Plan Prescribed Dose Per Fraction: 1.8 Gy
Plan Total Fractions Prescribed: 28
Plan Total Prescribed Dose: 50.4 Gy
Reference Point Dosage Given to Date: 7.2 Gy
Reference Point Session Dosage Given: 1.8 Gy
Session Number: 4

## 2023-08-14 ENCOUNTER — Other Ambulatory Visit: Payer: Self-pay

## 2023-08-14 ENCOUNTER — Ambulatory Visit
Admission: RE | Admit: 2023-08-14 | Discharge: 2023-08-14 | Disposition: A | Discharge status: Home / Self Care | Payer: BC Managed Care – PPO | Source: Ambulatory Visit | Attending: Radiation Oncology | Admitting: Radiation Oncology

## 2023-08-14 DIAGNOSIS — Z1732 Human epidermal growth factor receptor 2 negative status: Secondary | ICD-10-CM | POA: Diagnosis not present

## 2023-08-14 DIAGNOSIS — C50412 Malignant neoplasm of upper-outer quadrant of left female breast: Secondary | ICD-10-CM | POA: Diagnosis not present

## 2023-08-14 DIAGNOSIS — Z1721 Progesterone receptor positive status: Secondary | ICD-10-CM | POA: Diagnosis not present

## 2023-08-14 DIAGNOSIS — Z510 Encounter for antineoplastic radiation therapy: Secondary | ICD-10-CM | POA: Diagnosis not present

## 2023-08-14 DIAGNOSIS — Z803 Family history of malignant neoplasm of breast: Secondary | ICD-10-CM | POA: Diagnosis not present

## 2023-08-14 DIAGNOSIS — Z801 Family history of malignant neoplasm of trachea, bronchus and lung: Secondary | ICD-10-CM | POA: Diagnosis not present

## 2023-08-14 DIAGNOSIS — Z79899 Other long term (current) drug therapy: Secondary | ICD-10-CM | POA: Diagnosis not present

## 2023-08-14 DIAGNOSIS — C773 Secondary and unspecified malignant neoplasm of axilla and upper limb lymph nodes: Secondary | ICD-10-CM | POA: Diagnosis not present

## 2023-08-14 DIAGNOSIS — Z806 Family history of leukemia: Secondary | ICD-10-CM | POA: Diagnosis not present

## 2023-08-14 DIAGNOSIS — Z808 Family history of malignant neoplasm of other organs or systems: Secondary | ICD-10-CM | POA: Diagnosis not present

## 2023-08-14 DIAGNOSIS — Z170 Estrogen receptor positive status [ER+]: Secondary | ICD-10-CM | POA: Diagnosis not present

## 2023-08-14 LAB — RAD ONC ARIA SESSION SUMMARY
Course Elapsed Days: 4
Plan Fractions Treated to Date: 5
Plan Prescribed Dose Per Fraction: 1.8 Gy
Plan Total Fractions Prescribed: 28
Plan Total Prescribed Dose: 50.4 Gy
Reference Point Dosage Given to Date: 9 Gy
Reference Point Session Dosage Given: 1.8 Gy
Session Number: 5

## 2023-08-14 NOTE — Telephone Encounter (Signed)
 Patient not starting Verzenio until after completion of radiation. I have an education visit schedule with patient mid March. Rx will be sent at that time to [facility].

## 2023-08-17 ENCOUNTER — Other Ambulatory Visit: Payer: Self-pay

## 2023-08-17 ENCOUNTER — Inpatient Hospital Stay: Payer: BC Managed Care – PPO

## 2023-08-17 ENCOUNTER — Encounter: Payer: Self-pay | Admitting: *Deleted

## 2023-08-17 ENCOUNTER — Ambulatory Visit
Admission: RE | Admit: 2023-08-17 | Discharge: 2023-08-17 | Disposition: A | Discharge status: Home / Self Care | Payer: BC Managed Care – PPO | Source: Ambulatory Visit | Attending: Radiation Oncology | Admitting: Radiation Oncology

## 2023-08-17 DIAGNOSIS — Z79899 Other long term (current) drug therapy: Secondary | ICD-10-CM | POA: Diagnosis not present

## 2023-08-17 DIAGNOSIS — Z806 Family history of leukemia: Secondary | ICD-10-CM | POA: Diagnosis not present

## 2023-08-17 DIAGNOSIS — Z1732 Human epidermal growth factor receptor 2 negative status: Secondary | ICD-10-CM | POA: Diagnosis not present

## 2023-08-17 DIAGNOSIS — Z510 Encounter for antineoplastic radiation therapy: Secondary | ICD-10-CM | POA: Diagnosis not present

## 2023-08-17 DIAGNOSIS — C773 Secondary and unspecified malignant neoplasm of axilla and upper limb lymph nodes: Secondary | ICD-10-CM | POA: Diagnosis not present

## 2023-08-17 DIAGNOSIS — Z803 Family history of malignant neoplasm of breast: Secondary | ICD-10-CM | POA: Diagnosis not present

## 2023-08-17 DIAGNOSIS — Z170 Estrogen receptor positive status [ER+]: Secondary | ICD-10-CM | POA: Diagnosis not present

## 2023-08-17 DIAGNOSIS — Z808 Family history of malignant neoplasm of other organs or systems: Secondary | ICD-10-CM | POA: Diagnosis not present

## 2023-08-17 DIAGNOSIS — C50412 Malignant neoplasm of upper-outer quadrant of left female breast: Secondary | ICD-10-CM

## 2023-08-17 DIAGNOSIS — Z1721 Progesterone receptor positive status: Secondary | ICD-10-CM | POA: Diagnosis not present

## 2023-08-17 DIAGNOSIS — Z801 Family history of malignant neoplasm of trachea, bronchus and lung: Secondary | ICD-10-CM | POA: Diagnosis not present

## 2023-08-17 LAB — RAD ONC ARIA SESSION SUMMARY
Course Elapsed Days: 7
Plan Fractions Treated to Date: 6
Plan Prescribed Dose Per Fraction: 1.8 Gy
Plan Total Fractions Prescribed: 28
Plan Total Prescribed Dose: 50.4 Gy
Reference Point Dosage Given to Date: 10.8 Gy
Reference Point Session Dosage Given: 1.8 Gy
Session Number: 6

## 2023-08-17 LAB — CBC (CANCER CENTER ONLY)
HCT: 41.1 % (ref 36.0–46.0)
Hemoglobin: 13.5 g/dL (ref 12.0–15.0)
MCH: 28.1 pg (ref 26.0–34.0)
MCHC: 32.8 g/dL (ref 30.0–36.0)
MCV: 85.4 fL (ref 80.0–100.0)
Platelet Count: 267 10*3/uL (ref 150–400)
RBC: 4.81 MIL/uL (ref 3.87–5.11)
RDW: 13.5 % (ref 11.5–15.5)
WBC Count: 5.5 10*3/uL (ref 4.0–10.5)
nRBC: 0 % (ref 0.0–0.2)

## 2023-08-17 NOTE — Progress Notes (Signed)
 Notified Audrey Ball that her PET scan did not show any metastatic disease and Dr. Smith Robert will discuss the results with her at her next appt.   She is tolerating radiation and has no complaints at this time.

## 2023-08-18 ENCOUNTER — Ambulatory Visit
Admission: RE | Admit: 2023-08-18 | Discharge: 2023-08-18 | Disposition: A | Discharge status: Home / Self Care | Payer: BC Managed Care – PPO | Source: Ambulatory Visit | Attending: Radiation Oncology | Admitting: Radiation Oncology

## 2023-08-18 ENCOUNTER — Other Ambulatory Visit: Payer: Self-pay | Admitting: Nurse Practitioner

## 2023-08-18 ENCOUNTER — Other Ambulatory Visit: Payer: Self-pay

## 2023-08-18 DIAGNOSIS — Z801 Family history of malignant neoplasm of trachea, bronchus and lung: Secondary | ICD-10-CM | POA: Diagnosis not present

## 2023-08-18 DIAGNOSIS — Z170 Estrogen receptor positive status [ER+]: Secondary | ICD-10-CM | POA: Diagnosis not present

## 2023-08-18 DIAGNOSIS — Z1732 Human epidermal growth factor receptor 2 negative status: Secondary | ICD-10-CM | POA: Diagnosis not present

## 2023-08-18 DIAGNOSIS — Z803 Family history of malignant neoplasm of breast: Secondary | ICD-10-CM | POA: Diagnosis not present

## 2023-08-18 DIAGNOSIS — M545 Other chronic pain: Secondary | ICD-10-CM

## 2023-08-18 DIAGNOSIS — Z1721 Progesterone receptor positive status: Secondary | ICD-10-CM | POA: Diagnosis not present

## 2023-08-18 DIAGNOSIS — Z808 Family history of malignant neoplasm of other organs or systems: Secondary | ICD-10-CM | POA: Diagnosis not present

## 2023-08-18 DIAGNOSIS — Z79899 Other long term (current) drug therapy: Secondary | ICD-10-CM | POA: Diagnosis not present

## 2023-08-18 DIAGNOSIS — Z806 Family history of leukemia: Secondary | ICD-10-CM | POA: Diagnosis not present

## 2023-08-18 DIAGNOSIS — C50412 Malignant neoplasm of upper-outer quadrant of left female breast: Secondary | ICD-10-CM | POA: Diagnosis not present

## 2023-08-18 DIAGNOSIS — Z510 Encounter for antineoplastic radiation therapy: Secondary | ICD-10-CM | POA: Diagnosis not present

## 2023-08-18 DIAGNOSIS — C773 Secondary and unspecified malignant neoplasm of axilla and upper limb lymph nodes: Secondary | ICD-10-CM | POA: Diagnosis not present

## 2023-08-18 LAB — RAD ONC ARIA SESSION SUMMARY
Course Elapsed Days: 8
Plan Fractions Treated to Date: 7
Plan Prescribed Dose Per Fraction: 1.8 Gy
Plan Total Fractions Prescribed: 28
Plan Total Prescribed Dose: 50.4 Gy
Reference Point Dosage Given to Date: 12.6 Gy
Reference Point Session Dosage Given: 1.8 Gy
Session Number: 7

## 2023-08-18 NOTE — Telephone Encounter (Signed)
 Please review she already other pain med

## 2023-08-19 ENCOUNTER — Other Ambulatory Visit: Payer: Self-pay

## 2023-08-19 ENCOUNTER — Ambulatory Visit
Admission: RE | Admit: 2023-08-19 | Discharge: 2023-08-19 | Disposition: A | Discharge status: Home / Self Care | Payer: BC Managed Care – PPO | Source: Ambulatory Visit | Attending: Radiation Oncology | Admitting: Radiation Oncology

## 2023-08-19 ENCOUNTER — Ambulatory Visit: Payer: BC Managed Care – PPO | Admitting: Occupational Therapy

## 2023-08-19 DIAGNOSIS — C773 Secondary and unspecified malignant neoplasm of axilla and upper limb lymph nodes: Secondary | ICD-10-CM | POA: Diagnosis not present

## 2023-08-19 DIAGNOSIS — Z170 Estrogen receptor positive status [ER+]: Secondary | ICD-10-CM | POA: Diagnosis not present

## 2023-08-19 DIAGNOSIS — R600 Localized edema: Secondary | ICD-10-CM | POA: Diagnosis not present

## 2023-08-19 DIAGNOSIS — Z801 Family history of malignant neoplasm of trachea, bronchus and lung: Secondary | ICD-10-CM | POA: Diagnosis not present

## 2023-08-19 DIAGNOSIS — Z79899 Other long term (current) drug therapy: Secondary | ICD-10-CM | POA: Diagnosis not present

## 2023-08-19 DIAGNOSIS — Z808 Family history of malignant neoplasm of other organs or systems: Secondary | ICD-10-CM | POA: Diagnosis not present

## 2023-08-19 DIAGNOSIS — C50412 Malignant neoplasm of upper-outer quadrant of left female breast: Secondary | ICD-10-CM | POA: Diagnosis not present

## 2023-08-19 DIAGNOSIS — Z803 Family history of malignant neoplasm of breast: Secondary | ICD-10-CM | POA: Diagnosis not present

## 2023-08-19 DIAGNOSIS — M25512 Pain in left shoulder: Secondary | ICD-10-CM

## 2023-08-19 DIAGNOSIS — Z806 Family history of leukemia: Secondary | ICD-10-CM | POA: Diagnosis not present

## 2023-08-19 DIAGNOSIS — Z1721 Progesterone receptor positive status: Secondary | ICD-10-CM | POA: Diagnosis not present

## 2023-08-19 DIAGNOSIS — Z510 Encounter for antineoplastic radiation therapy: Secondary | ICD-10-CM | POA: Diagnosis not present

## 2023-08-19 DIAGNOSIS — Z1732 Human epidermal growth factor receptor 2 negative status: Secondary | ICD-10-CM | POA: Diagnosis not present

## 2023-08-19 LAB — RAD ONC ARIA SESSION SUMMARY
Course Elapsed Days: 9
Plan Fractions Treated to Date: 8
Plan Prescribed Dose Per Fraction: 1.8 Gy
Plan Total Fractions Prescribed: 28
Plan Total Prescribed Dose: 50.4 Gy
Reference Point Dosage Given to Date: 14.4 Gy
Reference Point Session Dosage Given: 1.8 Gy
Session Number: 8

## 2023-08-19 NOTE — Therapy (Signed)
 OUTPATIENT OCCUPATIONAL THERAPY BREAST CANCER POST OP TREATMENT   Patient Name: Audrey Ball MRN: [id number] DOB:[birth year], 53 y.o., female Today's Date: 08/19/2023  END OF SESSION:  OT End of Session - 08/19/23 1908     Visit Number 2    Number of Visits 6    Date for OT Re-Evaluation 09/23/23    OT Start Time 1432    OT Stop Time 1502    OT Time Calculation (min) 30 min    Activity Tolerance Patient tolerated treatment well    Behavior During Therapy [facility] for tasks assessed/performed             Past Medical History:  Diagnosis Date   Breast cancer (HCC)    Hypertension    Past Surgical History:  Procedure Laterality Date   BACK SURGERY  2017   BREAST BIOPSY Left 05/27/2023   u/s bx,mass 10:00, RIBBON clip-path pending   BREAST BIOPSY Left 05/27/2023   u/s bx, axilla/BUTTERFLY clippath pending   BREAST BIOPSY Left 05/27/2023   [place] LT BREAST BX W LOC DEV 1ST LESION IMG BX SPEC US GUIDE 05/27/2023 [facility]   BREAST BIOPSY Left 06/15/2023   [place] LT RADIO FREQUENCY TAG LOC US GUIDE 06/15/2023 [facility]   BREAST BIOPSY Left 06/15/2023   [place] LT RADIO FREQUENCY TAG EA ADD LESION LOC US GUIDE 06/15/2023 [facility]   COLONOSCOPY WITH PROPOFOL N/A 08/16/2021   Procedure: COLONOSCOPY WITH PROPOFOL;  Surgeon: Toney Reil, MD;  Location: [facility];  Service: Gastroenterology;  Laterality: N/A;   GALLBLADDER SURGERY  1999   PART MASTECTOMY,RADIO FREQUENCY LOCALIZER,AXILLARY SENTINEL NODE BIOPSY Left 06/22/2023   Procedure: PART MASTECTOMY,RADIO FREQUENCY LOCALIZER,AXILLARY SENTINEL NODE BIOPSY;  Surgeon: Carolan Shiver, MD;  Location: [facility];  Service: General;  Laterality: Left;   TOTAL VAGINAL HYSTERECTOMY  2016   bilateral salpingectomy at [facility] for abnormal uterine bleeding   Patient Active Problem List   Diagnosis Date Noted   Genetic testing 06/23/2023   Malignant neoplasm of upper-outer quadrant of left breast in female,  estrogen receptor positive (HCC) 06/07/2023   Urinary tract infection with hematuria 05/23/2020   Plantar fasciitis of left foot 11/09/2019   Encounter for general adult medical examination with abnormal findings 05/02/2019   Night muscle spasms 05/02/2019   Chronic bilateral low back pain without sciatica 05/02/2019   Encounter for screening mammogram for malignant neoplasm of breast 05/02/2019   Dysuria 05/02/2019   Eczema 01/30/2019   Food allergy 12/01/2018   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   Candidal vaginitis 06/15/2018   Encounter for long-term (current) use of medications 03/08/2018   Attention deficit disorder of adult 03/08/2018   Fever blister 03/08/2018   Cough 11/02/2015   Spinal stenosis of lumbar region with radiculopathy 11/02/2015   Abnormal thyroid function test 06/27/2014   Abnormal uterine bleeding (AUB) 06/27/2014   Lumbosacral radiculopathy at S1 11/25/2013   Chronic pain of left knee 05/21/2007   Essential hypertension, benign 04/20/2007   Esophagitis 04/20/2007    PCP: Tedd Sias NP  REFERRING PROVIDER: Dr Hazle Quant  REFERRING DIAG: L shoulder pain, stiffness and edema breast and arm  THERAPY DIAG:  Acute pain of left shoulder  Localized edema  Rationale for Evaluation and Treatment: Rehabilitation  ONSET DATE: 06/22/23  SUBJECTIVE:  SUBJECTIVE STATEMENT: I had less pain last Thursday and Friday during radiation.  Over the weekend left shoulder was good.  But then it hurt again Monday and yesterday and today.  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  I left about 1 AM this morning.  I did not do my exercises or my pulleys before I had radiation. PERTINENT HISTORY:  Assessment & Plan : DR Maia Plan January PN Left lumpectomy was 06/22/2023 Breast Cancer with Positive  Axillary Lymph Nodes Pathology revealed three positive nodes and micrometastases. Further pathology results (OncotypeDx) are pending to assess cancer aggressiveness and determine the next treatment steps. Radiation therapy is confirmed, targeting both the breast and axillary area. Additional surgery or chemotherapy is under consideration based on pathology results. If chemotherapy is needed, it will precede radiation therapy. Await pathology results to decide on additional lymph node removal or chemotherapy. A follow-up appointment with the oncologist is scheduled for July 21, 2023, to discuss pathology results and next steps.  Postoperative Care for Left Breast Partial Mastectomy with Axillary Lymph Node Biopsy Two weeks post-operation, healing is progressing well with some soreness and significant intermittent arm swelling. There are no signs of infection, and bruising has improved. Occupational and physical therapy were discussed for swelling and pain management. Referrals to occupational therapy for swelling management and physical therapy for swelling and pain management are provided. A letter for work extension is also provided.   PATIENT GOALS: Want the pain in my shoulder better to tolerate radiation.   PAIN:  Are you having pain?  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  Just like crafts but watch mostly TV now.  Not as active because of knee pain   OBJECTIVE:     OBSERVATIONS: Patient with rounded shoulders.  Lump on the anterior left shoulder.  Per patient had that before.  Did not hinder her prior to his radiation.  But appear she did not do a lot of overhead range of motion or use.  Increase swelling in the upper arm.  As well as lymphatic cording in left axilla down into the forearm-causing pull with range of motion  POSTURE:   Rounded shoulders.  LYMPHEDEMA ASSESSMENT:       Surgery type/Date: 06/22/23 Number of lymph nodes removed: Two lymph nodes were found in the area where one was expected, and both were positive. An additional lymph node showed micrometastases. In total, five lymph nodes were removed, three of which were positive.  Current/past treatment (chemo, radiation, hormone therapy): Radiation  Other symptoms:  Heaviness/tightness Yes Pain Yes Pitting edema No Infections No Decreased scar mobility Yes with cording in axilla into upper arm down to volar forearm Stemmer sign No  PATIENT EDUCATION: Education details: findings of eval and HEP -anatomy of left shoulder anatomy, motion as well as lymphatics Person educated: Patient Education method: Explanation, Demonstration, Tactile cues, Verbal cues, and Handouts Education comprehension: verbalized understanding, returned demonstration, verbal cues required, and needs further education   HOME EXERCISE PROGRAM: Patient arrived today with increased shoulder flexion and abduction compared to last time.  But continued to have this week pain again and radiation position.  Patient pain was better after last session. Appear patient did not continue with home exercises over the weekend as well as prior to her radiation this week. Upon assessment for shoulder abduction patient had continue lymphatic cording in  elbow and forearm.  As well as still 1 lymphatic cord in axilla.  Was able to get 3 lymphatic cording releases elbow into forearm.  With increased motion in external rotation and shoulder abduction Reviewed again with patient manual technique for light traction and left axilla with external rotation of left shoulder with hip rotation to the right. Followed by pulleys for shoulder flexion as well as abduction 20 reps each to 3 times a day especially prior to radiation.  Reviewed with patient again as well as paying attention to keeping shoulder down  and not activating upper trap.  Needed some tactile cueing. Followed by active assisted range of motion in supine for shoulder flexion and abduction.  10-12 reps. With less pain. Patient with rounded shoulders-patient started driving a truck again today. -Reviewed with patient patient to perform scapular retraction 10 reps 5 times a day. ASSESSMENT:  CLINICAL IMPRESSION: Patient present 8-1/2 weeks postop left lumpectomy.  With 5 lymph nodes removed of which 3 is positive.  Patient started radiation last week.  Patient was referred mid-January but did not get to therapy.  Patient this date with increased pain in left shoulder 8-10/10 when in radiation position overhead.  Patient report lump or pocket of swelling on anterior left shoulder was present prior to surgery.  But did not bother her.  She did not use left arm overhead as much.  Patient returned this date with increased active range of motion for flexion abduction as well as external rotation.  Appears since last visit patient had less pain last week with radiation but the last 2 to 3 days increased discomfort again.  Appear patient did not follow through with home exercises over the weekend and this week.  Patient with decreased left shoulder abduction more than flexion.  Patient lymphatic cording in left axilla into the upper arm and volar forearm better the last time but done 3 releases in the elbow to forearm.  Reviewed with patient again home exercises to release cording in right axilla as well as reviewed home exercise program  shoulder flexion and abduction using pulleys prior to radiation .  Patient to perform home exercises 2-3 times a day especially prior to radiation to decrease stiffness.  Patient with less pain at the end of session.  Pt will benefit from skilled therapeutic intervention to improve on the following deficits: Decreased knowledge of precautions, impaired UE functional use, pain, decreased ROM, postural dysfunction.   OT  treatment/interventions: ADL/Self care home management, [phone]- OT Re-Evaluation, 97110-Therapeutic exercises, 97530- Therapeutic activity, [id number]- Neuromuscular re-education, [phone]- Self Care, 09811- Manual therapy, Patient/Family education, and Scar mobilization   GOALS: Goals reviewed with patient? Yes  LONG TERM GOALS:  (STG=LTG)  GOALS Name Target Date  Goal status  1 Pt will demonstrate she has regained full shoulder AROM and function post operatively compared prior to surgery Baseline: do not have pre surgery baseline- ? if  she had  Full AROM over head -  6 wks INITIAL  2 Pt  use L UE  to perform ADL's and occupation as truck driver symptoms free  6 wks INITIAL  3 Patient verbalized understanding of signs,  symptoms and prevention for lymphedema   BASELINE: No knowledge-patient had 5 lymph nodes removed.  Has some lymphatic cording in the left axilla.  And having radiation now 6 weeks INITIAL          PLAN:  OT FREQUENCY/DURATION: 1 x wk  for 6 wks  Scar massage You can begin gentle scar massage to you incision sites. Gently place one hand on the incision and move the skin (without sliding on the skin) in various directions. Do this for a few minutes and then you can gently massage either coconut oil or vitamin E cream into the scars.  With left shoulder and external rotation and hip rotation to the right 10 reps  Compression garment Will continue to assess lymphedema signs and symptoms.  If need preventative sleeve  Home exercise Program Continue doing the exercises you were given until you feel like you can do them without feeling any tightness or pain   Walking Program Studies show that 30 minutes of walking per day (fast enough to elevate your heart rate) can significantly reduce the risk of a cancer recurrence. If you can't walk due to other medical reasons, we encourage you to find another activity you could do (like a stationary bike or water  exercise).  Posture After breast cancer surgery, people frequently sit with rounded shoulders posture because it puts their incisions on slack and feels better. If you sit like this and scar tissue forms in that position, you can become very tight and have pain sitting or standing with good posture. Try to be aware of your posture and sit and stand up tall to heal properly.  And do scapular squeezes up to 5 times a day   Oletta Cohn, OTR/L,CLT 08/19/2023, 7:09 PM

## 2023-08-20 ENCOUNTER — Ambulatory Visit
Admission: RE | Admit: 2023-08-20 | Discharge: 2023-08-20 | Disposition: A | Discharge status: Home / Self Care | Payer: BC Managed Care – PPO | Source: Ambulatory Visit | Attending: Radiation Oncology | Admitting: Radiation Oncology

## 2023-08-20 ENCOUNTER — Other Ambulatory Visit: Payer: Self-pay

## 2023-08-20 DIAGNOSIS — Z1721 Progesterone receptor positive status: Secondary | ICD-10-CM | POA: Diagnosis not present

## 2023-08-20 DIAGNOSIS — Z808 Family history of malignant neoplasm of other organs or systems: Secondary | ICD-10-CM | POA: Diagnosis not present

## 2023-08-20 DIAGNOSIS — Z801 Family history of malignant neoplasm of trachea, bronchus and lung: Secondary | ICD-10-CM | POA: Diagnosis not present

## 2023-08-20 DIAGNOSIS — Z806 Family history of leukemia: Secondary | ICD-10-CM | POA: Diagnosis not present

## 2023-08-20 DIAGNOSIS — C50412 Malignant neoplasm of upper-outer quadrant of left female breast: Secondary | ICD-10-CM | POA: Diagnosis not present

## 2023-08-20 DIAGNOSIS — Z803 Family history of malignant neoplasm of breast: Secondary | ICD-10-CM | POA: Diagnosis not present

## 2023-08-20 DIAGNOSIS — C773 Secondary and unspecified malignant neoplasm of axilla and upper limb lymph nodes: Secondary | ICD-10-CM | POA: Diagnosis not present

## 2023-08-20 DIAGNOSIS — Z79899 Other long term (current) drug therapy: Secondary | ICD-10-CM | POA: Diagnosis not present

## 2023-08-20 DIAGNOSIS — Z1732 Human epidermal growth factor receptor 2 negative status: Secondary | ICD-10-CM | POA: Diagnosis not present

## 2023-08-20 DIAGNOSIS — Z170 Estrogen receptor positive status [ER+]: Secondary | ICD-10-CM | POA: Diagnosis not present

## 2023-08-20 DIAGNOSIS — Z510 Encounter for antineoplastic radiation therapy: Secondary | ICD-10-CM | POA: Diagnosis not present

## 2023-08-20 LAB — RAD ONC ARIA SESSION SUMMARY
Course Elapsed Days: 10
Plan Fractions Treated to Date: 9
Plan Prescribed Dose Per Fraction: 1.8 Gy
Plan Total Fractions Prescribed: 28
Plan Total Prescribed Dose: 50.4 Gy
Reference Point Dosage Given to Date: 16.2 Gy
Reference Point Session Dosage Given: 1.8 Gy
Session Number: 9

## 2023-08-21 ENCOUNTER — Ambulatory Visit
Admission: RE | Admit: 2023-08-21 | Discharge: 2023-08-21 | Disposition: A | Discharge status: Home / Self Care | Payer: BC Managed Care – PPO | Source: Ambulatory Visit | Attending: Radiation Oncology | Admitting: Radiation Oncology

## 2023-08-21 ENCOUNTER — Other Ambulatory Visit: Payer: Self-pay

## 2023-08-21 DIAGNOSIS — C50412 Malignant neoplasm of upper-outer quadrant of left female breast: Secondary | ICD-10-CM | POA: Diagnosis not present

## 2023-08-21 DIAGNOSIS — Z1721 Progesterone receptor positive status: Secondary | ICD-10-CM | POA: Diagnosis not present

## 2023-08-21 DIAGNOSIS — Z79899 Other long term (current) drug therapy: Secondary | ICD-10-CM | POA: Diagnosis not present

## 2023-08-21 DIAGNOSIS — Z170 Estrogen receptor positive status [ER+]: Secondary | ICD-10-CM | POA: Diagnosis not present

## 2023-08-21 DIAGNOSIS — Z1732 Human epidermal growth factor receptor 2 negative status: Secondary | ICD-10-CM | POA: Diagnosis not present

## 2023-08-21 DIAGNOSIS — Z801 Family history of malignant neoplasm of trachea, bronchus and lung: Secondary | ICD-10-CM | POA: Diagnosis not present

## 2023-08-21 DIAGNOSIS — Z510 Encounter for antineoplastic radiation therapy: Secondary | ICD-10-CM | POA: Diagnosis not present

## 2023-08-21 DIAGNOSIS — Z803 Family history of malignant neoplasm of breast: Secondary | ICD-10-CM | POA: Diagnosis not present

## 2023-08-21 DIAGNOSIS — Z806 Family history of leukemia: Secondary | ICD-10-CM | POA: Diagnosis not present

## 2023-08-21 DIAGNOSIS — Z808 Family history of malignant neoplasm of other organs or systems: Secondary | ICD-10-CM | POA: Diagnosis not present

## 2023-08-21 DIAGNOSIS — C773 Secondary and unspecified malignant neoplasm of axilla and upper limb lymph nodes: Secondary | ICD-10-CM | POA: Diagnosis not present

## 2023-08-21 LAB — RAD ONC ARIA SESSION SUMMARY
Course Elapsed Days: 11
Plan Fractions Treated to Date: 10
Plan Prescribed Dose Per Fraction: 1.8 Gy
Plan Total Fractions Prescribed: 28
Plan Total Prescribed Dose: 50.4 Gy
Reference Point Dosage Given to Date: 18 Gy
Reference Point Session Dosage Given: 1.8 Gy
Session Number: 10

## 2023-08-24 ENCOUNTER — Ambulatory Visit
Admission: RE | Admit: 2023-08-24 | Discharge: 2023-08-24 | Disposition: A | Discharge status: Home / Self Care | Payer: BC Managed Care – PPO | Source: Ambulatory Visit | Attending: Radiation Oncology | Admitting: Radiation Oncology

## 2023-08-24 ENCOUNTER — Other Ambulatory Visit: Payer: Self-pay

## 2023-08-24 DIAGNOSIS — Z515 Encounter for palliative care: Secondary | ICD-10-CM | POA: Insufficient documentation

## 2023-08-24 DIAGNOSIS — Z510 Encounter for antineoplastic radiation therapy: Secondary | ICD-10-CM | POA: Diagnosis not present

## 2023-08-24 DIAGNOSIS — Z79811 Long term (current) use of aromatase inhibitors: Secondary | ICD-10-CM | POA: Insufficient documentation

## 2023-08-24 DIAGNOSIS — C50412 Malignant neoplasm of upper-outer quadrant of left female breast: Secondary | ICD-10-CM | POA: Insufficient documentation

## 2023-08-24 DIAGNOSIS — Z170 Estrogen receptor positive status [ER+]: Secondary | ICD-10-CM | POA: Diagnosis not present

## 2023-08-24 LAB — RAD ONC ARIA SESSION SUMMARY
Course Elapsed Days: 14
Plan Fractions Treated to Date: 11
Plan Prescribed Dose Per Fraction: 1.8 Gy
Plan Total Fractions Prescribed: 28
Plan Total Prescribed Dose: 50.4 Gy
Reference Point Dosage Given to Date: 19.8 Gy
Reference Point Session Dosage Given: 1.8 Gy
Session Number: 11

## 2023-08-25 ENCOUNTER — Other Ambulatory Visit: Payer: Self-pay

## 2023-08-25 ENCOUNTER — Ambulatory Visit: Payer: BC Managed Care – PPO | Attending: General Surgery | Admitting: Occupational Therapy

## 2023-08-25 ENCOUNTER — Ambulatory Visit
Admission: RE | Admit: 2023-08-25 | Discharge: 2023-08-25 | Disposition: A | Discharge status: Home / Self Care | Payer: BC Managed Care – PPO | Source: Ambulatory Visit | Attending: Radiation Oncology | Admitting: Radiation Oncology

## 2023-08-25 DIAGNOSIS — Z170 Estrogen receptor positive status [ER+]: Secondary | ICD-10-CM | POA: Diagnosis not present

## 2023-08-25 DIAGNOSIS — Z510 Encounter for antineoplastic radiation therapy: Secondary | ICD-10-CM | POA: Diagnosis not present

## 2023-08-25 DIAGNOSIS — C50412 Malignant neoplasm of upper-outer quadrant of left female breast: Secondary | ICD-10-CM | POA: Diagnosis not present

## 2023-08-25 DIAGNOSIS — Z79811 Long term (current) use of aromatase inhibitors: Secondary | ICD-10-CM | POA: Diagnosis not present

## 2023-08-25 LAB — RAD ONC ARIA SESSION SUMMARY
Course Elapsed Days: 15
Plan Fractions Treated to Date: 12
Plan Prescribed Dose Per Fraction: 1.8 Gy
Plan Total Fractions Prescribed: 28
Plan Total Prescribed Dose: 50.4 Gy
Reference Point Dosage Given to Date: 21.6 Gy
Reference Point Session Dosage Given: 1.8 Gy
Session Number: 12

## 2023-08-26 ENCOUNTER — Other Ambulatory Visit: Payer: Self-pay

## 2023-08-26 ENCOUNTER — Ambulatory Visit
Admission: RE | Admit: 2023-08-26 | Discharge: 2023-08-26 | Disposition: A | Discharge status: Home / Self Care | Payer: BC Managed Care – PPO | Source: Ambulatory Visit | Attending: Radiation Oncology | Admitting: Radiation Oncology

## 2023-08-26 DIAGNOSIS — Z170 Estrogen receptor positive status [ER+]: Secondary | ICD-10-CM | POA: Diagnosis not present

## 2023-08-26 DIAGNOSIS — Z510 Encounter for antineoplastic radiation therapy: Secondary | ICD-10-CM | POA: Diagnosis not present

## 2023-08-26 DIAGNOSIS — C50412 Malignant neoplasm of upper-outer quadrant of left female breast: Secondary | ICD-10-CM | POA: Diagnosis not present

## 2023-08-26 DIAGNOSIS — Z79811 Long term (current) use of aromatase inhibitors: Secondary | ICD-10-CM | POA: Diagnosis not present

## 2023-08-26 LAB — RAD ONC ARIA SESSION SUMMARY
Course Elapsed Days: 16
Plan Fractions Treated to Date: 13
Plan Prescribed Dose Per Fraction: 1.8 Gy
Plan Total Fractions Prescribed: 28
Plan Total Prescribed Dose: 50.4 Gy
Reference Point Dosage Given to Date: 23.4 Gy
Reference Point Session Dosage Given: 1.8 Gy
Session Number: 13

## 2023-08-27 ENCOUNTER — Ambulatory Visit
Admission: RE | Admit: 2023-08-27 | Discharge: 2023-08-27 | Disposition: A | Discharge status: Home / Self Care | Payer: BC Managed Care – PPO | Source: Ambulatory Visit | Attending: Radiation Oncology | Admitting: Radiation Oncology

## 2023-08-27 ENCOUNTER — Other Ambulatory Visit: Payer: Self-pay

## 2023-08-27 DIAGNOSIS — Z170 Estrogen receptor positive status [ER+]: Secondary | ICD-10-CM | POA: Diagnosis not present

## 2023-08-27 DIAGNOSIS — Z510 Encounter for antineoplastic radiation therapy: Secondary | ICD-10-CM | POA: Diagnosis not present

## 2023-08-27 DIAGNOSIS — Z79811 Long term (current) use of aromatase inhibitors: Secondary | ICD-10-CM | POA: Diagnosis not present

## 2023-08-27 DIAGNOSIS — C50412 Malignant neoplasm of upper-outer quadrant of left female breast: Secondary | ICD-10-CM | POA: Diagnosis not present

## 2023-08-27 LAB — RAD ONC ARIA SESSION SUMMARY
Course Elapsed Days: 17
Plan Fractions Treated to Date: 14
Plan Prescribed Dose Per Fraction: 1.8 Gy
Plan Total Fractions Prescribed: 28
Plan Total Prescribed Dose: 50.4 Gy
Reference Point Dosage Given to Date: 25.2 Gy
Reference Point Session Dosage Given: 1.8 Gy
Session Number: 14

## 2023-08-28 ENCOUNTER — Other Ambulatory Visit: Payer: Self-pay

## 2023-08-28 ENCOUNTER — Ambulatory Visit
Admission: RE | Admit: 2023-08-28 | Discharge: 2023-08-28 | Disposition: A | Discharge status: Home / Self Care | Payer: BC Managed Care – PPO | Source: Ambulatory Visit | Attending: Radiation Oncology | Admitting: Radiation Oncology

## 2023-08-28 DIAGNOSIS — C50412 Malignant neoplasm of upper-outer quadrant of left female breast: Secondary | ICD-10-CM | POA: Diagnosis not present

## 2023-08-28 DIAGNOSIS — Z79811 Long term (current) use of aromatase inhibitors: Secondary | ICD-10-CM | POA: Diagnosis not present

## 2023-08-28 DIAGNOSIS — Z510 Encounter for antineoplastic radiation therapy: Secondary | ICD-10-CM | POA: Diagnosis not present

## 2023-08-28 DIAGNOSIS — Z170 Estrogen receptor positive status [ER+]: Secondary | ICD-10-CM | POA: Diagnosis not present

## 2023-08-28 LAB — RAD ONC ARIA SESSION SUMMARY
Course Elapsed Days: 18
Plan Fractions Treated to Date: 15
Plan Prescribed Dose Per Fraction: 1.8 Gy
Plan Total Fractions Prescribed: 28
Plan Total Prescribed Dose: 50.4 Gy
Reference Point Dosage Given to Date: 27 Gy
Reference Point Session Dosage Given: 1.8 Gy
Session Number: 15

## 2023-08-31 ENCOUNTER — Other Ambulatory Visit: Payer: Self-pay

## 2023-08-31 ENCOUNTER — Inpatient Hospital Stay: Payer: BC Managed Care – PPO

## 2023-08-31 ENCOUNTER — Ambulatory Visit
Admission: RE | Admit: 2023-08-31 | Discharge: 2023-08-31 | Disposition: A | Discharge status: Home / Self Care | Payer: BC Managed Care – PPO | Source: Ambulatory Visit | Attending: Radiation Oncology | Admitting: Radiation Oncology

## 2023-08-31 DIAGNOSIS — Z79811 Long term (current) use of aromatase inhibitors: Secondary | ICD-10-CM | POA: Insufficient documentation

## 2023-08-31 DIAGNOSIS — C50412 Malignant neoplasm of upper-outer quadrant of left female breast: Secondary | ICD-10-CM | POA: Insufficient documentation

## 2023-08-31 DIAGNOSIS — Z170 Estrogen receptor positive status [ER+]: Secondary | ICD-10-CM | POA: Insufficient documentation

## 2023-08-31 DIAGNOSIS — Z510 Encounter for antineoplastic radiation therapy: Secondary | ICD-10-CM | POA: Diagnosis not present

## 2023-08-31 LAB — RAD ONC ARIA SESSION SUMMARY
Course Elapsed Days: 21
Plan Fractions Treated to Date: 16
Plan Prescribed Dose Per Fraction: 1.8 Gy
Plan Total Fractions Prescribed: 28
Plan Total Prescribed Dose: 50.4 Gy
Reference Point Dosage Given to Date: 28.8 Gy
Reference Point Session Dosage Given: 1.8 Gy
Session Number: 16

## 2023-08-31 LAB — CBC (CANCER CENTER ONLY)
HCT: 39.8 % (ref 36.0–46.0)
Hemoglobin: 13.2 g/dL (ref 12.0–15.0)
MCH: 28.4 pg (ref 26.0–34.0)
MCHC: 33.2 g/dL (ref 30.0–36.0)
MCV: 85.8 fL (ref 80.0–100.0)
Platelet Count: 249 10*3/uL (ref 150–400)
RBC: 4.64 MIL/uL (ref 3.87–5.11)
RDW: 13.6 % (ref 11.5–15.5)
WBC Count: 4.7 10*3/uL (ref 4.0–10.5)
nRBC: 0 % (ref 0.0–0.2)

## 2023-09-01 ENCOUNTER — Ambulatory Visit
Admission: RE | Admit: 2023-09-01 | Discharge: 2023-09-01 | Disposition: A | Discharge status: Home / Self Care | Payer: BC Managed Care – PPO | Source: Ambulatory Visit | Attending: Radiation Oncology | Admitting: Radiation Oncology

## 2023-09-01 ENCOUNTER — Other Ambulatory Visit: Payer: Self-pay

## 2023-09-01 DIAGNOSIS — Z170 Estrogen receptor positive status [ER+]: Secondary | ICD-10-CM | POA: Diagnosis not present

## 2023-09-01 DIAGNOSIS — Z79811 Long term (current) use of aromatase inhibitors: Secondary | ICD-10-CM | POA: Diagnosis not present

## 2023-09-01 DIAGNOSIS — Z510 Encounter for antineoplastic radiation therapy: Secondary | ICD-10-CM | POA: Diagnosis not present

## 2023-09-01 DIAGNOSIS — C50412 Malignant neoplasm of upper-outer quadrant of left female breast: Secondary | ICD-10-CM | POA: Diagnosis not present

## 2023-09-01 LAB — RAD ONC ARIA SESSION SUMMARY
Course Elapsed Days: 22
Plan Fractions Treated to Date: 1
Plan Prescribed Dose Per Fraction: 1.8 Gy
Plan Total Fractions Prescribed: 12
Plan Total Prescribed Dose: 21.6 Gy
Reference Point Dosage Given to Date: 30.6 Gy
Reference Point Session Dosage Given: 1.8 Gy
Session Number: 17

## 2023-09-02 ENCOUNTER — Other Ambulatory Visit: Payer: Self-pay

## 2023-09-02 ENCOUNTER — Ambulatory Visit
Admission: RE | Admit: 2023-09-02 | Discharge: 2023-09-02 | Disposition: A | Discharge status: Home / Self Care | Payer: BC Managed Care – PPO | Source: Ambulatory Visit | Attending: Radiation Oncology | Admitting: Radiation Oncology

## 2023-09-02 DIAGNOSIS — Z510 Encounter for antineoplastic radiation therapy: Secondary | ICD-10-CM | POA: Diagnosis not present

## 2023-09-02 DIAGNOSIS — Z170 Estrogen receptor positive status [ER+]: Secondary | ICD-10-CM | POA: Diagnosis not present

## 2023-09-02 DIAGNOSIS — C50412 Malignant neoplasm of upper-outer quadrant of left female breast: Secondary | ICD-10-CM | POA: Diagnosis not present

## 2023-09-02 DIAGNOSIS — Z79811 Long term (current) use of aromatase inhibitors: Secondary | ICD-10-CM | POA: Diagnosis not present

## 2023-09-02 LAB — RAD ONC ARIA SESSION SUMMARY
Course Elapsed Days: 23
Plan Fractions Treated to Date: 2
Plan Prescribed Dose Per Fraction: 1.8 Gy
Plan Total Fractions Prescribed: 12
Plan Total Prescribed Dose: 21.6 Gy
Reference Point Dosage Given to Date: 32.4 Gy
Reference Point Session Dosage Given: 1.8 Gy
Session Number: 18

## 2023-09-03 ENCOUNTER — Ambulatory Visit
Admission: RE | Admit: 2023-09-03 | Discharge: 2023-09-03 | Disposition: A | Discharge status: Home / Self Care | Payer: BC Managed Care – PPO | Source: Ambulatory Visit | Attending: Radiation Oncology | Admitting: Radiation Oncology

## 2023-09-03 ENCOUNTER — Other Ambulatory Visit: Payer: Self-pay

## 2023-09-03 DIAGNOSIS — Z79811 Long term (current) use of aromatase inhibitors: Secondary | ICD-10-CM | POA: Diagnosis not present

## 2023-09-03 DIAGNOSIS — Z170 Estrogen receptor positive status [ER+]: Secondary | ICD-10-CM | POA: Diagnosis not present

## 2023-09-03 DIAGNOSIS — Z510 Encounter for antineoplastic radiation therapy: Secondary | ICD-10-CM | POA: Diagnosis not present

## 2023-09-03 DIAGNOSIS — C50412 Malignant neoplasm of upper-outer quadrant of left female breast: Secondary | ICD-10-CM | POA: Diagnosis not present

## 2023-09-03 LAB — RAD ONC ARIA SESSION SUMMARY
Course Elapsed Days: 24
Plan Fractions Treated to Date: 3
Plan Prescribed Dose Per Fraction: 1.8 Gy
Plan Total Fractions Prescribed: 12
Plan Total Prescribed Dose: 21.6 Gy
Reference Point Dosage Given to Date: 34.2 Gy
Reference Point Session Dosage Given: 1.8 Gy
Session Number: 19

## 2023-09-04 ENCOUNTER — Other Ambulatory Visit: Payer: Self-pay | Admitting: *Deleted

## 2023-09-04 ENCOUNTER — Ambulatory Visit
Admission: RE | Admit: 2023-09-04 | Discharge: 2023-09-04 | Disposition: A | Discharge status: Home / Self Care | Payer: BC Managed Care – PPO | Source: Ambulatory Visit | Attending: Radiation Oncology | Admitting: Radiation Oncology

## 2023-09-04 ENCOUNTER — Other Ambulatory Visit: Payer: Self-pay

## 2023-09-04 DIAGNOSIS — C50412 Malignant neoplasm of upper-outer quadrant of left female breast: Secondary | ICD-10-CM | POA: Diagnosis not present

## 2023-09-04 DIAGNOSIS — Z510 Encounter for antineoplastic radiation therapy: Secondary | ICD-10-CM | POA: Diagnosis not present

## 2023-09-04 DIAGNOSIS — Z170 Estrogen receptor positive status [ER+]: Secondary | ICD-10-CM | POA: Diagnosis not present

## 2023-09-04 DIAGNOSIS — Z79811 Long term (current) use of aromatase inhibitors: Secondary | ICD-10-CM | POA: Diagnosis not present

## 2023-09-04 LAB — RAD ONC ARIA SESSION SUMMARY
Course Elapsed Days: 25
Plan Fractions Treated to Date: 4
Plan Prescribed Dose Per Fraction: 1.8 Gy
Plan Total Fractions Prescribed: 12
Plan Total Prescribed Dose: 21.6 Gy
Reference Point Dosage Given to Date: 36 Gy
Reference Point Session Dosage Given: 1.8 Gy
Session Number: 20

## 2023-09-07 ENCOUNTER — Inpatient Hospital Stay: Payer: BC Managed Care – PPO

## 2023-09-07 ENCOUNTER — Encounter: Payer: Self-pay | Admitting: Oncology

## 2023-09-07 ENCOUNTER — Inpatient Hospital Stay (HOSPITAL_BASED_OUTPATIENT_CLINIC_OR_DEPARTMENT_OTHER): Payer: BC Managed Care – PPO | Admitting: Oncology

## 2023-09-07 ENCOUNTER — Ambulatory Visit
Admission: RE | Admit: 2023-09-07 | Discharge: 2023-09-07 | Disposition: A | Discharge status: Home / Self Care | Payer: BC Managed Care – PPO | Source: Ambulatory Visit | Attending: Radiation Oncology | Admitting: Radiation Oncology

## 2023-09-07 ENCOUNTER — Inpatient Hospital Stay: Payer: BC Managed Care – PPO | Admitting: Pharmacist

## 2023-09-07 ENCOUNTER — Other Ambulatory Visit: Payer: Self-pay

## 2023-09-07 VITALS — BP 141/98 | HR 66 | Temp 97.5°F | Resp 18 | Ht 67.0 in | Wt 221.4 lb

## 2023-09-07 DIAGNOSIS — C50412 Malignant neoplasm of upper-outer quadrant of left female breast: Secondary | ICD-10-CM

## 2023-09-07 DIAGNOSIS — Z510 Encounter for antineoplastic radiation therapy: Secondary | ICD-10-CM | POA: Diagnosis not present

## 2023-09-07 DIAGNOSIS — Z170 Estrogen receptor positive status [ER+]: Secondary | ICD-10-CM

## 2023-09-07 DIAGNOSIS — Z79899 Other long term (current) drug therapy: Secondary | ICD-10-CM

## 2023-09-07 DIAGNOSIS — Z79811 Long term (current) use of aromatase inhibitors: Secondary | ICD-10-CM

## 2023-09-07 LAB — CBC WITH DIFFERENTIAL/PLATELET
Abs Immature Granulocytes: 0.01 10*3/uL (ref 0.00–0.07)
Basophils Absolute: 0 10*3/uL (ref 0.0–0.1)
Basophils Relative: 1 %
Eosinophils Absolute: 0.1 10*3/uL (ref 0.0–0.5)
Eosinophils Relative: 3 %
HCT: 41.1 % (ref 36.0–46.0)
Hemoglobin: 13.3 g/dL (ref 12.0–15.0)
Immature Granulocytes: 0 %
Lymphocytes Relative: 21 %
Lymphs Abs: 1 10*3/uL (ref 0.7–4.0)
MCH: 27.9 pg (ref 26.0–34.0)
MCHC: 32.4 g/dL (ref 30.0–36.0)
MCV: 86.3 fL (ref 80.0–100.0)
Monocytes Absolute: 0.5 10*3/uL (ref 0.1–1.0)
Monocytes Relative: 10 %
Neutro Abs: 3.1 10*3/uL (ref 1.7–7.7)
Neutrophils Relative %: 65 %
Platelets: 249 10*3/uL (ref 150–400)
RBC: 4.76 MIL/uL (ref 3.87–5.11)
RDW: 13.9 % (ref 11.5–15.5)
WBC: 4.7 10*3/uL (ref 4.0–10.5)
nRBC: 0 % (ref 0.0–0.2)

## 2023-09-07 LAB — RAD ONC ARIA SESSION SUMMARY
Course Elapsed Days: 28
Plan Fractions Treated to Date: 5
Plan Prescribed Dose Per Fraction: 1.8 Gy
Plan Total Fractions Prescribed: 12
Plan Total Prescribed Dose: 21.6 Gy
Reference Point Dosage Given to Date: 37.8 Gy
Reference Point Session Dosage Given: 1.8 Gy
Session Number: 21

## 2023-09-07 LAB — CMP (CANCER CENTER ONLY)
ALT: 33 U/L (ref 0–44)
AST: 25 U/L (ref 15–41)
Albumin: 4 g/dL (ref 3.5–5.0)
Alkaline Phosphatase: 79 U/L (ref 38–126)
Anion gap: 11 (ref 5–15)
BUN: 24 mg/dL — ABNORMAL HIGH (ref 6–20)
CO2: 29 mmol/L (ref 22–32)
Calcium: 9.5 mg/dL (ref 8.9–10.3)
Chloride: 97 mmol/L — ABNORMAL LOW (ref 98–111)
Creatinine: 0.85 mg/dL (ref 0.44–1.00)
GFR, Estimated: >60 mL/min (ref >60)
Glucose, Bld: 106 mg/dL — ABNORMAL HIGH (ref 70–99)
Potassium: 3.6 mmol/L (ref 3.5–5.1)
Sodium: 137 mmol/L (ref 135–145)
Total Bilirubin: 0.3 mg/dL (ref 0.0–1.2)
Total Protein: 7.7 g/dL (ref 6.5–8.1)

## 2023-09-07 NOTE — Progress Notes (Signed)
 Clinical Pharmacist Practitioner Clinic [facility]  Telephone:([phone] [phone]  Patient Care Team: Sallyanne Kuster, NP as PCP - General (Nurse Practitioner) Hulen Luster, RN as Oncology Nurse Navigator Creig Hines, MD as Consulting Physician (Oncology) Carmina Miller, MD as Consulting Physician (Radiation Oncology)   Name of the patient: Audrey Ball  [id number]  01/17/1971   Date of visit: 09/07/23  HPI: Patient is a 53 y.o. female with ER/PR+ HER2- early stage breast cancer. Patient is considering adjuvant treatment with abemaciclib after completion of her radiation.   Reason for Consult: Abemaciclib oral chemotherapy education.   PAST MEDICAL HISTORY: Past Medical History:  Diagnosis Date   Breast cancer [facility])    Hypertension     HEMATOLOGY/ONCOLOGY HISTORY:  Oncology History  Malignant neoplasm of upper-outer quadrant of left breast in female, estrogen receptor positive (HCC)  06/07/2023 Initial Diagnosis   Malignant neoplasm of upper-outer quadrant of left breast in female, estrogen receptor positive (HCC)   06/07/2023 Cancer Staging   Staging form: Breast, AJCC 8th Edition - Clinical stage from 06/07/2023: Stage IB (cT1c, cN1, cM0, G2, ER+, PR+, HER2-) - Signed by Creig Hines, MD on 06/07/2023 Histologic grading system: 3 grade system    Genetic Testing   No pathogenic variants identified on the [facility] CancerNext+RNA panel. VUS in APC called [id number] identified. The report date is 06/22/2023.  The [facility] CancerNext+RNAinsight Panel includes sequencing, rearrangement analysis, and RNA analysis for the following 39 genes: APC, ATM, BAP1, BARD1, BMPR1A, BRCA1, BRCA2, BRIP1, CDH1, CDKN2A, CHEK2, FH, FLCN, MET, MLH1, MSH2, MSH6, MUTYH, NF1, NTHL1, PALB2, PMS2, PTEN, RAD51C, RAD51D, SMAD4, STK11, TP53, TSC1, TSC2, and VHL (sequencing and deletion/duplication); AXIN2, HOXB13, MBD4, MSH3, POLD1 and POLE (sequencing only); EPCAM and  GREM1 (deletion/duplication only).   07/27/2023 Cancer Staging   Staging form: Breast, AJCC 8th Edition - Pathologic stage from 07/27/2023: Stage IB (pT2, pN1a(sn), cM0, G2, ER+, PR+, HER2-, Oncotype DX score: 12) - Signed by Creig Hines, MD on 07/30/2023 Stage prefix: Initial diagnosis Method of lymph node assessment: Sentinel lymph node biopsy Multigene prognostic tests performed: Oncotype DX Recurrence score range: Greater than or equal to 11 Histologic grading system: 3 grade system     ALLERGIES:  is allergic to bactrim [sulfamethoxazole-trimethoprim], sulfa antibiotics, penicillins, and propoxyphene.  MEDICATIONS:  Current Outpatient Medications  Medication Sig Dispense Refill   acetaminophen (TYLENOL) 650 MG CR tablet Take 650 mg by mouth at bedtime.     amphetamine-dextroamphetamine (ADDERALL) 20 MG tablet TAKE 1 TABLET BY MOUTH TWICE A DAY AS NEEDED FOR FOCUS (Patient not taking: Reported on 06/11/2023) 60 tablet 0   amphetamine-dextroamphetamine (ADDERALL) 20 MG tablet TAKE 1 TABLET BY MOUTH TWICE A DAY AS NEEDED FOR FOCUS (Patient not taking: Reported on 06/11/2023) 60 tablet 0   amphetamine-dextroamphetamine (ADDERALL) 20 MG tablet TAKE 1 TABLET BY MOUTH TWICE A DAY AS NEEDED FOR FOCUS 60 tablet 0   betamethasone valerate ointment (VALISONE) 0.1 % Apply 1 Application topically daily as needed (rash). To affected area until resolved. 45 g 5   Cholecalciferol (VITAMIN D) 50 MCG (2000 UT) tablet Take 2,000 Units by mouth daily.     hydrochlorothiazide (HYDRODIURIL) 25 MG tablet TAKE 1 TABLET (25 MG TOTAL) BY MOUTH DAILY. 90 tablet 1   letrozole (FEMARA) 2.5 MG tablet Take 1 tablet (2.5 mg total) by mouth daily. 30 tablet 3   meloxicam (MOBIC) 15 MG tablet Take 1 tablet (15 mg total) by mouth daily. (Patient taking differently: Take  15 mg by mouth at bedtime.) 90 tablet 1   oxyCODONE-acetaminophen (PERCOCET) 5-325 MG tablet Take 1 tablet by mouth every 4 (four) hours as needed for  severe pain (pain score 7-10). 20 tablet 0   traMADol (ULTRAM) 50 MG tablet Take 1 tablet (50 mg total) by mouth every 6 (six) hours as needed for moderate pain or severe pain. 20 tablet 0   No current facility-administered medications for this visit.    VITAL SIGNS: There were no vitals taken for this visit. There were no vitals filed for this visit.  Estimated body mass index is 34.68 kg/m as calculated from the following:   Height as of an earlier encounter on 09/07/23: 5\' 7"  (1.702 m).   Weight as of an earlier encounter on 09/07/23: 100.4 kg (221 lb 6.4 oz).  LABS: CBC:    Component Value Date/Time   WBC 4.7 09/07/2023 1413   HGB 13.3 09/07/2023 1413   HGB 13.2 08/31/2023 1442   HGB 14.2 07/07/2022 0939   HCT 41.1 09/07/2023 1413   HCT 42.6 07/07/2022 0939   PLT 249 09/07/2023 1413   PLT 249 08/31/2023 1442   PLT 271 07/07/2022 0939   MCV 86.3 09/07/2023 1413   MCV 84 07/07/2022 0939   MCV 86 11/01/2012 1341   NEUTROABS 3.1 09/07/2023 1413   NEUTROABS 2.8 07/07/2022 0939   NEUTROABS 2.4 11/01/2012 1341   LYMPHSABS 1.0 09/07/2023 1413   LYMPHSABS 2.0 07/07/2022 0939   LYMPHSABS 2.2 11/01/2012 1341   MONOABS 0.5 09/07/2023 1413   MONOABS 0.5 11/01/2012 1341   EOSABS 0.1 09/07/2023 1413   EOSABS 0.2 07/07/2022 0939   EOSABS 0.2 11/01/2012 1341   BASOSABS 0.0 09/07/2023 1413   BASOSABS 0.0 07/07/2022 0939   BASOSABS 0.1 11/01/2012 1341   Comprehensive Metabolic Panel:    Component Value Date/Time   NA 137 09/07/2023 1413   NA 143 07/07/2022 0939   NA 142 11/01/2012 1341   K 3.6 09/07/2023 1413   K 3.5 11/01/2012 1341   CL 97 (L) 09/07/2023 1413   CL 108 (H) 11/01/2012 1341   CO2 29 09/07/2023 1413   CO2 29 11/01/2012 1341   BUN 24 (H) 09/07/2023 1413   BUN 13 07/07/2022 0939   BUN 11 11/01/2012 1341   CREATININE 0.85 09/07/2023 1413   CREATININE 0.79 12/15/2012 0710   GLUCOSE 106 (H) 09/07/2023 1413   GLUCOSE 90 11/01/2012 1341   CALCIUM 9.5 09/07/2023  1413   CALCIUM 8.6 11/01/2012 1341   AST 25 09/07/2023 1413   ALT 33 09/07/2023 1413   ALT 18 11/01/2012 1341   ALKPHOS 79 09/07/2023 1413   ALKPHOS 47 (L) 11/01/2012 1341   BILITOT 0.3 09/07/2023 1413   PROT 7.7 09/07/2023 1413   PROT 7.4 07/07/2022 0939   PROT 6.7 11/01/2012 1341   ALBUMIN 4.0 09/07/2023 1413   ALBUMIN 4.6 07/07/2022 0939   ALBUMIN 3.5 11/01/2012 1341     Present during today's visit: patient only  Start plan: Patient is considering whether she wants to move forward with abemaciclib. If she does, she will start on 4/14, one week after XRT is complete   Patient Education I spoke with patient for overview of new oral chemotherapy medication: abemaciclib   Administration: Counseled patient on administration, dosing, side effects, monitoring, drug-food interactions, safe handling, storage, and disposal. Patient will take 1 tablet (150mg ) by mouth twice daily.  Side Effects: Side effects include but not limited to: diarrhea, nausea, fatigue, decreased wbc/hgb/plt.  Nausea: reviewed the use of ondansetron and prochlorperazine for management of nausea Diarrhea: patient knows to use loperamide as needed and call the office if they are having four or more loose stool per day  Drug-drug Interactions (DDI): No current DDIs with abemaciclib  Adherence: After discussion with patient no patient barriers to medication adherence identified.  Reviewed with patient importance of keeping a medication schedule and plan for any missed doses.  Ms. Pelto voiced understanding and appreciation. All questions answered. Medication handout provided.  She is unsure if she wants to proceed with the abemaciclib. 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 Reviewed that we could take a more preventative approach to management of diarrhea and nausea. Will check back in with Ms. Orellana in ~2 weeks to see if she has  made a decision   Provided patient with Oral Chemotherapy Navigation Clinic phone number. Patient knows to call the office with questions or concerns. Oral Chemotherapy Navigation Clinic will continue to follow.  Patient expressed understanding and was in agreement with this plan. She also understands that She can call clinic at any time with any questions, concerns, or complaints.   Medication Access Issues: If she wants to proceed with abemaciclib she would fill at [facility], not sending in Rx yet  Follow-up plan: RTC as scheduled  Thank you for allowing me to participate in the care of this patient.   Time Total: 15 mins  Visit consisted of counseling and education on dealing with issues of symptom management in the setting of serious and potentially life-threatening illness.Greater than 50%  of this time was spent counseling and coordinating care related to the above assessment and plan.  Signed by: Remi Haggard, PharmD, Nolon Bussing, CPP Hematology/Oncology Clinical Pharmacist Practitioner [place] [phone]  09/07/2023 4:31 PM

## 2023-09-08 ENCOUNTER — Ambulatory Visit
Admission: RE | Admit: 2023-09-08 | Discharge: 2023-09-08 | Disposition: A | Discharge status: Home / Self Care | Payer: BC Managed Care – PPO | Source: Ambulatory Visit | Attending: Radiation Oncology | Admitting: Radiation Oncology

## 2023-09-08 ENCOUNTER — Other Ambulatory Visit: Payer: Self-pay

## 2023-09-08 DIAGNOSIS — Z510 Encounter for antineoplastic radiation therapy: Secondary | ICD-10-CM | POA: Diagnosis not present

## 2023-09-08 DIAGNOSIS — C50412 Malignant neoplasm of upper-outer quadrant of left female breast: Secondary | ICD-10-CM | POA: Diagnosis not present

## 2023-09-08 DIAGNOSIS — Z170 Estrogen receptor positive status [ER+]: Secondary | ICD-10-CM | POA: Diagnosis not present

## 2023-09-08 DIAGNOSIS — Z79811 Long term (current) use of aromatase inhibitors: Secondary | ICD-10-CM | POA: Diagnosis not present

## 2023-09-08 LAB — RAD ONC ARIA SESSION SUMMARY
Course Elapsed Days: 29
Plan Fractions Treated to Date: 6
Plan Prescribed Dose Per Fraction: 1.8 Gy
Plan Total Fractions Prescribed: 12
Plan Total Prescribed Dose: 21.6 Gy
Reference Point Dosage Given to Date: 39.6 Gy
Reference Point Session Dosage Given: 1.8 Gy
Session Number: 22

## 2023-09-09 ENCOUNTER — Other Ambulatory Visit: Payer: Self-pay

## 2023-09-09 ENCOUNTER — Other Ambulatory Visit

## 2023-09-09 ENCOUNTER — Ambulatory Visit
Admission: RE | Admit: 2023-09-09 | Discharge: 2023-09-09 | Disposition: A | Discharge status: Home / Self Care | Payer: BC Managed Care – PPO | Source: Ambulatory Visit | Attending: Radiation Oncology | Admitting: Radiation Oncology

## 2023-09-09 DIAGNOSIS — Z79811 Long term (current) use of aromatase inhibitors: Secondary | ICD-10-CM | POA: Diagnosis not present

## 2023-09-09 DIAGNOSIS — C50412 Malignant neoplasm of upper-outer quadrant of left female breast: Secondary | ICD-10-CM | POA: Diagnosis not present

## 2023-09-09 DIAGNOSIS — Z170 Estrogen receptor positive status [ER+]: Secondary | ICD-10-CM | POA: Diagnosis not present

## 2023-09-09 DIAGNOSIS — Z510 Encounter for antineoplastic radiation therapy: Secondary | ICD-10-CM | POA: Diagnosis not present

## 2023-09-09 LAB — RAD ONC ARIA SESSION SUMMARY
Course Elapsed Days: 30
Plan Fractions Treated to Date: 7
Plan Prescribed Dose Per Fraction: 1.8 Gy
Plan Total Fractions Prescribed: 12
Plan Total Prescribed Dose: 21.6 Gy
Reference Point Dosage Given to Date: 41.4 Gy
Reference Point Session Dosage Given: 1.8 Gy
Session Number: 23

## 2023-09-10 ENCOUNTER — Other Ambulatory Visit: Payer: Self-pay

## 2023-09-10 ENCOUNTER — Ambulatory Visit
Admission: RE | Admit: 2023-09-10 | Discharge: 2023-09-10 | Disposition: A | Discharge status: Home / Self Care | Payer: BC Managed Care – PPO | Source: Ambulatory Visit | Attending: Radiation Oncology | Admitting: Radiation Oncology

## 2023-09-10 DIAGNOSIS — Z170 Estrogen receptor positive status [ER+]: Secondary | ICD-10-CM | POA: Diagnosis not present

## 2023-09-10 DIAGNOSIS — Z510 Encounter for antineoplastic radiation therapy: Secondary | ICD-10-CM | POA: Diagnosis not present

## 2023-09-10 DIAGNOSIS — C50412 Malignant neoplasm of upper-outer quadrant of left female breast: Secondary | ICD-10-CM | POA: Diagnosis not present

## 2023-09-10 DIAGNOSIS — Z79811 Long term (current) use of aromatase inhibitors: Secondary | ICD-10-CM | POA: Diagnosis not present

## 2023-09-10 LAB — RAD ONC ARIA SESSION SUMMARY
Course Elapsed Days: 31
Plan Fractions Treated to Date: 8
Plan Prescribed Dose Per Fraction: 1.8 Gy
Plan Total Fractions Prescribed: 12
Plan Total Prescribed Dose: 21.6 Gy
Reference Point Dosage Given to Date: 43.2 Gy
Reference Point Session Dosage Given: 1.8 Gy
Session Number: 24

## 2023-09-10 NOTE — Progress Notes (Signed)
 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  This was followed by diagnostic bilateral mammogram which showed a 12 mm mass at the site of palpable concern concerning for malignancy along with nonenlarged left axillary lymph node.  Ultrasound also confirmed the same findings.  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  Tumor was ER 100% positive, PR 70% positive and HER2 negative +1.   Final pathology showed tumor size of 3.6 cm with negative margins grade 2 invasive ductal carcinoma with lobular features.  3 out of 5 sentinel lymph nodes positive for malignancy.  2 of them had macrometastases and 1 with micrometastases.  Extranodal extension present.  Oncotype testing was sent which came back at intermediate risk with a recurrence score of 12.  She did not require any adjuvant chemotherapy and was started on letrozole.  She is presently undergoing adjuvant radiation therapy.  Interval history-patient is tolerating letrozole well without any significant side effects.  Tolerating radiation well so far  ECOG PS- 1 Pain scale- 0   Review of systems- Review of  Systems  Constitutional:  Negative for chills, fever, malaise/fatigue and weight loss.  HENT:  Negative for congestion, ear discharge and nosebleeds.   Eyes:  Negative for blurred vision.  Respiratory:  Negative for cough, hemoptysis, sputum production, shortness of breath and wheezing.   Cardiovascular:  Negative for chest pain, palpitations, orthopnea and claudication.  Gastrointestinal:  Negative for abdominal pain, blood in stool, constipation, diarrhea, heartburn, melena, nausea and vomiting.  Genitourinary:  Negative for dysuria, flank pain, frequency, hematuria and urgency.  Musculoskeletal:  Negative for back pain, joint pain and myalgias.  Skin:  Negative for rash.  Neurological:  Negative for dizziness, tingling, focal weakness, seizures, weakness and headaches.  Endo/Heme/Allergies:  Does not bruise/bleed easily.  Psychiatric/Behavioral:  Negative for depression and suicidal ideas. The patient does not have insomnia.       Allergies  Allergen Reactions   Bactrim [Sulfamethoxazole-Trimethoprim] Hives and Swelling    Swelling in lips and leg   Sulfa Antibiotics Hives   Penicillins Rash   Propoxyphene Other (See Comments)    headache      Past Medical History:  Diagnosis Date   Breast cancer (HCC)    Hypertension      Past Surgical History:  Procedure Laterality Date   BACK SURGERY  2017   BREAST BIOPSY Left 05/27/2023   u/s bx,mass 10:00, RIBBON clip-path pending   BREAST BIOPSY Left 05/27/2023   u/s bx, axilla/BUTTERFLY clippath pending   BREAST BIOPSY Left 05/27/2023   [place] LT BREAST BX W LOC DEV 1ST LESION IMG BX SPEC US GUIDE 05/27/2023 [facility]   BREAST BIOPSY Left 06/15/2023   [place] LT RADIO FREQUENCY TAG LOC US GUIDE  06/15/2023 [facility]   BREAST BIOPSY Left 06/15/2023   [place] LT RADIO FREQUENCY TAG EA ADD LESION LOC US GUIDE 06/15/2023 [facility]   COLONOSCOPY WITH PROPOFOL N/A 08/16/2021   Procedure: COLONOSCOPY WITH PROPOFOL;  Surgeon:  Toney Reil, MD;  Location: [facility] ENDOSCOPY;  Service: Gastroenterology;  Laterality: N/A;   GALLBLADDER SURGERY  1999   PART MASTECTOMY,RADIO FREQUENCY LOCALIZER,AXILLARY SENTINEL NODE BIOPSY Left 06/22/2023   Procedure: PART MASTECTOMY,RADIO FREQUENCY LOCALIZER,AXILLARY SENTINEL NODE BIOPSY;  Surgeon: Carolan Shiver, MD;  Location: [facility] ORS;  Service: General;  Laterality: Left;   TOTAL VAGINAL HYSTERECTOMY  2016   bilateral salpingectomy at [facility] for abnormal uterine bleeding    Social History   Socioeconomic History   Marital status: Single    Spouse name: Not on file   Number of children: Not on file   Years of education: Not on file   Highest education level: Not on file  Occupational History   Not on file  Tobacco Use   Smoking status: Never   Smokeless tobacco: Never  Vaping Use   Vaping status: Never Used  Substance and Sexual Activity   Alcohol use: No   Drug use: No   Sexual activity: Yes    Birth control/protection: Surgical  Other Topics Concern   Not on file  Social History Narrative   Not on file   Social Drivers of Health   Financial Resource Strain: Low Risk  (06/04/2023)   Received from [facility]   Overall Financial Resource Strain (CARDIA)    Difficulty of Paying Living Expenses: Not hard at all  Food Insecurity: No Food Insecurity (06/04/2023)   Received from [facility]   Hunger Vital Sign    Worried About Running Out of Food in the Last Year: Never true    Ran Out of Food in the Last Year: Never true  Transportation Needs: No Transportation Needs (06/04/2023)   Received from [facility]    In the past 12 months, has lack of transportation kept you from medical appointments or from getting medications?: No    Lack of Transportation (Non-Medical): No  Physical Activity: Not on file  Stress: Not on file  Social Connections: Not on file  Intimate Partner  Violence: Not At Risk (06/03/2023)   Humiliation, Afraid, Rape, and Kick questionnaire    Fear of Current or Ex-Partner: No    Emotionally Abused: No    Physically Abused: No    Sexually Abused: No    Family History  Problem Relation Age of Onset   Leukemia Mother 59   Hypertension Mother    Diabetes Mother    Lung cancer Father 64   Throat cancer Brother        Currently in remission.   Lung cancer Maternal Grandmother        d.>50   Breast cancer Paternal Grandmother        dx 73s     Current Outpatient Medications:    acetaminophen (TYLENOL) 650 MG CR tablet, Take 650 mg by mouth at bedtime., Disp: , Rfl:    amphetamine-dextroamphetamine (ADDERALL) 20 MG tablet, TAKE 1 TABLET BY MOUTH TWICE A DAY AS NEEDED FOR FOCUS (Patient not taking: Reported on 06/11/2023), Disp: 60 tablet, Rfl: 0   amphetamine-dextroamphetamine (ADDERALL) 20 MG tablet, TAKE 1 TABLET BY MOUTH TWICE A DAY AS NEEDED FOR FOCUS (Patient not taking: Reported on 06/11/2023), Disp: 60 tablet, Rfl: 0   amphetamine-dextroamphetamine (ADDERALL)  20 MG tablet, TAKE 1 TABLET BY MOUTH TWICE A DAY AS NEEDED FOR FOCUS, Disp: 60 tablet, Rfl: 0   betamethasone valerate ointment (VALISONE) 0.1 %, Apply 1 Application topically daily as needed (rash). To affected area until resolved., Disp: 45 g, Rfl: 5   Cholecalciferol (VITAMIN D) 50 MCG (2000 UT) tablet, Take 2,000 Units by mouth daily., Disp: , Rfl:    hydrochlorothiazide (HYDRODIURIL) 25 MG tablet, TAKE 1 TABLET (25 MG TOTAL) BY MOUTH DAILY., Disp: 90 tablet, Rfl: 1   letrozole (FEMARA) 2.5 MG tablet, Take 1 tablet (2.5 mg total) by mouth daily., Disp: 30 tablet, Rfl: 3   meloxicam (MOBIC) 15 MG tablet, Take 1 tablet (15 mg total) by mouth daily. (Patient taking differently: Take 15 mg by mouth at bedtime.), Disp: 90 tablet, Rfl: 1   oxyCODONE-acetaminophen (PERCOCET) 5-325 MG tablet, Take 1 tablet by mouth every 4 (four) hours as needed for severe pain (pain score 7-10).,  Disp: 20 tablet, Rfl: 0   traMADol (ULTRAM) 50 MG tablet, Take 1 tablet (50 mg total) by mouth every 6 (six) hours as needed for moderate pain or severe pain., Disp: 20 tablet, Rfl: 0  Physical exam:  Vitals:   09/07/23 1433  BP: (!) 141/98  Pulse: 66  Resp: 18  Temp: (!) 97.5 F (36.4 C)  TempSrc: Tympanic  SpO2: 100%  Weight: 221 lb 6.4 oz (100.4 kg)  Height: 5\' 7"  (1.702 m)   Physical Exam Cardiovascular:     Rate and Rhythm: Normal rate and regular rhythm.     Heart sounds: Normal heart sounds.  Pulmonary:     Effort: Pulmonary effort is normal.     Breath sounds: Normal breath sounds.  Skin:    General: Skin is warm and dry.  Neurological:     Mental Status: She is alert and oriented to person, place, and time.         Latest Ref Rng & Units 09/07/2023    2:13 PM  CMP  Glucose 70 - 99 mg/dL 409   BUN 6 - 20 mg/dL 24   Creatinine 8.11 - 1.00 mg/dL 9.14   Sodium 782 - 956 mmol/L 137   Potassium 3.5 - 5.1 mmol/L 3.6   Chloride 98 - 111 mmol/L 97   CO2 22 - 32 mmol/L 29   Calcium 8.9 - 10.3 mg/dL 9.5   Total Protein 6.5 - 8.1 g/dL 7.7   Total Bilirubin 0.0 - 1.2 mg/dL 0.3   Alkaline Phos 38 - 126 U/L 79   AST 15 - 41 U/L 25   ALT 0 - 44 U/L 33       Latest Ref Rng & Units 09/07/2023    2:13 PM  CBC  WBC 4.0 - 10.5 K/uL 4.7   Hemoglobin 12.0 - 15.0 g/dL 21.3   Hematocrit 08.6 - 46.0 % 41.1   Platelets 150 - 400 K/uL 249      Assessment and plan- Patient is a 53 y.o. female  with pathological prognostic stage Ia invasive mammary carcinoma of the left breast pT2 N1a M0 ER/PR positive HER2 negative.  She is currently undergoing radiation therapy and is on letrozole and is here for routine follow-up  Patient will continue letrozole for at least 5 if not 10 years.  Will plan to get a bone density scan ASAP.  She has node positive disease and although she did not require any adjuvant chemotherapy she will qualify for adjuvant CDK inhibitors either Verzenio or  Kisqali.  Patient met with overall pharmacist Merrily Pew today we discussed pros and cons of Verzenio with her.  Patient does not keen on starting Verzenio at this time.   I will see her back in 2 months with labs Visit Diagnosis 1. Malignant neoplasm of upper-outer quadrant of left breast in female, estrogen receptor positive (HCC)   2. High risk medication use   3. Use of letrozole (Femara)      Dr. Owens Shark, MD, MPH [facility] [phone] 09/10/2023 11:05 AM

## 2023-09-11 ENCOUNTER — Ambulatory Visit
Admission: RE | Admit: 2023-09-11 | Discharge: 2023-09-11 | Disposition: A | Discharge status: Home / Self Care | Payer: BC Managed Care – PPO | Source: Ambulatory Visit | Attending: Radiation Oncology | Admitting: Radiation Oncology

## 2023-09-11 ENCOUNTER — Other Ambulatory Visit: Payer: Self-pay

## 2023-09-11 DIAGNOSIS — C50412 Malignant neoplasm of upper-outer quadrant of left female breast: Secondary | ICD-10-CM | POA: Diagnosis not present

## 2023-09-11 DIAGNOSIS — Z79811 Long term (current) use of aromatase inhibitors: Secondary | ICD-10-CM | POA: Diagnosis not present

## 2023-09-11 DIAGNOSIS — Z170 Estrogen receptor positive status [ER+]: Secondary | ICD-10-CM | POA: Diagnosis not present

## 2023-09-11 DIAGNOSIS — Z510 Encounter for antineoplastic radiation therapy: Secondary | ICD-10-CM | POA: Diagnosis not present

## 2023-09-11 LAB — RAD ONC ARIA SESSION SUMMARY
Course Elapsed Days: 32
Plan Fractions Treated to Date: 9
Plan Prescribed Dose Per Fraction: 1.8 Gy
Plan Total Fractions Prescribed: 12
Plan Total Prescribed Dose: 21.6 Gy
Reference Point Dosage Given to Date: 45 Gy
Reference Point Session Dosage Given: 1.8 Gy
Session Number: 25

## 2023-09-14 ENCOUNTER — Other Ambulatory Visit: Payer: Self-pay

## 2023-09-14 ENCOUNTER — Inpatient Hospital Stay: Payer: BC Managed Care – PPO

## 2023-09-14 ENCOUNTER — Ambulatory Visit
Admission: RE | Admit: 2023-09-14 | Discharge: 2023-09-14 | Disposition: A | Discharge status: Home / Self Care | Payer: BC Managed Care – PPO | Source: Ambulatory Visit | Attending: Radiation Oncology | Admitting: Radiation Oncology

## 2023-09-14 DIAGNOSIS — Z510 Encounter for antineoplastic radiation therapy: Secondary | ICD-10-CM | POA: Diagnosis not present

## 2023-09-14 DIAGNOSIS — Z170 Estrogen receptor positive status [ER+]: Secondary | ICD-10-CM | POA: Diagnosis not present

## 2023-09-14 DIAGNOSIS — C50412 Malignant neoplasm of upper-outer quadrant of left female breast: Secondary | ICD-10-CM

## 2023-09-14 DIAGNOSIS — Z79811 Long term (current) use of aromatase inhibitors: Secondary | ICD-10-CM | POA: Diagnosis not present

## 2023-09-14 LAB — CBC (CANCER CENTER ONLY)
HCT: 41.3 % (ref 36.0–46.0)
Hemoglobin: 13.5 g/dL (ref 12.0–15.0)
MCH: 28 pg (ref 26.0–34.0)
MCHC: 32.7 g/dL (ref 30.0–36.0)
MCV: 85.7 fL (ref 80.0–100.0)
Platelet Count: 263 10*3/uL (ref 150–400)
RBC: 4.82 MIL/uL (ref 3.87–5.11)
RDW: 13.7 % (ref 11.5–15.5)
WBC Count: 5.2 10*3/uL (ref 4.0–10.5)
nRBC: 0 % (ref 0.0–0.2)

## 2023-09-14 LAB — RAD ONC ARIA SESSION SUMMARY
Course Elapsed Days: 35
Plan Fractions Treated to Date: 10
Plan Prescribed Dose Per Fraction: 1.8 Gy
Plan Total Fractions Prescribed: 12
Plan Total Prescribed Dose: 21.6 Gy
Reference Point Dosage Given to Date: 46.8 Gy
Reference Point Session Dosage Given: 1.8 Gy
Session Number: 26

## 2023-09-15 ENCOUNTER — Ambulatory Visit
Admission: RE | Admit: 2023-09-15 | Discharge: 2023-09-15 | Disposition: A | Discharge status: Home / Self Care | Payer: BC Managed Care – PPO | Source: Ambulatory Visit | Attending: Radiation Oncology | Admitting: Radiation Oncology

## 2023-09-15 ENCOUNTER — Other Ambulatory Visit: Payer: Self-pay | Admitting: *Deleted

## 2023-09-15 ENCOUNTER — Other Ambulatory Visit: Payer: Self-pay

## 2023-09-15 DIAGNOSIS — Z510 Encounter for antineoplastic radiation therapy: Secondary | ICD-10-CM | POA: Diagnosis not present

## 2023-09-15 DIAGNOSIS — C50412 Malignant neoplasm of upper-outer quadrant of left female breast: Secondary | ICD-10-CM | POA: Diagnosis not present

## 2023-09-15 DIAGNOSIS — Z170 Estrogen receptor positive status [ER+]: Secondary | ICD-10-CM | POA: Diagnosis not present

## 2023-09-15 DIAGNOSIS — Z79811 Long term (current) use of aromatase inhibitors: Secondary | ICD-10-CM | POA: Diagnosis not present

## 2023-09-15 LAB — RAD ONC ARIA SESSION SUMMARY
Course Elapsed Days: 36
Plan Fractions Treated to Date: 11
Plan Prescribed Dose Per Fraction: 1.8 Gy
Plan Total Fractions Prescribed: 12
Plan Total Prescribed Dose: 21.6 Gy
Reference Point Dosage Given to Date: 48.6 Gy
Reference Point Session Dosage Given: 1.8 Gy
Session Number: 27

## 2023-09-16 ENCOUNTER — Ambulatory Visit: Payer: BC Managed Care – PPO

## 2023-09-16 ENCOUNTER — Ambulatory Visit
Admission: RE | Admit: 2023-09-16 | Discharge: 2023-09-16 | Discharge status: Home / Self Care | Source: Ambulatory Visit | Attending: Radiation Oncology | Admitting: Radiation Oncology

## 2023-09-16 DIAGNOSIS — Z510 Encounter for antineoplastic radiation therapy: Secondary | ICD-10-CM | POA: Diagnosis not present

## 2023-09-16 DIAGNOSIS — C50412 Malignant neoplasm of upper-outer quadrant of left female breast: Secondary | ICD-10-CM | POA: Diagnosis not present

## 2023-09-16 DIAGNOSIS — Z79811 Long term (current) use of aromatase inhibitors: Secondary | ICD-10-CM | POA: Diagnosis not present

## 2023-09-16 DIAGNOSIS — Z170 Estrogen receptor positive status [ER+]: Secondary | ICD-10-CM | POA: Diagnosis not present

## 2023-09-17 ENCOUNTER — Ambulatory Visit
Admission: RE | Admit: 2023-09-17 | Discharge: 2023-09-17 | Disposition: A | Discharge status: Home / Self Care | Payer: BC Managed Care – PPO | Source: Ambulatory Visit | Attending: Radiation Oncology | Admitting: Radiation Oncology

## 2023-09-17 ENCOUNTER — Other Ambulatory Visit: Payer: Self-pay

## 2023-09-17 DIAGNOSIS — Z170 Estrogen receptor positive status [ER+]: Secondary | ICD-10-CM | POA: Diagnosis not present

## 2023-09-17 DIAGNOSIS — Z79811 Long term (current) use of aromatase inhibitors: Secondary | ICD-10-CM | POA: Diagnosis not present

## 2023-09-17 DIAGNOSIS — Z510 Encounter for antineoplastic radiation therapy: Secondary | ICD-10-CM | POA: Diagnosis not present

## 2023-09-17 DIAGNOSIS — C50412 Malignant neoplasm of upper-outer quadrant of left female breast: Secondary | ICD-10-CM | POA: Diagnosis not present

## 2023-09-17 LAB — RAD ONC ARIA SESSION SUMMARY
Course Elapsed Days: 38
Plan Fractions Treated to Date: 1
Plan Prescribed Dose Per Fraction: 2 Gy
Plan Total Fractions Prescribed: 8
Plan Total Prescribed Dose: 16 Gy
Reference Point Dosage Given to Date: 2 Gy
Reference Point Session Dosage Given: 2 Gy
Session Number: 28

## 2023-09-18 ENCOUNTER — Ambulatory Visit
Admission: RE | Admit: 2023-09-18 | Discharge: 2023-09-18 | Disposition: A | Discharge status: Home / Self Care | Payer: BC Managed Care – PPO | Source: Ambulatory Visit | Attending: Radiation Oncology | Admitting: Radiation Oncology

## 2023-09-18 ENCOUNTER — Other Ambulatory Visit: Payer: Self-pay

## 2023-09-18 DIAGNOSIS — Z79811 Long term (current) use of aromatase inhibitors: Secondary | ICD-10-CM | POA: Diagnosis not present

## 2023-09-18 DIAGNOSIS — C50412 Malignant neoplasm of upper-outer quadrant of left female breast: Secondary | ICD-10-CM | POA: Diagnosis not present

## 2023-09-18 DIAGNOSIS — Z510 Encounter for antineoplastic radiation therapy: Secondary | ICD-10-CM | POA: Diagnosis not present

## 2023-09-18 DIAGNOSIS — Z170 Estrogen receptor positive status [ER+]: Secondary | ICD-10-CM | POA: Diagnosis not present

## 2023-09-18 LAB — RAD ONC ARIA SESSION SUMMARY
Course Elapsed Days: 39
Plan Fractions Treated to Date: 2
Plan Prescribed Dose Per Fraction: 2 Gy
Plan Total Fractions Prescribed: 8
Plan Total Prescribed Dose: 16 Gy
Reference Point Dosage Given to Date: 4 Gy
Reference Point Session Dosage Given: 2 Gy
Session Number: 29

## 2023-09-21 ENCOUNTER — Ambulatory Visit
Admission: RE | Admit: 2023-09-21 | Discharge: 2023-09-21 | Disposition: A | Discharge status: Home / Self Care | Payer: BC Managed Care – PPO | Source: Ambulatory Visit | Attending: Radiation Oncology | Admitting: Radiation Oncology

## 2023-09-21 ENCOUNTER — Other Ambulatory Visit: Payer: Self-pay

## 2023-09-21 DIAGNOSIS — C50412 Malignant neoplasm of upper-outer quadrant of left female breast: Secondary | ICD-10-CM | POA: Diagnosis not present

## 2023-09-21 DIAGNOSIS — Z510 Encounter for antineoplastic radiation therapy: Secondary | ICD-10-CM | POA: Diagnosis not present

## 2023-09-21 DIAGNOSIS — Z170 Estrogen receptor positive status [ER+]: Secondary | ICD-10-CM | POA: Diagnosis not present

## 2023-09-21 DIAGNOSIS — Z79811 Long term (current) use of aromatase inhibitors: Secondary | ICD-10-CM | POA: Diagnosis not present

## 2023-09-21 LAB — RAD ONC ARIA SESSION SUMMARY
Course Elapsed Days: 42
Plan Fractions Treated to Date: 3
Plan Prescribed Dose Per Fraction: 2 Gy
Plan Total Fractions Prescribed: 8
Plan Total Prescribed Dose: 16 Gy
Reference Point Dosage Given to Date: 6 Gy
Reference Point Session Dosage Given: 2 Gy
Session Number: 30

## 2023-09-22 ENCOUNTER — Ambulatory Visit
Admission: RE | Admit: 2023-09-22 | Discharge: 2023-09-22 | Disposition: A | Discharge status: Home / Self Care | Payer: BC Managed Care – PPO | Source: Ambulatory Visit | Attending: Radiation Oncology | Admitting: Radiation Oncology

## 2023-09-22 ENCOUNTER — Other Ambulatory Visit: Payer: Self-pay

## 2023-09-22 DIAGNOSIS — C50412 Malignant neoplasm of upper-outer quadrant of left female breast: Secondary | ICD-10-CM | POA: Insufficient documentation

## 2023-09-22 DIAGNOSIS — Z170 Estrogen receptor positive status [ER+]: Secondary | ICD-10-CM | POA: Insufficient documentation

## 2023-09-22 DIAGNOSIS — Z510 Encounter for antineoplastic radiation therapy: Secondary | ICD-10-CM | POA: Diagnosis not present

## 2023-09-22 DIAGNOSIS — Z79811 Long term (current) use of aromatase inhibitors: Secondary | ICD-10-CM | POA: Diagnosis not present

## 2023-09-22 LAB — RAD ONC ARIA SESSION SUMMARY
Course Elapsed Days: 43
Plan Fractions Treated to Date: 4
Plan Prescribed Dose Per Fraction: 2 Gy
Plan Total Fractions Prescribed: 8
Plan Total Prescribed Dose: 16 Gy
Reference Point Dosage Given to Date: 8 Gy
Reference Point Session Dosage Given: 2 Gy
Session Number: 31

## 2023-09-23 ENCOUNTER — Ambulatory Visit
Admission: RE | Admit: 2023-09-23 | Discharge: 2023-09-23 | Disposition: A | Discharge status: Home / Self Care | Payer: BC Managed Care – PPO | Source: Ambulatory Visit | Attending: Radiation Oncology | Admitting: Radiation Oncology

## 2023-09-23 ENCOUNTER — Other Ambulatory Visit: Payer: Self-pay

## 2023-09-23 ENCOUNTER — Telehealth: Payer: Self-pay | Admitting: Pharmacist

## 2023-09-23 DIAGNOSIS — Z510 Encounter for antineoplastic radiation therapy: Secondary | ICD-10-CM | POA: Diagnosis not present

## 2023-09-23 DIAGNOSIS — Z79811 Long term (current) use of aromatase inhibitors: Secondary | ICD-10-CM | POA: Diagnosis not present

## 2023-09-23 DIAGNOSIS — Z170 Estrogen receptor positive status [ER+]: Secondary | ICD-10-CM | POA: Diagnosis not present

## 2023-09-23 DIAGNOSIS — C50412 Malignant neoplasm of upper-outer quadrant of left female breast: Secondary | ICD-10-CM | POA: Diagnosis not present

## 2023-09-23 LAB — RAD ONC ARIA SESSION SUMMARY
Course Elapsed Days: 44
Plan Fractions Treated to Date: 5
Plan Prescribed Dose Per Fraction: 2 Gy
Plan Total Fractions Prescribed: 8
Plan Total Prescribed Dose: 16 Gy
Reference Point Dosage Given to Date: 10 Gy
Reference Point Session Dosage Given: 2 Gy
Session Number: 32

## 2023-09-23 NOTE — Telephone Encounter (Signed)
 Oral Chemotherapy Pharmacist Encounter   Checked in with patient to see if she had made a decision on whether to proceed with abemaciclib adjuvant therapy. She is still undecided.   Reviewed with patient the data from the monarchE study. She said she would call me back if she makes a decision or discuss with Dr. Smith Robert further at her next office visit.    Remi Haggard, PharmD, BCOP, CPP Hematology/Oncology Clinical Pharmacist [facility] Oral Chemotherapy Navigation [facility] [phone]  09/23/2023 4:09 PM

## 2023-09-24 ENCOUNTER — Other Ambulatory Visit: Payer: Self-pay

## 2023-09-24 ENCOUNTER — Ambulatory Visit
Admission: RE | Admit: 2023-09-24 | Discharge: 2023-09-24 | Disposition: A | Discharge status: Home / Self Care | Payer: BC Managed Care – PPO | Source: Ambulatory Visit | Attending: Radiation Oncology | Admitting: Radiation Oncology

## 2023-09-24 DIAGNOSIS — C50412 Malignant neoplasm of upper-outer quadrant of left female breast: Secondary | ICD-10-CM | POA: Diagnosis not present

## 2023-09-24 DIAGNOSIS — Z510 Encounter for antineoplastic radiation therapy: Secondary | ICD-10-CM | POA: Diagnosis not present

## 2023-09-24 DIAGNOSIS — Z79811 Long term (current) use of aromatase inhibitors: Secondary | ICD-10-CM | POA: Diagnosis not present

## 2023-09-24 DIAGNOSIS — Z170 Estrogen receptor positive status [ER+]: Secondary | ICD-10-CM | POA: Diagnosis not present

## 2023-09-24 LAB — RAD ONC ARIA SESSION SUMMARY
Course Elapsed Days: 45
Plan Fractions Treated to Date: 6
Plan Prescribed Dose Per Fraction: 2 Gy
Plan Total Fractions Prescribed: 8
Plan Total Prescribed Dose: 16 Gy
Reference Point Dosage Given to Date: 12 Gy
Reference Point Session Dosage Given: 2 Gy
Session Number: 33

## 2023-09-25 ENCOUNTER — Other Ambulatory Visit: Payer: Self-pay

## 2023-09-25 ENCOUNTER — Ambulatory Visit
Admission: RE | Admit: 2023-09-25 | Discharge: 2023-09-25 | Disposition: A | Discharge status: Home / Self Care | Payer: BC Managed Care – PPO | Source: Ambulatory Visit | Attending: Radiation Oncology | Admitting: Radiation Oncology

## 2023-09-25 DIAGNOSIS — Z170 Estrogen receptor positive status [ER+]: Secondary | ICD-10-CM | POA: Diagnosis not present

## 2023-09-25 DIAGNOSIS — Z79811 Long term (current) use of aromatase inhibitors: Secondary | ICD-10-CM | POA: Diagnosis not present

## 2023-09-25 DIAGNOSIS — Z510 Encounter for antineoplastic radiation therapy: Secondary | ICD-10-CM | POA: Diagnosis not present

## 2023-09-25 DIAGNOSIS — C50412 Malignant neoplasm of upper-outer quadrant of left female breast: Secondary | ICD-10-CM | POA: Diagnosis not present

## 2023-09-25 LAB — RAD ONC ARIA SESSION SUMMARY
Course Elapsed Days: 46
Plan Fractions Treated to Date: 7
Plan Prescribed Dose Per Fraction: 2 Gy
Plan Total Fractions Prescribed: 8
Plan Total Prescribed Dose: 16 Gy
Reference Point Dosage Given to Date: 14 Gy
Reference Point Session Dosage Given: 2 Gy
Session Number: 34

## 2023-09-28 ENCOUNTER — Ambulatory Visit: Payer: BC Managed Care – PPO

## 2023-09-28 ENCOUNTER — Other Ambulatory Visit: Payer: Self-pay

## 2023-09-28 ENCOUNTER — Inpatient Hospital Stay: Payer: BC Managed Care – PPO

## 2023-09-28 ENCOUNTER — Ambulatory Visit
Admission: RE | Admit: 2023-09-28 | Discharge: 2023-09-28 | Disposition: A | Discharge status: Home / Self Care | Source: Ambulatory Visit | Attending: Radiation Oncology | Admitting: Radiation Oncology

## 2023-09-28 DIAGNOSIS — C50412 Malignant neoplasm of upper-outer quadrant of left female breast: Secondary | ICD-10-CM | POA: Insufficient documentation

## 2023-09-28 DIAGNOSIS — Z510 Encounter for antineoplastic radiation therapy: Secondary | ICD-10-CM | POA: Diagnosis not present

## 2023-09-28 DIAGNOSIS — Z79811 Long term (current) use of aromatase inhibitors: Secondary | ICD-10-CM | POA: Diagnosis not present

## 2023-09-28 DIAGNOSIS — Z170 Estrogen receptor positive status [ER+]: Secondary | ICD-10-CM | POA: Diagnosis not present

## 2023-09-28 LAB — CBC (CANCER CENTER ONLY)
HCT: 41.7 % (ref 36.0–46.0)
Hemoglobin: 13.6 g/dL (ref 12.0–15.0)
MCH: 27.9 pg (ref 26.0–34.0)
MCHC: 32.6 g/dL (ref 30.0–36.0)
MCV: 85.6 fL (ref 80.0–100.0)
Platelet Count: 253 10*3/uL (ref 150–400)
RBC: 4.87 MIL/uL (ref 3.87–5.11)
RDW: 13.9 % (ref 11.5–15.5)
WBC Count: 4.3 10*3/uL (ref 4.0–10.5)
nRBC: 0 % (ref 0.0–0.2)

## 2023-09-28 LAB — RAD ONC ARIA SESSION SUMMARY
Course Elapsed Days: 49
Plan Fractions Treated to Date: 8
Plan Prescribed Dose Per Fraction: 2 Gy
Plan Total Fractions Prescribed: 8
Plan Total Prescribed Dose: 16 Gy
Reference Point Dosage Given to Date: 16 Gy
Reference Point Session Dosage Given: 2 Gy
Session Number: 35

## 2023-09-29 ENCOUNTER — Encounter: Payer: Self-pay | Admitting: *Deleted

## 2023-09-29 ENCOUNTER — Ambulatory Visit
Admission: RE | Admit: 2023-09-29 | Discharge: 2023-09-29 | Disposition: A | Discharge status: Home / Self Care | Source: Ambulatory Visit | Attending: Radiation Oncology | Admitting: Radiation Oncology

## 2023-09-29 ENCOUNTER — Other Ambulatory Visit: Payer: Self-pay

## 2023-09-29 DIAGNOSIS — Z79811 Long term (current) use of aromatase inhibitors: Secondary | ICD-10-CM | POA: Diagnosis not present

## 2023-09-29 DIAGNOSIS — C50412 Malignant neoplasm of upper-outer quadrant of left female breast: Secondary | ICD-10-CM | POA: Diagnosis not present

## 2023-09-29 DIAGNOSIS — Z170 Estrogen receptor positive status [ER+]: Secondary | ICD-10-CM | POA: Diagnosis not present

## 2023-09-29 DIAGNOSIS — Z510 Encounter for antineoplastic radiation therapy: Secondary | ICD-10-CM | POA: Diagnosis not present

## 2023-09-29 LAB — RAD ONC ARIA SESSION SUMMARY
Course Elapsed Days: 50
Plan Fractions Treated to Date: 12
Plan Prescribed Dose Per Fraction: 1.8 Gy
Plan Total Fractions Prescribed: 12
Plan Total Prescribed Dose: 21.6 Gy
Reference Point Dosage Given to Date: 50.4 Gy
Reference Point Session Dosage Given: 1.8 Gy
Session Number: 36

## 2023-09-30 NOTE — Radiation Completion Notes (Signed)
 Patient Name: Audrey Ball, Audrey Ball MRN: [id number] Date of Birth: 12-19-1970 Referring Physician: Owens Shark, M.D. Date of Service: 2023-09-30 Radiation Oncologist: Carmina Miller, M.D. [facility]                             RADIATION ONCOLOGY END OF TREATMENT NOTE     Diagnosis: C50.412 Malignant neoplasm of upper-outer quadrant of left female breast Staging on 2023-07-27: Malignant neoplasm of upper-outer quadrant of left breast in female, estrogen receptor positive (HCC) T=pT2, N=pN1a, M=cM0 Staging on 2023-06-07: Malignant neoplasm of upper-outer quadrant of left breast in female, estrogen receptor positive (HCC) T=cT1c, N=cN1, M=cM0 Intent: Curative     HPI: Patient is a 53 year old female who presented with a self discovered mass in her left breast.  Mammograms confirmed a 12 mm mass at the site of palpable concern in the left breast suspicious for malignancy.  There is also enlarged left axillary lymph node.  She underwent ultrasound-guided biopsy of both positive for invasive lobular carcinoma.  Breast MRI demonstrated 3.8 cm irregular spiculated mass in the upper quadrant inner quadrant of the left breast and 2.2 cm abnormal left axillary lymph node consistent with biopsy-proven metastatic adenopathy.  She went on to have a wide local excision for a 3.6 cm invasive ductal carcinoma with lobular features with overall grade of 2.  Lymphatic and vascular invasion was present.  Margins were negative although close at 1 mm in posterior and superior margin.  There were 4 lymph nodes sentinel retrieved with 3 positive for metastatic carcinoma largest deposit being 17 mm with extranodal extension present.  She went on to have an Oncotype DX performed with a recurrence score of 12 and she will not receive systemic chemotherapy.  Postoperatively she is doing well specifically denies breast tenderness cough or bone pain.  She is now referred to radiation oncology for  opinion.      ==========DELIVERED PLANS==========  First Treatment Date: 2023-08-10 Last Treatment Date: 2023-09-29   Plan Name: Breast_L Site: Breast, Left Technique: 3D Mode: Photon Dose Per Fraction: 1.8 Gy Prescribed Dose (Delivered / Prescribed): 28.8 Gy / 28.8 Gy Prescribed Fxs (Delivered / Prescribed): 16 / 16   Plan Name: Breast_L:1 Site: Breast, Left Technique: 3D Mode: Photon Dose Per Fraction: 1.8 Gy Prescribed Dose (Delivered / Prescribed): 21.6 Gy / 21.6 Gy Prescribed Fxs (Delivered / Prescribed): 12 / 12   Plan Name: Breast_Lt_Bst Site: Breast, Left Technique: 3D Mode: Photon Dose Per Fraction: 2 Gy Prescribed Dose (Delivered / Prescribed): 16 Gy / 16 Gy Prescribed Fxs (Delivered / Prescribed): 8 / 8     ==========ON TREATMENT VISIT DATES========== 2023-08-11, 2023-08-18, 2023-08-25, 2023-09-01, 2023-09-08, 2023-09-15, 2023-09-22, 2023-09-29     ==========UPCOMING VISITS==========       ==========APPENDIX - ON TREATMENT VISIT NOTES==========   See weekly On Treatment Notes in Epic for details in the Media tab (listed as Progress notes on the On Treatment Visit Dates listed above).

## 2023-10-08 DIAGNOSIS — Z170 Estrogen receptor positive status [ER+]: Secondary | ICD-10-CM | POA: Diagnosis not present

## 2023-10-08 DIAGNOSIS — C50212 Malignant neoplasm of upper-inner quadrant of left female breast: Secondary | ICD-10-CM | POA: Diagnosis not present

## 2023-10-08 DIAGNOSIS — N63 Unspecified lump in unspecified breast: Secondary | ICD-10-CM | POA: Diagnosis not present

## 2023-10-12 ENCOUNTER — Other Ambulatory Visit: Payer: Self-pay | Admitting: General Surgery

## 2023-10-12 DIAGNOSIS — Z853 Personal history of malignant neoplasm of breast: Secondary | ICD-10-CM

## 2023-10-12 DIAGNOSIS — N63 Unspecified lump in unspecified breast: Secondary | ICD-10-CM

## 2023-10-12 DIAGNOSIS — R2232 Localized swelling, mass and lump, left upper limb: Secondary | ICD-10-CM

## 2023-10-15 ENCOUNTER — Ambulatory Visit
Admission: RE | Admit: 2023-10-15 | Discharge: 2023-10-15 | Discharge status: Home / Self Care | Source: Ambulatory Visit | Attending: General Surgery

## 2023-10-15 ENCOUNTER — Ambulatory Visit
Admission: RE | Admit: 2023-10-15 | Discharge: 2023-10-15 | Disposition: A | Discharge status: Home / Self Care | Source: Ambulatory Visit | Attending: General Surgery | Admitting: General Surgery

## 2023-10-15 DIAGNOSIS — Z853 Personal history of malignant neoplasm of breast: Secondary | ICD-10-CM

## 2023-10-15 DIAGNOSIS — N63 Unspecified lump in unspecified breast: Secondary | ICD-10-CM

## 2023-10-15 DIAGNOSIS — R2232 Localized swelling, mass and lump, left upper limb: Secondary | ICD-10-CM | POA: Diagnosis not present

## 2023-10-15 DIAGNOSIS — R921 Mammographic calcification found on diagnostic imaging of breast: Secondary | ICD-10-CM | POA: Diagnosis not present

## 2023-10-26 ENCOUNTER — Encounter: Payer: Self-pay | Admitting: Nurse Practitioner

## 2023-10-26 ENCOUNTER — Ambulatory Visit (INDEPENDENT_AMBULATORY_CARE_PROVIDER_SITE_OTHER): Payer: BC Managed Care – PPO | Admitting: Nurse Practitioner

## 2023-10-26 VITALS — BP 124/88 | HR 75 | Temp 98.4°F | Resp 16 | Ht 67.0 in | Wt 220.6 lb

## 2023-10-26 DIAGNOSIS — M545 Low back pain, unspecified: Secondary | ICD-10-CM

## 2023-10-26 DIAGNOSIS — M25561 Pain in right knee: Secondary | ICD-10-CM

## 2023-10-26 DIAGNOSIS — E782 Mixed hyperlipidemia: Secondary | ICD-10-CM | POA: Diagnosis not present

## 2023-10-26 DIAGNOSIS — F988 Other specified behavioral and emotional disorders with onset usually occurring in childhood and adolescence: Secondary | ICD-10-CM

## 2023-10-26 DIAGNOSIS — I10 Essential (primary) hypertension: Secondary | ICD-10-CM | POA: Diagnosis not present

## 2023-10-26 DIAGNOSIS — Z0001 Encounter for general adult medical examination with abnormal findings: Secondary | ICD-10-CM

## 2023-10-26 DIAGNOSIS — C50412 Malignant neoplasm of upper-outer quadrant of left female breast: Secondary | ICD-10-CM

## 2023-10-26 DIAGNOSIS — G8929 Other chronic pain: Secondary | ICD-10-CM

## 2023-10-26 DIAGNOSIS — M25562 Pain in left knee: Secondary | ICD-10-CM

## 2023-10-26 DIAGNOSIS — Z170 Estrogen receptor positive status [ER+]: Secondary | ICD-10-CM

## 2023-10-26 MED ORDER — AMPHETAMINE-DEXTROAMPHETAMINE 20 MG PO TABS: ORAL_TABLET | ORAL | 0 refills | Status: DC

## 2023-10-26 MED ORDER — HYDROCHLOROTHIAZIDE 25 MG PO TABS: 25.0000 mg | ORAL_TABLET | Freq: Every day | ORAL | 1 refills | Status: DC

## 2023-10-26 MED ORDER — TRAMADOL HCL 50 MG PO TABS
50.0000 mg | ORAL_TABLET | Freq: Every evening | ORAL | 2 refills | Status: DC | PRN
Start: 2023-10-26 — End: 2024-01-20

## 2023-10-26 NOTE — Progress Notes (Unsigned)
 [facility] [address] [place], [place] [address]  Internal MEDICINE  Office Visit Note  Patient Name: Audrey Ball  [id number]  [id number]  Date of Service: 10/26/2023  Chief Complaint  Patient presents with   Hypertension   Annual Exam    HPI Durga presents for an annual well visit and physical exam.  Well-appearing 53 y.o. female with ADHD, lower extremity edema, hypertension and arthritis.  Routine CRC screening: due in 2033 Routine mammogram: getting this checked through her oncologist.  Labs: due for routine labs  New or worsening pain: knee pain  Other concerns: started on letrozole  and verzenio. Worried about taking verzenio due to possible side effects.     Current Medication: Outpatient Encounter Medications as of 10/26/2023  Medication Sig   acetaminophen  (TYLENOL ) 650 MG CR tablet Take 650 mg by mouth at bedtime.   betamethasone  valerate ointment (VALISONE ) 0.1 % Apply 1 Application topically daily as needed (rash). To affected area until resolved.   Cholecalciferol (VITAMIN D) 50 MCG (2000 UT) tablet Take 2,000 Units by mouth daily.   letrozole  (FEMARA ) 2.5 MG tablet Take 1 tablet (2.5 mg total) by mouth daily.   meloxicam  (MOBIC ) 15 MG tablet Take 1 tablet (15 mg total) by mouth daily. (Patient taking differently: Take 15 mg by mouth at bedtime.)   [DISCONTINUED] amphetamine -dextroamphetamine  (ADDERALL) 20 MG tablet TAKE 1 TABLET BY MOUTH TWICE A DAY AS NEEDED FOR FOCUS   [DISCONTINUED] amphetamine -dextroamphetamine  (ADDERALL) 20 MG tablet TAKE 1 TABLET BY MOUTH TWICE A DAY AS NEEDED FOR FOCUS   [DISCONTINUED] amphetamine -dextroamphetamine  (ADDERALL) 20 MG tablet TAKE 1 TABLET BY MOUTH TWICE A DAY AS NEEDED FOR FOCUS   [DISCONTINUED] hydrochlorothiazide  (HYDRODIURIL ) 25 MG tablet TAKE 1 TABLET (25 MG TOTAL) BY MOUTH DAILY.   [DISCONTINUED] oxyCODONE -acetaminophen  (PERCOCET) 5-325 MG tablet Take 1 tablet by mouth every 4 (four) hours as needed for  severe pain (pain score 7-10).   [DISCONTINUED] traMADol  (ULTRAM ) 50 MG tablet Take 1 tablet (50 mg total) by mouth every 6 (six) hours as needed for moderate pain or severe pain.   [START ON 12/21/2023] amphetamine -dextroamphetamine  (ADDERALL) 20 MG tablet TAKE 1 TABLET BY MOUTH TWICE A DAY AS NEEDED FOR FOCUS   [START ON 11/23/2023] amphetamine -dextroamphetamine  (ADDERALL) 20 MG tablet TAKE 1 TABLET BY MOUTH TWICE A DAY AS NEEDED FOR FOCUS   amphetamine -dextroamphetamine  (ADDERALL) 20 MG tablet TAKE 1 TABLET BY MOUTH TWICE A DAY AS NEEDED FOR FOCUS   hydrochlorothiazide  (HYDRODIURIL ) 25 MG tablet Take 1 tablet (25 mg total) by mouth daily.   traMADol  (ULTRAM ) 50 MG tablet Take 1 tablet (50 mg total) by mouth at bedtime as needed for moderate pain (pain score 4-6) or severe pain (pain score 7-10).   No facility-administered encounter medications on file as of 10/26/2023.    Surgical History: Past Surgical History:  Procedure Laterality Date   BACK SURGERY  2017   BREAST BIOPSY Left 05/27/2023   u/s bx,mass 10:00, RIBBON clip-path pending   BREAST BIOPSY Left 05/27/2023   u/s bx, axilla/BUTTERFLY clippath pending   BREAST BIOPSY Left 05/27/2023   US  LT BREAST BX W LOC DEV 1ST LESION IMG BX SPEC US  GUIDE 05/27/2023 [facility]   BREAST BIOPSY Left 06/15/2023   US  LT RADIO FREQUENCY TAG LOC US  GUIDE 06/15/2023 [facility]   BREAST BIOPSY Left 06/15/2023   US  LT RADIO FREQUENCY TAG EA ADD LESION LOC US  GUIDE 06/15/2023 [facility]   COLONOSCOPY WITH PROPOFOL  N/A 08/16/2021   Procedure: COLONOSCOPY WITH PROPOFOL ;  Surgeon: Selena Daily, MD;  Location: [facility] ENDOSCOPY;  Service: Gastroenterology;  Laterality: N/A;   GALLBLADDER SURGERY  1999   PART MASTECTOMY,RADIO FREQUENCY LOCALIZER,AXILLARY SENTINEL NODE BIOPSY Left 06/22/2023   Procedure: PART MASTECTOMY,RADIO FREQUENCY LOCALIZER,AXILLARY SENTINEL NODE BIOPSY;  Surgeon: Eldred Grego, MD;  Location: [facility] ORS;   Service: General;  Laterality: Left;   TOTAL VAGINAL HYSTERECTOMY  2016   bilateral salpingectomy at [facility] for abnormal uterine bleeding    Medical History: Past Medical History:  Diagnosis Date   Breast cancer (HCC)    Hypertension     Family History: Family History  Problem Relation Age of Onset   Leukemia Mother 45   Hypertension Mother    Diabetes Mother    Lung cancer Father 90   Throat cancer Brother        Currently in remission.   Lung cancer Maternal Grandmother        d.>50   Breast cancer Paternal Grandmother        dx 69s    Social History   Socioeconomic History   Marital status: Single    Spouse name: Not on file   Number of children: Not on file   Years of education: Not on file   Highest education level: Not on file  Occupational History   Not on file  Tobacco Use   Smoking status: Never   Smokeless tobacco: Never  Vaping Use   Vaping status: Never Used  Substance and Sexual Activity   Alcohol use: No   Drug use: No   Sexual activity: Yes    Birth control/protection: Surgical  Other Topics Concern   Not on file  Social History Narrative   Not on file   Social Drivers of Health   Financial Resource Strain: Low Risk  (06/04/2023)   Received from [facility]   Overall Financial Resource Strain (CARDIA)    Difficulty of Paying Living Expenses: Not hard at all  Food Insecurity: No Food Insecurity (06/04/2023)   Received from [facility]   Hunger Vital Sign    Worried About Running Out of Food in the Last Year: Never true    Ran Out of Food in the Last Year: Never true  Transportation Needs: No Transportation Needs (06/04/2023)   Received from [facility] - Transportation    In the past 12 months, has lack of transportation kept you from medical appointments or from getting medications?: No    Lack of Transportation (Non-Medical): No  Physical Activity: Not on file  Stress: Not  on file  Social Connections: Not on file  Intimate Partner Violence: Not At Risk (06/03/2023)   Humiliation, Afraid, Rape, and Kick questionnaire    Fear of Current or Ex-Partner: No    Emotionally Abused: No    Physically Abused: No    Sexually Abused: No      Review of Systems  Constitutional:  Negative for activity change, appetite change, chills, fatigue, fever and unexpected weight change.  HENT: Negative.  Negative for congestion, ear pain, rhinorrhea, sore throat and trouble swallowing.   Eyes: Negative.   Respiratory: Negative.  Negative for cough, chest tightness, shortness of breath and wheezing.   Cardiovascular: Negative.  Negative for chest pain.  Gastrointestinal: Negative.  Negative for abdominal pain, blood in stool, constipation, diarrhea, nausea and vomiting.  Endocrine: Negative.   Genitourinary: Negative.  Negative for difficulty urinating, dysuria, frequency, hematuria and urgency.  Musculoskeletal: Negative.  Negative  for arthralgias, back pain, joint swelling, myalgias and neck pain.  Skin: Negative.  Negative for rash and wound.  Allergic/Immunologic: Negative.  Negative for immunocompromised state.  Neurological: Negative.  Negative for dizziness, seizures, numbness and headaches.  Hematological: Negative.   Psychiatric/Behavioral: Negative.  Negative for behavioral problems, self-injury and suicidal ideas. The patient is not nervous/anxious.     Vital Signs: BP 124/88   Pulse 75   Temp 98.4 F (36.9 C)   Resp 16   Ht 5\' 7"  (1.702 m)   Wt 220 lb 9.6 oz (100.1 kg)   SpO2 97%   BMI 34.55 kg/m    Physical Exam Vitals reviewed.  Constitutional:      General: She is awake. She is not in acute distress.    Appearance: Normal appearance. She is well-developed and well-groomed. She is obese. She is not ill-appearing or diaphoretic.  HENT:     Head: Normocephalic and atraumatic.     Right Ear: Tympanic membrane, ear canal and external ear normal.      Left Ear: Tympanic membrane, ear canal and external ear normal.     Nose: Nose normal. No congestion or rhinorrhea.     Mouth/Throat:     Lips: Pink.     Mouth: Mucous membranes are moist.     Pharynx: Oropharynx is clear. Uvula midline. No oropharyngeal exudate or posterior oropharyngeal erythema.  Eyes:     General: Lids are normal. Vision grossly intact. Gaze aligned appropriately. No scleral icterus.       Right eye: No discharge.        Left eye: No discharge.     Extraocular Movements: Extraocular movements intact.     Conjunctiva/sclera: Conjunctivae normal.     Pupils: Pupils are equal, round, and reactive to light.     Funduscopic exam:    Right eye: Red reflex present.        Left eye: Red reflex present. Neck:     Thyroid: No thyromegaly.     Vascular: No JVD.     Trachea: No tracheal deviation.  Cardiovascular:     Rate and Rhythm: Normal rate and regular rhythm.     Heart sounds: Normal heart sounds, S1 normal and S2 normal. No murmur heard.    No friction rub. No gallop.  Pulmonary:     Effort: Pulmonary effort is normal. No accessory muscle usage or respiratory distress.     Breath sounds: Normal breath sounds and air entry. No stridor. No wheezing or rales.  Chest:     Chest wall: No tenderness.  Abdominal:     General: Bowel sounds are normal. There is no distension.     Palpations: Abdomen is soft. There is no shifting dullness, fluid wave, mass or pulsatile mass.     Tenderness: There is no abdominal tenderness. There is no guarding or rebound.  Musculoskeletal:        General: No tenderness or deformity. Normal range of motion.     Cervical back: Normal range of motion and neck supple.     Right lower leg: No edema.     Left lower leg: No edema.  Lymphadenopathy:     Cervical: No cervical adenopathy.  Skin:    General: Skin is warm and dry.     Capillary Refill: Capillary refill takes less than 2 seconds.     Coloration: Skin is not pale.     Findings:  No erythema or rash.  Neurological:     Mental  Status: She is alert and oriented to person, place, and time.     Cranial Nerves: No cranial nerve deficit.     Motor: No abnormal muscle tone.     Coordination: Coordination normal.     Gait: Gait normal.     Deep Tendon Reflexes: Reflexes are normal and symmetric.  Psychiatric:        Mood and Affect: Mood normal.        Behavior: Behavior normal. Behavior is cooperative.        Thought Content: Thought content normal.        Judgment: Judgment normal.        Assessment/Plan: 1. Encounter for routine adult health examination with abnormal findings (Primary) Age-appropriate preventive screenings and vaccinations discussed, annual physical exam completed. Routine labs for health maintenance ordered, see below. PHM updated.   - CBC with Differential/Platelet - CMP14+EGFR - Lipid Profile  2. Essential hypertension Continue hydrochlorothiazide  as prescribed. Routine labs ordered  - CBC with Differential/Platelet - CMP14+EGFR - Lipid Profile - hydrochlorothiazide  (HYDRODIURIL ) 25 MG tablet; Take 1 tablet (25 mg total) by mouth daily.  Dispense: 90 tablet; Refill: 1  3. Mixed hyperlipidemia Routine labs ordered  - CBC with Differential/Platelet - CMP14+EGFR - Lipid Profile  4. Acute pain of both knees Take tramadol  as needed. Has appt this week with orthopedic surgery.   5. Chronic bilateral low back pain without sciatica Continue prn tramadol  as prescribed. Follow up in 3 months for additional refills.  - traMADol  (ULTRAM ) 50 MG tablet; Take 1 tablet (50 mg total) by mouth at bedtime as needed for moderate pain (pain score 4-6) or severe pain (pain score 7-10).  Dispense: 30 tablet; Refill: 2  6. Malignant neoplasm of upper-outer quadrant of left breast in female, estrogen receptor positive (HCC) S/p lumpectomy and radiation. Now she is on letrozole  and will be starting verzenio.   7. Attention deficit disorder of  adult Continue adderall as prescribed. Follow up in 3 months for additional refills.  - amphetamine -dextroamphetamine  (ADDERALL) 20 MG tablet; TAKE 1 TABLET BY MOUTH TWICE A DAY AS NEEDED FOR FOCUS  Dispense: 60 tablet; Refill: 0 - amphetamine -dextroamphetamine  (ADDERALL) 20 MG tablet; TAKE 1 TABLET BY MOUTH TWICE A DAY AS NEEDED FOR FOCUS  Dispense: 60 tablet; Refill: 0 - amphetamine -dextroamphetamine  (ADDERALL) 20 MG tablet; TAKE 1 TABLET BY MOUTH TWICE A DAY AS NEEDED FOR FOCUS  Dispense: 60 tablet; Refill: 0    General Counseling: Korrine verbalizes understanding of the findings of todays visit and agrees with plan of treatment. I have discussed any further diagnostic evaluation that may be needed or ordered today. We also reviewed her medications today. she has been encouraged to call the office with any questions or concerns that should arise related to todays visit.    Orders Placed This Encounter  Procedures   CBC with Differential/Platelet   CMP14+EGFR   Lipid Profile    Meds ordered this encounter  Medications   amphetamine -dextroamphetamine  (ADDERALL) 20 MG tablet    Sig: TAKE 1 TABLET BY MOUTH TWICE A DAY AS NEEDED FOR FOCUS    Dispense:  60 tablet    Refill:  0    Fill for June 30   amphetamine -dextroamphetamine  (ADDERALL) 20 MG tablet    Sig: TAKE 1 TABLET BY MOUTH TWICE A DAY AS NEEDED FOR FOCUS    Dispense:  60 tablet    Refill:  0    Fill for June 2   amphetamine -dextroamphetamine  (ADDERALL) 20 MG tablet  Sig: TAKE 1 TABLET BY MOUTH TWICE A DAY AS NEEDED FOR FOCUS    Dispense:  60 tablet    Refill:  0    Fill for may 5   traMADol  (ULTRAM ) 50 MG tablet    Sig: Take 1 tablet (50 mg total) by mouth at bedtime as needed for moderate pain (pain score 4-6) or severe pain (pain score 7-10).    Dispense:  30 tablet    Refill:  2    For refill   hydrochlorothiazide  (HYDRODIURIL ) 25 MG tablet    Sig: Take 1 tablet (25 mg total) by mouth daily.    Dispense:  90  tablet    Refill:  1    Return in about 3 months (around 01/20/2024) for F/U, ADHD med check, pain med refill, Kenneth Cuaresma PCP.   Total time spent:30 Minutes Time spent includes review of chart, medications, test results, and follow up plan with the patient.   [place] Controlled Substance Database was reviewed by me.  This patient was seen by Laurence Pons, FNP-C in collaboration with Dr. Verneta Gone as a part of collaborative care agreement.  Addaleigh Nicholls R. Bobbi Burow, MSN, FNP-C Internal medicine

## 2023-10-27 ENCOUNTER — Encounter: Payer: Self-pay | Admitting: Nurse Practitioner

## 2023-10-29 ENCOUNTER — Encounter: Payer: Self-pay | Admitting: Radiation Oncology

## 2023-10-29 ENCOUNTER — Ambulatory Visit
Admission: RE | Admit: 2023-10-29 | Discharge: 2023-10-29 | Disposition: A | Discharge status: Home / Self Care | Source: Ambulatory Visit | Attending: Oncology | Admitting: Oncology

## 2023-10-29 VITALS — BP 140/83 | HR 91 | Resp 16 | Wt 217.0 lb

## 2023-10-29 DIAGNOSIS — C50412 Malignant neoplasm of upper-outer quadrant of left female breast: Secondary | ICD-10-CM | POA: Insufficient documentation

## 2023-10-29 DIAGNOSIS — M170 Bilateral primary osteoarthritis of knee: Secondary | ICD-10-CM | POA: Diagnosis not present

## 2023-10-29 NOTE — Progress Notes (Signed)
 Radiation Oncology Follow up Note  Name: Audrey Ball   Date:   10/29/2023 MRN:  [id number] DOB: July 02, 1970    This 53 y.o. 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  REFERRING PROVIDER: Laurence Pons, NP  HPI: Patient is a 53 year old female now out 1 month completed whole breast radiation for stage IIb invasive lobular carcinoma the left breast.  Seen today in routine follow-up she is doing well.  She specifically denies breast tenderness cough or bone pain..  She has been started on letrozole  which she seems to be tolerating well.  She recently had injections in both knees for osteoarthritis.  COMPLICATIONS OF TREATMENT: none  FOLLOW UP COMPLIANCE: keeps appointments   PHYSICAL EXAM:  BP (!) 140/83   Pulse 91   Resp 16   Wt 217 lb (98.4 kg)   BMI 33.99 kg/m  Lungs are clear to A&P cardiac examination essentially unremarkable with regular rate and rhythm. No dominant mass or nodularity is noted in either breast in 2 positions examined. Incision is well-healed. No axillary or supraclavicular adenopathy is appreciated. Cosmetic result is excellent.  Well-developed well-nourished patient in NAD. HEENT reveals PERLA, EOMI, discs not visualized.  Oral cavity is clear. No oral mucosal lesions are identified. Neck is clear without evidence of cervical or supraclavicular adenopathy. Lungs are clear to A&P. Cardiac examination is essentially unremarkable with regular rate and rhythm without murmur rub or thrill. Abdomen is benign with no organomegaly or masses noted. Motor sensory and DTR levels are equal and symmetric in the upper and lower extremities. Cranial nerves II through XII are grossly intact. Proprioception is intact. No peripheral adenopathy or edema is identified. No motor or sensory  levels are noted. Crude visual fields are within normal range.  RADIOLOGY RESULTS: No current films for review  PLAN: The present time patient is doing well 1 month out from whole breast radiation.  She continues on letrozole  without side effect.  Of asked to see her back in 6 months for follow-up.  Patient knows to call sooner with any concerns.  She has mentions possibility of wanting to have radiation to her bilateral knees for osteoarthritis.  I told her we will discuss at her next visit.  I would like to take this opportunity to thank you for allowing me to participate in the care of your patient.Glenis Langdon, MD

## 2023-11-05 ENCOUNTER — Other Ambulatory Visit: Payer: Self-pay | Admitting: Nurse Practitioner

## 2023-11-05 DIAGNOSIS — G8929 Other chronic pain: Secondary | ICD-10-CM

## 2023-11-05 NOTE — Telephone Encounter (Signed)
 Please review ok to send

## 2023-11-10 ENCOUNTER — Inpatient Hospital Stay (HOSPITAL_BASED_OUTPATIENT_CLINIC_OR_DEPARTMENT_OTHER): Admitting: Oncology

## 2023-11-10 ENCOUNTER — Inpatient Hospital Stay: Attending: Oncology

## 2023-11-10 ENCOUNTER — Other Ambulatory Visit (HOSPITAL_COMMUNITY): Payer: Self-pay

## 2023-11-10 ENCOUNTER — Telehealth: Payer: Self-pay | Admitting: *Deleted

## 2023-11-10 ENCOUNTER — Inpatient Hospital Stay: Admitting: Pharmacist

## 2023-11-10 ENCOUNTER — Telehealth: Payer: Self-pay | Admitting: Pharmacy Technician

## 2023-11-10 ENCOUNTER — Encounter: Payer: Self-pay | Admitting: Pharmacist

## 2023-11-10 ENCOUNTER — Encounter: Payer: Self-pay | Admitting: Oncology

## 2023-11-10 VITALS — BP 137/84 | HR 72 | Temp 98.6°F | Resp 20 | Wt 217.6 lb

## 2023-11-10 DIAGNOSIS — Z1721 Progesterone receptor positive status: Secondary | ICD-10-CM | POA: Diagnosis not present

## 2023-11-10 DIAGNOSIS — Z170 Estrogen receptor positive status [ER+]: Secondary | ICD-10-CM | POA: Diagnosis not present

## 2023-11-10 DIAGNOSIS — Z1732 Human epidermal growth factor receptor 2 negative status: Secondary | ICD-10-CM | POA: Insufficient documentation

## 2023-11-10 DIAGNOSIS — C50412 Malignant neoplasm of upper-outer quadrant of left female breast: Secondary | ICD-10-CM | POA: Diagnosis not present

## 2023-11-10 DIAGNOSIS — Z79811 Long term (current) use of aromatase inhibitors: Secondary | ICD-10-CM | POA: Insufficient documentation

## 2023-11-10 DIAGNOSIS — Z79899 Other long term (current) drug therapy: Secondary | ICD-10-CM

## 2023-11-10 DIAGNOSIS — Z923 Personal history of irradiation: Secondary | ICD-10-CM | POA: Diagnosis not present

## 2023-11-10 LAB — CMP (CANCER CENTER ONLY)
ALT: 31 U/L (ref 0–44)
AST: 25 U/L (ref 15–41)
Albumin: 4 g/dL (ref 3.5–5.0)
Alkaline Phosphatase: 98 U/L (ref 38–126)
Anion gap: 9 (ref 5–15)
BUN: 12 mg/dL (ref 6–20)
CO2: 30 mmol/L (ref 22–32)
Calcium: 9.3 mg/dL (ref 8.9–10.3)
Chloride: 98 mmol/L (ref 98–111)
Creatinine: 0.6 mg/dL (ref 0.44–1.00)
GFR, Estimated: >60 mL/min (ref >60)
Glucose, Bld: 114 mg/dL — ABNORMAL HIGH (ref 70–99)
Potassium: 3.2 mmol/L — ABNORMAL LOW (ref 3.5–5.1)
Sodium: 137 mmol/L (ref 135–145)
Total Bilirubin: 0.5 mg/dL (ref 0.0–1.2)
Total Protein: 8.3 g/dL — ABNORMAL HIGH (ref 6.5–8.1)

## 2023-11-10 LAB — CBC WITH DIFFERENTIAL (CANCER CENTER ONLY)
Abs Immature Granulocytes: 0.02 10*3/uL (ref 0.00–0.07)
Basophils Absolute: 0.1 10*3/uL (ref 0.0–0.1)
Basophils Relative: 1 %
Eosinophils Absolute: 0.1 10*3/uL (ref 0.0–0.5)
Eosinophils Relative: 3 %
HCT: 42.5 % (ref 36.0–46.0)
Hemoglobin: 14.1 g/dL (ref 12.0–15.0)
Immature Granulocytes: 0 %
Lymphocytes Relative: 22 %
Lymphs Abs: 1 10*3/uL (ref 0.7–4.0)
MCH: 27.8 pg (ref 26.0–34.0)
MCHC: 33.2 g/dL (ref 30.0–36.0)
MCV: 83.8 fL (ref 80.0–100.0)
Monocytes Absolute: 0.5 10*3/uL (ref 0.1–1.0)
Monocytes Relative: 10 %
Neutro Abs: 2.9 10*3/uL (ref 1.7–7.7)
Neutrophils Relative %: 64 %
Platelet Count: 238 10*3/uL (ref 150–400)
RBC: 5.07 MIL/uL (ref 3.87–5.11)
RDW: 13.7 % (ref 11.5–15.5)
WBC Count: 4.5 10*3/uL (ref 4.0–10.5)
nRBC: 0 % (ref 0.0–0.2)

## 2023-11-10 MED ORDER — ONDANSETRON HCL 8 MG PO TABS: 8.0000 mg | ORAL_TABLET | Freq: Three times a day (TID) | ORAL | 3 refills | Status: AC | PRN

## 2023-11-10 MED ORDER — PROCHLORPERAZINE MALEATE 10 MG PO TABS: 10.0000 mg | ORAL_TABLET | Freq: Four times a day (QID) | ORAL | 3 refills | Status: AC | PRN

## 2023-11-10 MED ORDER — ABEMACICLIB 100 MG PO TABS: 100.0000 mg | ORAL_TABLET | Freq: Two times a day (BID) | ORAL | 0 refills | Status: DC

## 2023-11-10 NOTE — Telephone Encounter (Addendum)
 Oral Oncology Patient Advocate Encounter  After completing a benefits investigation, prior authorization for abemaciclib is not required at this time through [employer].  Patient must fill at [facility].  Supporting documents have been sent to [facility].  [name], CPhT Oncology Pharmacy Patient Advocate [facility] Direct Number: [phone] Fax: [phone]

## 2023-11-10 NOTE — Telephone Encounter (Signed)
 [facility] called and said they wanted to start kit for the patient.  I looked in Dr. Denese Finn note she said:Given patient's concern for side effects I will plan to give her a reduced dose of 100 mg twice daily to begin with and if she is tolerating that well without any significant GI side effects or cytopenias we can consider moving it up to 150 mg twice daily .  I do not know whether or not the starter kit with work based on she is starting her on a reduced dose.  I am sending this message to Alyson to make sure about the starter kit if it comes in a smaller amount of the medicine

## 2023-11-10 NOTE — Progress Notes (Signed)
 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  This was followed by diagnostic bilateral mammogram which showed a 12 mm mass at the site of palpable concern concerning for malignancy along with nonenlarged left axillary lymph node.  Ultrasound also confirmed the same findings.  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  Tumor was ER 100% positive, PR 70% positive and HER2 negative +1.   Final pathology showed tumor size of 3.6 cm with negative margins grade 2 invasive ductal carcinoma with lobular features.  3 out of 5 sentinel lymph nodes positive for malignancy.  2 of them had macrometastases and 1 with micrometastases.  Extranodal extension present.  Oncotype testing was sent which came back at intermediate risk with a recurrence score of 12.  She did not require any adjuvant chemotherapy and was started on letrozole  in March 2025.  She completed adjuvant radiation therapy.    Interval history-she is tolerating letrozole  well without any significant side effects.  She is on vitamin D but she is not taking her calcium supplements yet.  ECOG PS- 1 Pain scale-  0   Review of systems- Review of Systems  Constitutional:  Negative for chills, fever, malaise/fatigue and weight loss.  HENT:  Negative for congestion, ear discharge and nosebleeds.   Eyes:  Negative for blurred vision.  Respiratory:  Negative for cough, hemoptysis, sputum production, shortness of breath and wheezing.   Cardiovascular:  Negative for chest pain, palpitations, orthopnea and claudication.  Gastrointestinal:  Negative for abdominal pain, blood in stool, constipation, diarrhea, heartburn, melena, nausea and vomiting.  Genitourinary:  Negative for dysuria, flank pain, frequency, hematuria and urgency.  Musculoskeletal:  Negative for back pain, joint pain and myalgias.  Skin:  Negative for rash.  Neurological:  Negative for dizziness, tingling, focal weakness, seizures, weakness and headaches.  Endo/Heme/Allergies:  Does not bruise/bleed easily.  Psychiatric/Behavioral:  Negative for depression and suicidal ideas. The patient does not have insomnia.       Allergies  Allergen Reactions   Bactrim  [Sulfamethoxazole -Trimethoprim ] Hives and Swelling    Swelling in lips and leg   Sulfa  Antibiotics Hives   Penicillins Rash   Propoxyphene Other (See Comments)    headache      Past Medical History:  Diagnosis Date   Breast cancer (HCC)    Hypertension      Past Surgical History:  Procedure Laterality Date   BACK SURGERY  2017   BREAST BIOPSY Left 05/27/2023   u/s bx,mass 10:00, RIBBON clip-path pending   BREAST BIOPSY Left 05/27/2023   u/s bx, axilla/BUTTERFLY clippath pending   BREAST BIOPSY Left 05/27/2023   US  LT BREAST BX W LOC DEV 1ST LESION IMG BX SPEC US  GUIDE 05/27/2023 [facility]  BREAST BIOPSY Left 06/15/2023   US  LT RADIO FREQUENCY TAG LOC US  GUIDE 06/15/2023 [facility]   BREAST BIOPSY Left 06/15/2023   US  LT RADIO FREQUENCY TAG EA ADD LESION LOC US  GUIDE 06/15/2023 [facility]   COLONOSCOPY WITH PROPOFOL  N/A 08/16/2021   Procedure:  COLONOSCOPY WITH PROPOFOL ;  Surgeon: Selena Daily, MD;  Location: [facility] ENDOSCOPY;  Service: Gastroenterology;  Laterality: N/A;   GALLBLADDER SURGERY  1999   PART MASTECTOMY,RADIO FREQUENCY LOCALIZER,AXILLARY SENTINEL NODE BIOPSY Left 06/22/2023   Procedure: PART MASTECTOMY,RADIO FREQUENCY LOCALIZER,AXILLARY SENTINEL NODE BIOPSY;  Surgeon: Eldred Grego, MD;  Location: [facility] ORS;  Service: General;  Laterality: Left;   TOTAL VAGINAL HYSTERECTOMY  2016   bilateral salpingectomy at [facility] for abnormal uterine bleeding    Social History   Socioeconomic History   Marital status: Single    Spouse name: Not on file   Number of children: Not on file   Years of education: Not on file   Highest education level: Not on file  Occupational History   Not on file  Tobacco Use   Smoking status: Never   Smokeless tobacco: Never  Vaping Use   Vaping status: Never Used  Substance and Sexual Activity   Alcohol use: No   Drug use: No   Sexual activity: Yes    Birth control/protection: Surgical  Other Topics Concern   Not on file  Social History Narrative   Not on file   Social Drivers of Health   Financial Resource Strain: Low Risk  (06/04/2023)   Received from [facility]   Overall Financial Resource Strain (CARDIA)    Difficulty of Paying Living Expenses: Not hard at all  Food Insecurity: No Food Insecurity (06/04/2023)   Received from [facility]   Hunger Vital Sign    Worried About Running Out of Food in the Last Year: Never true    Ran Out of Food in the Last Year: Never true  Transportation Needs: No Transportation Needs (06/04/2023)   Received from [facility] - Transportation    In the past 12 months, has lack of transportation kept you from medical appointments or from getting medications?: No    Lack of Transportation (Non-Medical): No  Physical Activity: Not on file  Stress: Not on file  Social  Connections: Not on file  Intimate Partner Violence: Not At Risk (06/03/2023)   Humiliation, Afraid, Rape, and Kick questionnaire    Fear of Current or Ex-Partner: No    Emotionally Abused: No    Physically Abused: No    Sexually Abused: No    Family History  Problem Relation Age of Onset   Leukemia Mother 10   Hypertension Mother    Diabetes Mother    Lung cancer Father 32   Throat cancer Brother        Currently in remission.   Lung cancer Maternal Grandmother        d.>50   Breast cancer Paternal Grandmother        dx 58s     Current Outpatient Medications:    acetaminophen  (TYLENOL ) 650 MG CR tablet, Take 650 mg by mouth at bedtime., Disp: , Rfl:    [START ON 12/21/2023] amphetamine -dextroamphetamine  (ADDERALL) 20 MG tablet, TAKE 1 TABLET BY MOUTH TWICE A DAY AS NEEDED FOR FOCUS, Disp: 60 tablet, Rfl: 0   [START ON 11/23/2023] amphetamine -dextroamphetamine  (ADDERALL) 20 MG tablet, TAKE 1 TABLET BY MOUTH TWICE A DAY AS NEEDED FOR FOCUS, Disp:  60 tablet, Rfl: 0   amphetamine -dextroamphetamine  (ADDERALL) 20 MG tablet, TAKE 1 TABLET BY MOUTH TWICE A DAY AS NEEDED FOR FOCUS, Disp: 60 tablet, Rfl: 0   betamethasone  valerate ointment (VALISONE ) 0.1 %, Apply 1 Application topically daily as needed (rash). To affected area until resolved., Disp: 45 g, Rfl: 5   Cholecalciferol (VITAMIN D) 50 MCG (2000 UT) tablet, Take 2,000 Units by mouth daily., Disp: , Rfl:    hydrochlorothiazide  (HYDRODIURIL ) 25 MG tablet, Take 1 tablet (25 mg total) by mouth daily., Disp: 90 tablet, Rfl: 1   letrozole  (FEMARA ) 2.5 MG tablet, Take 1 tablet (2.5 mg total) by mouth daily., Disp: 30 tablet, Rfl: 3   meloxicam  (MOBIC ) 15 MG tablet, TAKE 1 TABLET (15 MG TOTAL) BY MOUTH DAILY., Disp: 90 tablet, Rfl: 1   traMADol  (ULTRAM ) 50 MG tablet, Take 1 tablet (50 mg total) by mouth at bedtime as needed for moderate pain (pain score 4-6) or severe pain (pain score 7-10)., Disp: 30 tablet, Rfl: 2   abemaciclib (VERZENIO)  100 MG tablet, Take 1 tablet (100 mg total) by mouth 2 (two) times daily., Disp: 56 tablet, Rfl: 0   ondansetron  (ZOFRAN ) 8 MG tablet, Take 1 tablet (8 mg total) by mouth every 8 (eight) hours as needed for nausea or vomiting., Disp: 20 tablet, Rfl: 3   prochlorperazine (COMPAZINE) 10 MG tablet, Take 1 tablet (10 mg total) by mouth every 6 (six) hours as needed for vomiting or nausea., Disp: 30 tablet, Rfl: 3  Physical exam:  Vitals:   11/10/23 1117  BP: 137/84  Pulse: 72  Resp: 20  Temp: 98.6 F (37 C)  SpO2: 100%  Weight: 217 lb 9.6 oz (98.7 kg)   Physical Exam Cardiovascular:     Rate and Rhythm: Normal rate and regular rhythm.     Heart sounds: Normal heart sounds.  Pulmonary:     Effort: Pulmonary effort is normal.     Breath sounds: Normal breath sounds.  Skin:    General: Skin is warm and dry.  Neurological:     Mental Status: She is alert and oriented to person, place, and time.      I have personally reviewed labs listed below:    Latest Ref Rng & Units 11/10/2023   11:08 AM  CMP  Glucose 70 - 99 mg/dL 161   BUN 6 - 20 mg/dL 12   Creatinine 0.96 - 1.00 mg/dL 0.45   Sodium 409 - 811 mmol/L 137   Potassium 3.5 - 5.1 mmol/L 3.2   Chloride 98 - 111 mmol/L 98   CO2 22 - 32 mmol/L 30   Calcium 8.9 - 10.3 mg/dL 9.3   Total Protein 6.5 - 8.1 g/dL 8.3   Total Bilirubin 0.0 - 1.2 mg/dL 0.5   Alkaline Phos 38 - 126 U/L 98   AST 15 - 41 U/L 25   ALT 0 - 44 U/L 31       Latest Ref Rng & Units 11/10/2023   11:08 AM  CBC  WBC 4.0 - 10.5 K/uL 4.5   Hemoglobin 12.0 - 15.0 g/dL 91.4   Hematocrit 78.2 - 46.0 % 42.5   Platelets 150 - 400 K/uL 238    I have personally reviewed Radiology images listed below: No images are attached to the encounter.  MM 3D DIAGNOSTIC MAMMOGRAM UNILATERAL LEFT BREAST Result Date: 10/15/2023 CLINICAL DATA:  53 year old female presenting with a palpable area of concern in the LEFT supraclavicular lesion. She is status post  left breast  lumpectomy for invasive ductal carcinoma with lobular features and December 2024, with negative margins. 3/5 sentinel lymph nodes were positive for malignancy, with 2 of them demonstrating macro metastases and 1 of them demonstrating micrometastases. The patient completed adjuvant radiation therapy and is taking letrozole . EXAM: DIGITAL DIAGNOSTIC UNILATERAL LEFT MAMMOGRAM WITH TOMOSYNTHESIS AND CAD; ULTRASOUND LEFT BREAST LIMITED TECHNIQUE: Left digital diagnostic mammography and breast tomosynthesis was performed. The images were evaluated with computer-aided detection. ; Targeted ultrasound examination of the left breast was performed. COMPARISON:  Previous exam(s). ACR Breast Density Category b: There are scattered areas of fibroglandular density. FINDINGS: MAMMOGRAM: Subjacent to the palpable marker in the upper far outer left breast, there is a partially visualized asymmetry measuring approximately 5.0 cm (XCCL image 40/124), with an associated benign dystrophic calcification that measures up to 5 mm. This calcification is mammographically stable since at least December 2024. Interval density with architectural distortion in the upper inner left breast middle depth, consistent with post lumpectomy changes. There is also interval density/distortion in the left axilla, consistent with postsurgical changes. There is diffuse left breast skin thickening, consistent with post radiation changes. No new suspicious mass, calcification, or other findings are identified in the left breast. ULTRASOUND: On physical exam, there is a superficial mobile pea-sized mass felt in the upper left breast, corresponding with the patient's area of palpable concern. Targeted left breast ultrasound was performed by the sonographer and physician in the area of palpable concern. At 1 o'clock 19 cm from nipple, there is a dystrophic calcification measuring up to 6 mm. This corresponds with the dystrophic calcifications seen on mammogram.  There is surrounding echogenic fat. Overall, findings are consistent with benign fat necrosis. IMPRESSION: 1. The area of palpable concern in the LEFT breast corresponds with a benign dystrophic calcification. 2. Interval post treatment changes in the left breast and axilla. 3. No mammographic evidence of malignancy in the left breast. RECOMMENDATION: BILATERAL diagnostic mammogram in December 2025, to continue with post lumpectomy surveillance protocol. I have discussed the findings and recommendations with the patient. If applicable, a reminder letter will be sent to the patient regarding the next appointment. BI-RADS CATEGORY  2: Benign. Electronically Signed   By: Sande Cromer M.D.   On: 10/15/2023 16:20   US  LIMITED ULTRASOUND INCLUDING AXILLA LEFT BREAST  Result Date: 10/15/2023 CLINICAL DATA:  53 year old female presenting with a palpable area of concern in the LEFT supraclavicular lesion. She is status post left breast lumpectomy for invasive ductal carcinoma with lobular features and December 2024, with negative margins. 3/5 sentinel lymph nodes were positive for malignancy, with 2 of them demonstrating macro metastases and 1 of them demonstrating micrometastases. The patient completed adjuvant radiation therapy and is taking letrozole . EXAM: DIGITAL DIAGNOSTIC UNILATERAL LEFT MAMMOGRAM WITH TOMOSYNTHESIS AND CAD; ULTRASOUND LEFT BREAST LIMITED TECHNIQUE: Left digital diagnostic mammography and breast tomosynthesis was performed. The images were evaluated with computer-aided detection. ; Targeted ultrasound examination of the left breast was performed. COMPARISON:  Previous exam(s). ACR Breast Density Category b: There are scattered areas of fibroglandular density. FINDINGS: MAMMOGRAM: Subjacent to the palpable marker in the upper far outer left breast, there is a partially visualized asymmetry measuring approximately 5.0 cm (XCCL image 40/124), with an associated benign dystrophic calcification that  measures up to 5 mm. This calcification is mammographically stable since at least December 2024. Interval density with architectural distortion in the upper inner left breast middle depth, consistent with post lumpectomy changes. There is also interval density/distortion  in the left axilla, consistent with postsurgical changes. There is diffuse left breast skin thickening, consistent with post radiation changes. No new suspicious mass, calcification, or other findings are identified in the left breast. ULTRASOUND: On physical exam, there is a superficial mobile pea-sized mass felt in the upper left breast, corresponding with the patient's area of palpable concern. Targeted left breast ultrasound was performed by the sonographer and physician in the area of palpable concern. At 1 o'clock 19 cm from nipple, there is a dystrophic calcification measuring up to 6 mm. This corresponds with the dystrophic calcifications seen on mammogram. There is surrounding echogenic fat. Overall, findings are consistent with benign fat necrosis. IMPRESSION: 1. The area of palpable concern in the LEFT breast corresponds with a benign dystrophic calcification. 2. Interval post treatment changes in the left breast and axilla. 3. No mammographic evidence of malignancy in the left breast. RECOMMENDATION: BILATERAL diagnostic mammogram in December 2025, to continue with post lumpectomy surveillance protocol. I have discussed the findings and recommendations with the patient. If applicable, a reminder letter will be sent to the patient regarding the next appointment. BI-RADS CATEGORY  2: Benign. Electronically Signed   By: Sande Cromer M.D.   On: 10/15/2023 16:20     Assessment and plan- Patient is a 53 y.o. female with pathological prognostic stage Ia invasive mammary carcinoma of the left breast pT2 N1a M0 ER/PR positive HER2 negative.  She is status post lumpectomy and adjuvant radiation therapy.  She is presently on letrozole  and  this is a routine follow-up visit  Patient will continue taking letrozole  for at least 5 if not 10 years.  I have encouraged her to take calcium 1200 mg along with vitamin D 800 international units prophylactically.  I will be getting a bone density scan sometime in the next 1 to 2 months.    Given the patient had lymph node positive breast cancer she would qualify for adjuvant CDK inhibitors and both Verzenio and Kisqali would be appropriate choices for her.In the monarchE trial, over [id number] patients (just under one-half of whom were premenopausal women) with hormone receptor-positive, HER2-negative, high-risk early breast cancer who had completed surgery were randomly assigned to abemaciclib with endocrine therapy or endocrine therapy alone. At a median of 19 months' follow-up, in the premenopausal subset, adjuvant abemaciclib plus endocrine therapy improved both invasive DFS (HR 0.63, 95% CI 0.44-0.92) and distant relapse-free survival (HR 0.71, 95% CI 0.56-0.92) relative to adjuvant endocrine therapy alone [23]. Results at longer follow-up of 27 months were consistent . The most frequent adverse events were diarrhea, neutropenia, and fatigue in the abemaciclib arm and arthralgia, hot flush, and fatigue in the control arm.  Discussed risks and benefits of Verzenio including all but not limited to nausea vomiting abdominal pain diarrhea low blood counts and risk of infections.  Treatment will be given with a curative intent.  Given patient's concern for side effects I will plan to give her a reduced dose of 100 mg twice daily to begin with and if she is tolerating that well without any significant GI side effects or cytopenias we can consider moving it up to 150 mg twice daily.  CBC with differential every 2 weeks and I will see her back in 2 months with CBC with differential and CMP.  She is likely to get her drug in about 1 week to 10 days time   Visit Diagnosis 1. High risk medication use   2. Malignant  neoplasm of upper-outer quadrant of  left breast in female, estrogen receptor positive (HCC)   3. Use of letrozole  (Femara )      Dr. Seretha Dance, MD, MPH [facility] [phone] 11/10/2023 1:01 PM

## 2023-11-10 NOTE — Progress Notes (Signed)
 Clinical Pharmacist Practitioner Clinic [facility]  Telephone:([phone] [phone]  Patient Care Team: Laurence Pons, NP as PCP - General (Nurse Practitioner) Waverly Hageman, RN as Oncology Nurse Navigator Avonne Boettcher, MD as Consulting Physician (Oncology) Glenis Langdon, MD as Consulting Physician (Radiation Oncology)   Name of the patient: Audrey Ball  [id number]  1971/01/19   Date of visit: 11/10/23  HPI: Patient is a 53 y.o. female with ER/PR+ HER2- early stage breast cancer. Patient will start adjuvant treatment with abemaciclib. She has already started letrozole .   Reason for Consult: Abemaciclib oral chemotherapy education.   PAST MEDICAL HISTORY: Past Medical History:  Diagnosis Date   Breast cancer [facility])    Hypertension     HEMATOLOGY/ONCOLOGY HISTORY:  Oncology History  Malignant neoplasm of upper-outer quadrant of left breast in female, estrogen receptor positive (HCC)  06/07/2023 Initial Diagnosis   Malignant neoplasm of upper-outer quadrant of left breast in female, estrogen receptor positive (HCC)   06/07/2023 Cancer Staging   Staging form: Breast, AJCC 8th Edition - Clinical stage from 06/07/2023: Stage IB (cT1c, cN1, cM0, G2, ER+, PR+, HER2-) - Signed by Avonne Boettcher, MD on 06/07/2023 Histologic grading system: 3 grade system    Genetic Testing   No pathogenic variants identified on the [facility] CancerNext+RNA panel. VUS in APC called [id number] identified. The report date is 06/22/2023.  The [facility] CancerNext+RNAinsight Panel includes sequencing, rearrangement analysis, and RNA analysis for the following 39 genes: APC, ATM, BAP1, BARD1, BMPR1A, BRCA1, BRCA2, BRIP1, CDH1, CDKN2A, CHEK2, FH, FLCN, MET, MLH1, MSH2, MSH6, MUTYH, NF1, NTHL1, PALB2, PMS2, PTEN, RAD51C, RAD51D, SMAD4, STK11, TP53, TSC1, TSC2, and VHL (sequencing and deletion/duplication); AXIN2, HOXB13, MBD4, MSH3, POLD1 and POLE (sequencing only); EPCAM and GREM1  (deletion/duplication only).   07/27/2023 Cancer Staging   Staging form: Breast, AJCC 8th Edition - Pathologic stage from 07/27/2023: Stage IB (pT2, pN1a(sn), cM0, G2, ER+, PR+, HER2-, Oncotype DX score: 12) - Signed by Avonne Boettcher, MD on 07/30/2023 Stage prefix: Initial diagnosis Method of lymph node assessment: Sentinel lymph node biopsy Multigene prognostic tests performed: Oncotype DX Recurrence score range: Greater than or equal to 11 Histologic grading system: 3 grade system     ALLERGIES:  is allergic to bactrim  [sulfamethoxazole -trimethoprim ], sulfa  antibiotics, penicillins, and propoxyphene.  MEDICATIONS:  Current Outpatient Medications  Medication Sig Dispense Refill   abemaciclib (VERZENIO) 100 MG tablet Take 1 tablet (100 mg total) by mouth 2 (two) times daily. 56 tablet 0   acetaminophen  (TYLENOL ) 650 MG CR tablet Take 650 mg by mouth at bedtime.     [START ON 12/21/2023] amphetamine -dextroamphetamine  (ADDERALL) 20 MG tablet TAKE 1 TABLET BY MOUTH TWICE A DAY AS NEEDED FOR FOCUS 60 tablet 0   [START ON 11/23/2023] amphetamine -dextroamphetamine  (ADDERALL) 20 MG tablet TAKE 1 TABLET BY MOUTH TWICE A DAY AS NEEDED FOR FOCUS 60 tablet 0   amphetamine -dextroamphetamine  (ADDERALL) 20 MG tablet TAKE 1 TABLET BY MOUTH TWICE A DAY AS NEEDED FOR FOCUS 60 tablet 0   betamethasone  valerate ointment (VALISONE ) 0.1 % Apply 1 Application topically daily as needed (rash). To affected area until resolved. 45 g 5   Cholecalciferol (VITAMIN D) 50 MCG (2000 UT) tablet Take 2,000 Units by mouth daily.     hydrochlorothiazide  (HYDRODIURIL ) 25 MG tablet Take 1 tablet (25 mg total) by mouth daily. 90 tablet 1   letrozole  (FEMARA ) 2.5 MG tablet Take 1 tablet (2.5 mg total) by mouth daily. 30 tablet 3   meloxicam  (MOBIC )  15 MG tablet TAKE 1 TABLET (15 MG TOTAL) BY MOUTH DAILY. 90 tablet 1   traMADol  (ULTRAM ) 50 MG tablet Take 1 tablet (50 mg total) by mouth at bedtime as needed for moderate pain (pain score  4-6) or severe pain (pain score 7-10). 30 tablet 2   No current facility-administered medications for this visit.    VITAL SIGNS: There were no vitals taken for this visit. There were no vitals filed for this visit.  Estimated body mass index is 34.08 kg/m as calculated from the following:   Height as of 10/26/23: 5\' 7"  (1.702 m).   Weight as of an earlier encounter on 11/10/23: 98.7 kg (217 lb 9.6 oz).  LABS: CBC:    Component Value Date/Time   WBC 4.5 11/10/2023 1108   WBC 4.7 09/07/2023 1413   HGB 14.1 11/10/2023 1108   HGB 14.2 07/07/2022 0939   HCT 42.5 11/10/2023 1108   HCT 42.6 07/07/2022 0939   PLT 238 11/10/2023 1108   PLT 271 07/07/2022 0939   MCV 83.8 11/10/2023 1108   MCV 84 07/07/2022 0939   MCV 86 11/01/2012 1341   NEUTROABS 2.9 11/10/2023 1108   NEUTROABS 2.8 07/07/2022 0939   NEUTROABS 2.4 11/01/2012 1341   LYMPHSABS 1.0 11/10/2023 1108   LYMPHSABS 2.0 07/07/2022 0939   LYMPHSABS 2.2 11/01/2012 1341   MONOABS 0.5 11/10/2023 1108   MONOABS 0.5 11/01/2012 1341   EOSABS 0.1 11/10/2023 1108   EOSABS 0.2 07/07/2022 0939   EOSABS 0.2 11/01/2012 1341   BASOSABS 0.1 11/10/2023 1108   BASOSABS 0.0 07/07/2022 0939   BASOSABS 0.1 11/01/2012 1341   Comprehensive Metabolic Panel:    Component Value Date/Time   NA 137 11/10/2023 1108   NA 143 07/07/2022 0939   NA 142 11/01/2012 1341   K 3.2 (L) 11/10/2023 1108   K 3.5 11/01/2012 1341   CL 98 11/10/2023 1108   CL 108 (H) 11/01/2012 1341   CO2 30 11/10/2023 1108   CO2 29 11/01/2012 1341   BUN 12 11/10/2023 1108   BUN 13 07/07/2022 0939   BUN 11 11/01/2012 1341   CREATININE 0.60 11/10/2023 1108   CREATININE 0.79 12/15/2012 0710   GLUCOSE 114 (H) 11/10/2023 1108   GLUCOSE 90 11/01/2012 1341   CALCIUM 9.3 11/10/2023 1108   CALCIUM 8.6 11/01/2012 1341   AST 25 11/10/2023 1108   ALT 31 11/10/2023 1108   ALT 18 11/01/2012 1341   ALKPHOS 98 11/10/2023 1108   ALKPHOS 47 (L) 11/01/2012 1341   BILITOT 0.5  11/10/2023 1108   PROT 8.3 (H) 11/10/2023 1108   PROT 7.4 07/07/2022 0939   PROT 6.7 11/01/2012 1341   ALBUMIN 4.0 11/10/2023 1108   ALBUMIN 4.6 07/07/2022 0939   ALBUMIN 3.5 11/01/2012 1341     Present during today's visit: patient and her cousin Audrey Ball  Start plan: Patient will start abemeciclib once she has medication in hand  Patient Education I spoke with patient for overview of new oral chemotherapy medication: abemaciclib   Administration: Counseled patient on administration, dosing, side effects, monitoring, drug-food interactions, safe handling, storage, and disposal. Patient will take 1 tablet (100mg ) by mouth twice daily.  Side Effects: Side effects include but not limited to: diarrhea, nausea, fatigue, decreased wbc/hgb/plt.  Nausea: reviewed the use of ondansetron  and prochlorperazine for management of nausea Diarrhea: patient knows to use loperamide as needed and call the office if they are having four or more loose stool per day  Drug-drug Interactions (DDI): No  current DDIs with abemaciclib  Adherence: After discussion with patient no patient barriers to medication adherence identified.  Reviewed with patient importance of keeping a medication schedule and plan for any missed doses.  Ms. Dattilio voiced understanding and appreciation. All questions answered. Medication handout provided.  She is unsure if she wants to proceed with the abemaciclib. 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 Reviewed that we could take a more preventative approach to management of diarrhea and nausea. Will check back in with Ms. Dizon in ~2 weeks to see if she has made a decision   Provided patient with Oral Chemotherapy Navigation Clinic phone number. Patient knows to call the office with questions or concerns. Oral Chemotherapy Navigation Clinic will continue to follow.  Patient expressed understanding and was  in agreement with this plan. She also understands that She can call clinic at any time with any questions, concerns, or complaints.   Medication Access Issues: Prescription sent to [facility]  Follow-up plan: RTC as scheduled  Thank you for allowing me to participate in the care of this patient.   Time Total: 15 mins  Visit consisted of counseling and education on dealing with issues of symptom management in the setting of serious and potentially life-threatening illness.Greater than 50%  of this time was spent counseling and coordinating care related to the above assessment and plan.  Signed by: Neetu Carrozza N. Charlisha Market, PharmD, Lorraine Roses, CPP Hematology/Oncology Clinical Pharmacist Practitioner [facility] [phone]  11/10/2023 12:25 PM

## 2023-11-10 NOTE — Progress Notes (Signed)
 Patient has questions about going forward with the medication.

## 2023-11-18 NOTE — Telephone Encounter (Signed)
 Per [facility] online portal, medication was delivered to patient on 11/14/23.

## 2023-11-20 ENCOUNTER — Encounter: Payer: Self-pay | Admitting: *Deleted

## 2023-11-24 ENCOUNTER — Other Ambulatory Visit: Payer: Self-pay | Admitting: Oncology

## 2023-11-24 ENCOUNTER — Inpatient Hospital Stay

## 2023-11-24 DIAGNOSIS — C50412 Malignant neoplasm of upper-outer quadrant of left female breast: Secondary | ICD-10-CM

## 2023-11-25 ENCOUNTER — Telehealth: Payer: Self-pay | Admitting: Pharmacist

## 2023-11-25 NOTE — Telephone Encounter (Signed)
 Oral Chemotherapy Pharmacist Encounter   Patient called me today to report that she started her abemaciclib  on 11/21/23 and has felt like her voice has been hoarse in the morning since starting. She wanted to know if this was rom the abemaciclib .  I do not think this is directly from the abemaciclib . I ask a few follow-up questions and she stated her throat feels mucousy in the morning along with her voice hoarseness. Both improve has she goes through her day.   I suggested patient start taking an antihistamine daily to see in this helps decrease the mucus in the morning.   Other than the above she states she is doing well. She feels a bit fatigued after taking her morning abemaciclib  but she has been able to manage it.  No reported issue with diarrhea yet.    Audrey Ball, PharmD, BCOP, CPP Hematology/Oncology Clinical Pharmacist [facility] Oral Chemotherapy Navigation Clinic [phone]  11/25/2023 3:45 PM

## 2023-12-08 ENCOUNTER — Inpatient Hospital Stay: Attending: Oncology

## 2023-12-08 ENCOUNTER — Inpatient Hospital Stay

## 2023-12-08 DIAGNOSIS — C50412 Malignant neoplasm of upper-outer quadrant of left female breast: Secondary | ICD-10-CM | POA: Diagnosis not present

## 2023-12-08 DIAGNOSIS — Z170 Estrogen receptor positive status [ER+]: Secondary | ICD-10-CM

## 2023-12-08 LAB — CBC WITH DIFFERENTIAL (CANCER CENTER ONLY)
Abs Immature Granulocytes: 0.01 10*3/uL (ref 0.00–0.07)
Basophils Absolute: 0 10*3/uL (ref 0.0–0.1)
Basophils Relative: 1 %
Eosinophils Absolute: 0.1 10*3/uL (ref 0.0–0.5)
Eosinophils Relative: 4 %
HCT: 40.4 % (ref 36.0–46.0)
Hemoglobin: 13.2 g/dL (ref 12.0–15.0)
Immature Granulocytes: 0 %
Lymphocytes Relative: 37 %
Lymphs Abs: 1.1 10*3/uL (ref 0.7–4.0)
MCH: 28.1 pg (ref 26.0–34.0)
MCHC: 32.7 g/dL (ref 30.0–36.0)
MCV: 86 fL (ref 80.0–100.0)
Monocytes Absolute: 0.3 10*3/uL (ref 0.1–1.0)
Monocytes Relative: 8 %
Neutro Abs: 1.6 10*3/uL — ABNORMAL LOW (ref 1.7–7.7)
Neutrophils Relative %: 50 %
Platelet Count: 223 10*3/uL (ref 150–400)
RBC: 4.7 MIL/uL (ref 3.87–5.11)
RDW: 13.6 % (ref 11.5–15.5)
WBC Count: 3.1 10*3/uL — ABNORMAL LOW (ref 4.0–10.5)
nRBC: 0 % (ref 0.0–0.2)

## 2023-12-22 ENCOUNTER — Inpatient Hospital Stay: Attending: Oncology

## 2023-12-22 DIAGNOSIS — C50412 Malignant neoplasm of upper-outer quadrant of left female breast: Secondary | ICD-10-CM | POA: Insufficient documentation

## 2023-12-25 ENCOUNTER — Other Ambulatory Visit: Payer: Self-pay | Admitting: Oncology

## 2023-12-25 DIAGNOSIS — Z170 Estrogen receptor positive status [ER+]: Secondary | ICD-10-CM

## 2024-01-04 ENCOUNTER — Inpatient Hospital Stay

## 2024-01-04 DIAGNOSIS — Z170 Estrogen receptor positive status [ER+]: Secondary | ICD-10-CM

## 2024-01-04 DIAGNOSIS — C50412 Malignant neoplasm of upper-outer quadrant of left female breast: Secondary | ICD-10-CM | POA: Diagnosis not present

## 2024-01-04 LAB — CBC WITH DIFFERENTIAL (CANCER CENTER ONLY)
Abs Immature Granulocytes: 0.01 K/uL (ref 0.00–0.07)
Basophils Absolute: 0.1 K/uL (ref 0.0–0.1)
Basophils Relative: 1 %
Eosinophils Absolute: 0.1 K/uL (ref 0.0–0.5)
Eosinophils Relative: 1 %
HCT: 38.2 % (ref 36.0–46.0)
Hemoglobin: 12.7 g/dL (ref 12.0–15.0)
Immature Granulocytes: 0 %
Lymphocytes Relative: 33 %
Lymphs Abs: 1.3 K/uL (ref 0.7–4.0)
MCH: 28.7 pg (ref 26.0–34.0)
MCHC: 33.2 g/dL (ref 30.0–36.0)
MCV: 86.4 fL (ref 80.0–100.0)
Monocytes Absolute: 0.4 K/uL (ref 0.1–1.0)
Monocytes Relative: 10 %
Neutro Abs: 2.1 K/uL (ref 1.7–7.7)
Neutrophils Relative %: 55 %
Platelet Count: 248 K/uL (ref 150–400)
RBC: 4.42 MIL/uL (ref 3.87–5.11)
RDW: 14.9 % (ref 11.5–15.5)
WBC Count: 3.8 K/uL — ABNORMAL LOW (ref 4.0–10.5)
nRBC: 0 % (ref 0.0–0.2)

## 2024-01-05 ENCOUNTER — Inpatient Hospital Stay

## 2024-01-20 ENCOUNTER — Ambulatory Visit (INDEPENDENT_AMBULATORY_CARE_PROVIDER_SITE_OTHER): Admitting: Nurse Practitioner

## 2024-01-20 ENCOUNTER — Encounter: Payer: Self-pay | Admitting: Nurse Practitioner

## 2024-01-20 VITALS — BP 138/88 | HR 93 | Temp 96.3°F | Resp 16 | Ht 67.0 in | Wt 214.8 lb

## 2024-01-20 DIAGNOSIS — G8929 Other chronic pain: Secondary | ICD-10-CM | POA: Diagnosis not present

## 2024-01-20 DIAGNOSIS — M545 Low back pain, unspecified: Secondary | ICD-10-CM

## 2024-01-20 DIAGNOSIS — F988 Other specified behavioral and emotional disorders with onset usually occurring in childhood and adolescence: Secondary | ICD-10-CM | POA: Diagnosis not present

## 2024-01-20 DIAGNOSIS — I10 Essential (primary) hypertension: Secondary | ICD-10-CM

## 2024-01-20 MED ORDER — AMPHETAMINE-DEXTROAMPHETAMINE 20 MG PO TABS: ORAL_TABLET | ORAL | 0 refills | Status: AC

## 2024-01-20 MED ORDER — HYDROCHLOROTHIAZIDE 25 MG PO TABS: 25.0000 mg | ORAL_TABLET | Freq: Every day | ORAL | 1 refills | Status: AC

## 2024-01-20 MED ORDER — TRAMADOL HCL 50 MG PO TABS: 50.0000 mg | ORAL_TABLET | Freq: Every evening | ORAL | 2 refills | Status: AC | PRN

## 2024-01-20 NOTE — Progress Notes (Signed)
 [facility] [address] [place], [place] [address]  Internal MEDICINE  Office Visit Note  Patient Name: Audrey Ball  [id number]  [id number]  Date of Service: 01/20/2024  Chief Complaint  Patient presents with   Hypertension   Follow-up    HPI Audrey Ball presents for a follow-up visit for      Current Medication: Outpatient Encounter Medications as of 01/20/2024  Medication Sig   acetaminophen  (TYLENOL ) 650 MG CR tablet Take 650 mg by mouth at bedtime.   [START ON 03/16/2024] amphetamine -dextroamphetamine  (ADDERALL) 20 MG tablet TAKE 1 TABLET BY MOUTH TWICE A DAY AS NEEDED FOR FOCUS   [START ON 02/17/2024] amphetamine -dextroamphetamine  (ADDERALL) 20 MG tablet TAKE 1 TABLET BY MOUTH TWICE A DAY AS NEEDED FOR FOCUS   amphetamine -dextroamphetamine  (ADDERALL) 20 MG tablet TAKE 1 TABLET BY MOUTH TWICE A DAY AS NEEDED FOR FOCUS   betamethasone  valerate ointment (VALISONE ) 0.1 % Apply 1 Application topically daily as needed (rash). To affected area until resolved.   Cholecalciferol (VITAMIN D) 50 MCG (2000 UT) tablet Take 2,000 Units by mouth daily.   hydrochlorothiazide  (HYDRODIURIL ) 25 MG tablet Take 1 tablet (25 mg total) by mouth daily.   letrozole  (FEMARA ) 2.5 MG tablet Take 1 tablet (2.5 mg total) by mouth daily.   meloxicam  (MOBIC ) 15 MG tablet TAKE 1 TABLET (15 MG TOTAL) BY MOUTH DAILY.   ondansetron  (ZOFRAN ) 8 MG tablet Take 1 tablet (8 mg total) by mouth every 8 (eight) hours as needed for nausea or vomiting.   prochlorperazine  (COMPAZINE ) 10 MG tablet Take 1 tablet (10 mg total) by mouth every 6 (six) hours as needed for vomiting or nausea.   traMADol  (ULTRAM ) 50 MG tablet Take 1 tablet (50 mg total) by mouth at bedtime as needed for moderate pain (pain score 4-6) or severe pain (pain score 7-10).   VERZENIO  100 MG tablet TAKE 1 TABLET TWICE A DAY   [DISCONTINUED] amphetamine -dextroamphetamine  (ADDERALL) 20 MG tablet TAKE 1 TABLET BY MOUTH TWICE A DAY AS NEEDED  FOR FOCUS   [DISCONTINUED] amphetamine -dextroamphetamine  (ADDERALL) 20 MG tablet TAKE 1 TABLET BY MOUTH TWICE A DAY AS NEEDED FOR FOCUS   [DISCONTINUED] amphetamine -dextroamphetamine  (ADDERALL) 20 MG tablet TAKE 1 TABLET BY MOUTH TWICE A DAY AS NEEDED FOR FOCUS   [DISCONTINUED] hydrochlorothiazide  (HYDRODIURIL ) 25 MG tablet Take 1 tablet (25 mg total) by mouth daily.   [DISCONTINUED] traMADol  (ULTRAM ) 50 MG tablet Take 1 tablet (50 mg total) by mouth at bedtime as needed for moderate pain (pain score 4-6) or severe pain (pain score 7-10).   No facility-administered encounter medications on file as of 01/20/2024.    Surgical History: Past Surgical History:  Procedure Laterality Date   BACK SURGERY  2017   BREAST BIOPSY Left 05/27/2023   u/s bx,mass 10:00, RIBBON clip-path pending   BREAST BIOPSY Left 05/27/2023   u/s bx, axilla/BUTTERFLY clippath pending   BREAST BIOPSY Left 05/27/2023   US  LT BREAST BX W LOC DEV 1ST LESION IMG BX SPEC US  GUIDE 05/27/2023 [facility]   BREAST BIOPSY Left 06/15/2023   US  LT RADIO FREQUENCY TAG LOC US  GUIDE 06/15/2023 [facility]   BREAST BIOPSY Left 06/15/2023   US  LT RADIO FREQUENCY TAG EA ADD LESION LOC US  GUIDE 06/15/2023 [facility]   COLONOSCOPY WITH PROPOFOL  N/A 08/16/2021   Procedure: COLONOSCOPY WITH PROPOFOL ;  Surgeon: Unk Corinn Skiff, MD;  Location: [facility];  Service: Gastroenterology;  Laterality: N/A;   GALLBLADDER SURGERY  1999   PART MASTECTOMY,RADIO FREQUENCY LOCALIZER,AXILLARY SENTINEL NODE  BIOPSY Left 06/22/2023   Procedure: PART MASTECTOMY,RADIO FREQUENCY LOCALIZER,AXILLARY SENTINEL NODE BIOPSY;  Surgeon: Rodolph Romano, MD;  Location: [facility];  Service: General;  Laterality: Left;   TOTAL VAGINAL HYSTERECTOMY  2016   [facility]ilateral [facility] at [facility] for abnormal uterine bleeding    Medical History: Past Medical History:  Diagnosis Date   Breast cancer (HCC)    Hypertension     Family  History: Family History  Problem Relation Age of Onset   Leukemia Mother 35   Hypertension Mother    Diabetes Mother    Lung cancer Father 64   Throat cancer Brother        Currently in remission.   Lung cancer Maternal Grandmother        d.>50   Breast cancer Paternal Grandmother        dx 19s    Social History   Socioeconomic History   Marital status: Single    Spouse name: Not on file   Number of children: Not on file   Years of education: Not on file   Highest education level: Not on file  Occupational History   Not on file  Tobacco Use   Smoking status: Never   Smokeless tobacco: Never  Vaping Use   Vaping status: Never Used  Substance and Sexual Activity   Alcohol use: No   Drug use: No   Sexual activity: Yes    Birth control/protection: Surgical  Other Topics Concern   Not on file  Social History Narrative   Not on file   Social Drivers of Health   Financial Resource Strain: Low Risk  (06/04/2023)   Received from [facility]   Overall Financial Resource Strain (CARDIA)    Difficulty of Paying Living Expenses: Not hard at all  Food Insecurity: No Food Insecurity (06/04/2023)   Received from [facility]   Hunger Vital Sign    Within the past 12 months, you worried that your food would run out before you got the money to buy more.: Never true    Within the past 12 months, the food you bought just didn't last and you didn't have money to get more.: Never true  Transportation Needs: No Transportation Needs (06/04/2023)   Received from [facility] - Transportation    In the past 12 months, has lack of transportation kept you from medical appointments or from getting medications?: No    Lack of Transportation (Non-Medical): No  Physical Activity: Not on file  Stress: Not on file  Social Connections: Not on file  Intimate Partner Violence: Not At Risk (06/03/2023)   Humiliation, Afraid, Rape,  and Kick questionnaire    Fear of Current or Ex-Partner: No    Emotionally Abused: No    Physically Abused: No    Sexually Abused: No      Review of Systems  Vital Signs: BP (!) 157/99   Pulse 93   Temp (!) 96.3 F (35.7 C)   Resp 16   Ht 5' 7 (1.702 m)   Wt 214 lb 12.8 oz (97.4 kg)   SpO2 96%   BMI 33.64 kg/m    Physical Exam     Assessment/Plan:   General Counseling: Audrey Ball verbalizes understanding of the findings of todays visit and agrees with plan of treatment. I have discussed any further diagnostic evaluation that may be needed or ordered today. We also reviewed her medications today. she has been encouraged to call the  office with any questions or concerns that should arise related to todays visit.    No orders of the defined types were placed in this encounter.   Meds ordered this encounter  Medications   amphetamine -dextroamphetamine  (ADDERALL) 20 MG tablet    Sig: TAKE 1 TABLET BY MOUTH TWICE A DAY AS NEEDED FOR FOCUS    Dispense:  60 tablet    Refill:  0    Fill for September 24   amphetamine -dextroamphetamine  (ADDERALL) 20 MG tablet    Sig: TAKE 1 TABLET BY MOUTH TWICE A DAY AS NEEDED FOR FOCUS    Dispense:  60 tablet    Refill:  0    Fill for August 27   amphetamine -dextroamphetamine  (ADDERALL) 20 MG tablet    Sig: TAKE 1 TABLET BY MOUTH TWICE A DAY AS NEEDED FOR FOCUS    Dispense:  60 tablet    Refill:  0    Fill for July 30   hydrochlorothiazide  (HYDRODIURIL ) 25 MG tablet    Sig: Take 1 tablet (25 mg total) by mouth daily.    Dispense:  90 tablet    Refill:  1   traMADol  (ULTRAM ) 50 MG tablet    Sig: Take 1 tablet (50 mg total) by mouth at bedtime as needed for moderate pain (pain score 4-6) or severe pain (pain score 7-10).    Dispense:  30 tablet    Refill:  2    For refill    Return in about 3 months (around 04/14/2024) for F/U, Todd Argabright PCP , ADHD med check and tramadol  refills. .   Total time spent:*** Minutes Time spent  includes review of chart, medications, test results, and follow up plan with the patient.   [place] Controlled Substance Database was reviewed by me.  This patient was seen by Mardy Maxin, FNP-C in collaboration with Dr. Sigrid Bathe as a part of collaborative care agreement.   Cecillia Menees R. Maxin, MSN, FNP-C Internal medicine

## 2024-01-21 ENCOUNTER — Encounter: Payer: Self-pay | Admitting: Nurse Practitioner

## 2024-01-25 ENCOUNTER — Other Ambulatory Visit: Payer: Self-pay | Admitting: Oncology

## 2024-01-25 DIAGNOSIS — Z170 Estrogen receptor positive status [ER+]: Secondary | ICD-10-CM

## 2024-01-26 ENCOUNTER — Telehealth: Payer: Self-pay | Admitting: Oncology

## 2024-01-26 ENCOUNTER — Inpatient Hospital Stay: Admitting: Oncology

## 2024-01-26 ENCOUNTER — Other Ambulatory Visit

## 2024-01-26 NOTE — Telephone Encounter (Signed)
 Called pt to reschedule appt from today. She is now scheduled for 9/8 at 945. Pt said this day and time would work and she would be here

## 2024-01-29 ENCOUNTER — Other Ambulatory Visit: Payer: Self-pay | Admitting: Oncology

## 2024-02-16 ENCOUNTER — Telehealth: Payer: Self-pay

## 2024-02-16 DIAGNOSIS — B001 Herpesviral vesicular dermatitis: Secondary | ICD-10-CM

## 2024-02-16 MED ORDER — VALACYCLOVIR HCL 1 G PO TABS: 1000.0000 mg | ORAL_TABLET | Freq: Two times a day (BID) | ORAL | 3 refills | Status: AC

## 2024-02-16 NOTE — Telephone Encounter (Signed)
Pt notified that we sent med

## 2024-02-19 ENCOUNTER — Other Ambulatory Visit: Payer: Self-pay | Admitting: Oncology

## 2024-02-19 DIAGNOSIS — C50412 Malignant neoplasm of upper-outer quadrant of left female breast: Secondary | ICD-10-CM

## 2024-02-29 ENCOUNTER — Encounter: Payer: Self-pay | Admitting: Oncology

## 2024-02-29 ENCOUNTER — Inpatient Hospital Stay: Attending: Oncology

## 2024-02-29 ENCOUNTER — Inpatient Hospital Stay: Admitting: Oncology

## 2024-02-29 VITALS — BP 160/78 | HR 63 | Temp 97.4°F | Resp 19 | Ht 67.0 in | Wt 215.8 lb

## 2024-02-29 DIAGNOSIS — Z79899 Other long term (current) drug therapy: Secondary | ICD-10-CM | POA: Diagnosis not present

## 2024-02-29 DIAGNOSIS — Z170 Estrogen receptor positive status [ER+]: Secondary | ICD-10-CM | POA: Diagnosis not present

## 2024-02-29 DIAGNOSIS — Z17411 Hormone receptor positive with human epidermal growth factor receptor 2 negative status: Secondary | ICD-10-CM | POA: Insufficient documentation

## 2024-02-29 DIAGNOSIS — C50412 Malignant neoplasm of upper-outer quadrant of left female breast: Secondary | ICD-10-CM

## 2024-02-29 DIAGNOSIS — E876 Hypokalemia: Secondary | ICD-10-CM | POA: Diagnosis not present

## 2024-02-29 DIAGNOSIS — Z79811 Long term (current) use of aromatase inhibitors: Secondary | ICD-10-CM | POA: Insufficient documentation

## 2024-02-29 LAB — CMP (CANCER CENTER ONLY)
ALT: 15 U/L (ref 0–44)
AST: 19 U/L (ref 15–41)
Albumin: 4.1 g/dL (ref 3.5–5.0)
Alkaline Phosphatase: 69 U/L (ref 38–126)
Anion gap: 11 (ref 5–15)
BUN: 11 mg/dL (ref 6–20)
CO2: 29 mmol/L (ref 22–32)
Calcium: 9.6 mg/dL (ref 8.9–10.3)
Chloride: 97 mmol/L — ABNORMAL LOW (ref 98–111)
Creatinine: 0.88 mg/dL (ref 0.44–1.00)
GFR, Estimated: >60 mL/min (ref >60)
Glucose, Bld: 99 mg/dL (ref 70–99)
Potassium: 3 mmol/L — ABNORMAL LOW (ref 3.5–5.1)
Sodium: 137 mmol/L (ref 135–145)
Total Bilirubin: 0.7 mg/dL (ref 0.0–1.2)
Total Protein: 7.8 g/dL (ref 6.5–8.1)

## 2024-02-29 LAB — CBC WITH DIFFERENTIAL (CANCER CENTER ONLY)
Abs Immature Granulocytes: 0.01 K/uL (ref 0.00–0.07)
Basophils Absolute: 0.1 K/uL (ref 0.0–0.1)
Basophils Relative: 1 %
Eosinophils Absolute: 0.1 K/uL (ref 0.0–0.5)
Eosinophils Relative: 3 %
HCT: 37.2 % (ref 36.0–46.0)
Hemoglobin: 12.5 g/dL (ref 12.0–15.0)
Immature Granulocytes: 0 %
Lymphocytes Relative: 24 %
Lymphs Abs: 1.1 K/uL (ref 0.7–4.0)
MCH: 30.3 pg (ref 26.0–34.0)
MCHC: 33.6 g/dL (ref 30.0–36.0)
MCV: 90.3 fL (ref 80.0–100.0)
Monocytes Absolute: 0.4 K/uL (ref 0.1–1.0)
Monocytes Relative: 9 %
Neutro Abs: 2.8 K/uL (ref 1.7–7.7)
Neutrophils Relative %: 63 %
Platelet Count: 262 K/uL (ref 150–400)
RBC: 4.12 MIL/uL (ref 3.87–5.11)
RDW: 15 % (ref 11.5–15.5)
WBC Count: 4.4 K/uL (ref 4.0–10.5)
nRBC: 0 % (ref 0.0–0.2)

## 2024-02-29 MED ORDER — EXEMESTANE 25 MG PO TABS: 25.0000 mg | ORAL_TABLET | Freq: Every day | ORAL | 2 refills | Status: DC

## 2024-02-29 MED ORDER — POTASSIUM CHLORIDE CRYS ER 20 MEQ PO TBCR: 20.0000 meq | EXTENDED_RELEASE_TABLET | Freq: Once | ORAL | 0 refills | Status: AC

## 2024-02-29 NOTE — Progress Notes (Signed)
 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  This was followed by diagnostic bilateral mammogram which showed a 12 mm mass at the site of palpable concern concerning for malignancy along with nonenlarged left axillary lymph node.  Ultrasound also confirmed the same findings.  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  Tumor was ER 100% positive, PR 70% positive and HER2 negative +1.   Final pathology showed tumor size of 3.6 cm with negative margins grade 2 invasive ductal carcinoma with lobular features.  3 out of 5 sentinel lymph nodes positive for malignancy.  2 of them had macrometastases and 1 with micrometastases.  Extranodal extension present.  Oncotype testing was sent which came back at intermediate risk with a recurrence score of 12.  She did not require any adjuvant chemotherapy and was started on letrozole  in March 2025.  She completed adjuvant radiation therapy.    Interval history-patient reports having significant joint pain with letrozole .  She gets on and off nausea and diarrhea with  Verzenio   ECOG PS- 1 Pain scale- 3   Review of systems- Review of Systems  Constitutional:  Positive for malaise/fatigue. Negative for chills, fever and weight loss.  HENT:  Negative for congestion, ear discharge and nosebleeds.   Eyes:  Negative for blurred vision.  Respiratory:  Negative for cough, hemoptysis, sputum production, shortness of breath and wheezing.   Cardiovascular:  Negative for chest pain, palpitations, orthopnea and claudication.  Gastrointestinal:  Positive for diarrhea and nausea. Negative for abdominal pain, blood in stool, constipation, heartburn, melena and vomiting.  Genitourinary:  Negative for dysuria, flank pain, frequency, hematuria and urgency.  Musculoskeletal:  Positive for joint pain. Negative for back pain and myalgias.  Skin:  Negative for rash.  Neurological:  Negative for dizziness, tingling, focal weakness, seizures, weakness and headaches.  Endo/Heme/Allergies:  Does not bruise/bleed easily.  Psychiatric/Behavioral:  Negative for depression and suicidal ideas. The patient does not have insomnia.       Allergies  Allergen Reactions   Bactrim  [Sulfamethoxazole -Trimethoprim ] Hives and Swelling    Swelling in lips and leg   Sulfa  Antibiotics Hives   Penicillins Rash   Propoxyphene Other (See Comments)    headache      Past Medical History:  Diagnosis Date   Breast cancer (HCC)    Hypertension      Past Surgical History:  Procedure Laterality Date   BACK SURGERY  2017   BREAST BIOPSY Left 05/27/2023   u/s bx,mass 10:00, RIBBON clip-path pending   BREAST BIOPSY Left 05/27/2023   u/s bx, axilla/BUTTERFLY clippath pending   BREAST BIOPSY Left 05/27/2023   US  LT BREAST BX W LOC DEV 1ST LESION IMG BX SPEC US  GUIDE  05/27/2023 [facility]   BREAST BIOPSY Left 06/15/2023   US  LT RADIO FREQUENCY TAG LOC US  GUIDE 06/15/2023 [facility]   BREAST BIOPSY Left 06/15/2023   US  LT RADIO FREQUENCY TAG EA ADD LESION LOC US  GUIDE 06/15/2023  [facility]   COLONOSCOPY WITH PROPOFOL  N/A 08/16/2021   Procedure: COLONOSCOPY WITH PROPOFOL ;  Surgeon: Unk Corinn Skiff, MD;  Location: [facility];  Service: Gastroenterology;  Laterality: N/A;   GALLBLADDER SURGERY  1999   PART MASTECTOMY,RADIO FREQUENCY LOCALIZER,AXILLARY SENTINEL NODE BIOPSY Left 06/22/2023   Procedure: PART MASTECTOMY,RADIO FREQUENCY LOCALIZER,AXILLARY SENTINEL NODE BIOPSY;  Surgeon: Rodolph Romano, MD;  Location: [facility];  Service: General;  Laterality: Left;   TOTAL VAGINAL HYSTERECTOMY  2016   bilateral salpingectomy at [facility] for abnormal uterine bleeding    Social History   Socioeconomic History   Marital status: Single    Spouse name: Not on file   Number of children: Not on file   Years of education: Not on file   Highest education level: Not on file  Occupational History   Not on file  Tobacco Use   Smoking status: Never   Smokeless tobacco: Never  Vaping Use   Vaping status: Never Used  Substance and Sexual Activity   Alcohol use: No   Drug use: No   Sexual activity: Yes    Birth control/protection: Surgical  Other Topics Concern   Not on file  Social History Narrative   Not on file   Social Drivers of Health   Financial Resource Strain: Low Risk  (06/04/2023)   Received from [facility]   Overall Financial Resource Strain (CARDIA)    Difficulty of Paying Living Expenses: Not hard at all  Food Insecurity: No Food Insecurity (06/04/2023)   Received from [facility]   Hunger Vital Sign    Within the past 12 months, you worried that your food would run out before you got the money to buy more.: Never true    Within the past 12 months, the food you bought just didn't last and you didn't have money to get more.: Never true  Transportation Needs: No Transportation Needs (06/04/2023)   Received from [facility] - Transportation    In the past 12 months, has lack  of transportation kept you from medical appointments or from getting medications?: No    Lack of Transportation (Non-Medical): No  Physical Activity: Not on file  Stress: Not on file  Social Connections: Not on file  Intimate Partner Violence: Not At Risk (06/03/2023)   Humiliation, Afraid, Rape, and Kick questionnaire    Fear of Current or Ex-Partner: No    Emotionally Abused: No    Physically Abused: No    Sexually Abused: No    Family History  Problem Relation Age of Onset   Leukemia Mother 97   Hypertension Mother    Diabetes Mother    Lung cancer Father 15   Throat cancer Brother        Currently in remission.   Lung cancer Maternal Grandmother        d.>50   Breast cancer Paternal Grandmother        dx 80s     Current Outpatient Medications:    acetaminophen  (TYLENOL ) 650 MG CR tablet, Take 650 mg by mouth at bedtime., Disp: , Rfl:    [START ON 03/16/2024] amphetamine -dextroamphetamine  (ADDERALL) 20 MG tablet, TAKE 1 TABLET BY MOUTH TWICE A DAY AS NEEDED FOR FOCUS, Disp:  60 tablet, Rfl: 0   betamethasone  valerate ointment (VALISONE ) 0.1 %, Apply 1 Application topically daily as needed (rash). To affected area until resolved., Disp: 45 g, Rfl: 5   Cholecalciferol (VITAMIN D) 50 MCG (2000 UT) tablet, Take 2,000 Units by mouth daily., Disp: , Rfl:    exemestane  (AROMASIN ) 25 MG tablet, Take 1 tablet (25 mg total) by mouth daily after breakfast., Disp: 30 tablet, Rfl: 2   hydrochlorothiazide  (HYDRODIURIL ) 25 MG tablet, Take 1 tablet (25 mg total) by mouth daily., Disp: 90 tablet, Rfl: 1   meloxicam  (MOBIC ) 15 MG tablet, TAKE 1 TABLET (15 MG TOTAL) BY MOUTH DAILY., Disp: 90 tablet, Rfl: 1   potassium chloride  SA (KLOR-CON  M) 20 MEQ tablet, Take 1 tablet (20 mEq total) by mouth once for 1 dose., Disp: 7 tablet, Rfl: 0   traMADol  (ULTRAM ) 50 MG tablet, Take 1 tablet (50 mg total) by mouth at bedtime as needed for moderate pain (pain score 4-6) or severe pain (pain score 7-10).,  Disp: 30 tablet, Rfl: 2   valACYclovir  (VALTREX ) 1000 MG tablet, Take 1 tablet (1,000 mg total) by mouth 2 (two) times daily., Disp: 60 tablet, Rfl: 3   VERZENIO  100 MG tablet, TAKE 1 TABLET TWICE A DAY, Disp: 56 tablet, Rfl: 0   amphetamine -dextroamphetamine  (ADDERALL) 20 MG tablet, TAKE 1 TABLET BY MOUTH TWICE A DAY AS NEEDED FOR FOCUS, Disp: 60 tablet, Rfl: 0   amphetamine -dextroamphetamine  (ADDERALL) 20 MG tablet, TAKE 1 TABLET BY MOUTH TWICE A DAY AS NEEDED FOR FOCUS, Disp: 60 tablet, Rfl: 0   ondansetron  (ZOFRAN ) 8 MG tablet, Take 1 tablet (8 mg total) by mouth every 8 (eight) hours as needed for nausea or vomiting. (Patient not taking: Reported on 02/29/2024), Disp: 20 tablet, Rfl: 3   prochlorperazine  (COMPAZINE ) 10 MG tablet, Take 1 tablet (10 mg total) by mouth every 6 (six) hours as needed for vomiting or nausea. (Patient not taking: Reported on 02/29/2024), Disp: 30 tablet, Rfl: 3  Physical exam:  Vitals:   02/29/24 1008  BP: (!) 160/78  Pulse: 63  Resp: 19  Temp: (!) 97.4 F (36.3 C)  TempSrc: Tympanic  SpO2: 98%  Weight: 215 lb 12.8 oz (97.9 kg)  Height: 5' 7 (1.702 m)   Physical Exam Cardiovascular:     Rate and Rhythm: Normal rate and regular rhythm.     Heart sounds: Normal heart sounds.  Pulmonary:     Effort: Pulmonary effort is normal.     Breath sounds: Normal breath sounds.  Skin:    General: Skin is warm and dry.  Neurological:     Mental Status: She is alert and oriented to person, place, and time.    Breast exam was performed in seated and lying down position. Patient is status post left lumpectomy with a well-healed surgical scar. No evidence of any palpable masses. No evidence of axillary adenopathy.  Diffuse postradiation hyperpigmentation noted over the skin of the left breast.  No evidence of any palpable masses or lumps in the right breast. No evidence of right axillary adenopathy   I have personally reviewed labs listed below:    Latest Ref Rng &  Units 02/29/2024    9:52 AM  CMP  Glucose 70 - 99 mg/dL 99   BUN 6 - 20 mg/dL 11   Creatinine 9.55 - 1.00 mg/dL 9.11   Sodium 864 - 854 mmol/L 137   Potassium 3.5 - 5.1 mmol/L 3.0   Chloride 98 - 111 mmol/L 97  CO2 22 - 32 mmol/L 29   Calcium 8.9 - 10.3 mg/dL 9.6   Total Protein 6.5 - 8.1 g/dL 7.8   Total Bilirubin 0.0 - 1.2 mg/dL 0.7   Alkaline Phos 38 - 126 U/L 69   AST 15 - 41 U/L 19   ALT 0 - 44 U/L 15       Latest Ref Rng & Units 02/29/2024    9:52 AM  CBC  WBC 4.0 - 10.5 K/uL 4.4   Hemoglobin 12.0 - 15.0 g/dL 87.4   Hematocrit 63.9 - 46.0 % 37.2   Platelets 150 - 400 K/uL 262      Assessment and plan- Patient is a 53 y.o. female with pathological prognostic stage Ia invasive mammary carcinoma of the left breast pT2 N1a M0 ER/PR positive HER2 negative.  She is status post lumpectomy and adjuvant radiation therapy.  She is presently on letrozole  plus Verzenio  and this is a routine follow-up visit  Patient has been experiencing significant arthralgias with letrozole .  Have asked her to hold off on taking letrozole  for the next couple of weeks and we will switch her to exemestane  at this time.  She is tolerating Verzenio  relatively well although she does report occasional nausea.  Diarrhea is well-controlled with medications.  She will let me know which nausea medication have worked and not work for her.  She does not remember that today.  We will manage her nausea accordingly.  Ideally patient will need to take Verzenio  for 2 years ending in May 2027  Labs in 6 weeks and 12 weeks and I will see her back in 12 weeks.  Clinically patient is doing well with no concerning signs and symptoms of recurrence based on today's exam.  She will be due for bone density ASAP.  Her mammogram from April 2025 was unremarkable  Hypokalemia: We will send her 1 week of oral potassium 20 mill equivalents daily   Visit Diagnosis 1. Malignant neoplasm of upper-outer quadrant of left breast in female,  estrogen receptor positive (HCC)   2. High risk medication use   3. Use of letrozole  (Femara )      Dr. Annah Skene, MD, MPH [facility] [phone] 02/29/2024 1:09 PM

## 2024-02-29 NOTE — Progress Notes (Signed)
 Patient not feeling well & experiencing some generalized pain that she rates a 5 on a pain scale from 1-10. She also is having trouble with sleep. She was unable to get her bone scan done, as she stated that she was never called for scheduling. Patient has a few concerns she would like insight on.

## 2024-03-08 ENCOUNTER — Ambulatory Visit (INDEPENDENT_AMBULATORY_CARE_PROVIDER_SITE_OTHER): Admitting: Nurse Practitioner

## 2024-03-08 ENCOUNTER — Encounter: Payer: Self-pay | Admitting: Nurse Practitioner

## 2024-03-08 VITALS — BP 135/88 | HR 80 | Temp 97.7°F | Resp 16 | Ht 67.0 in | Wt 213.4 lb

## 2024-03-08 DIAGNOSIS — Z113 Encounter for screening for infections with a predominantly sexual mode of transmission: Secondary | ICD-10-CM | POA: Diagnosis not present

## 2024-03-08 DIAGNOSIS — N898 Other specified noninflammatory disorders of vagina: Secondary | ICD-10-CM

## 2024-03-08 DIAGNOSIS — B3731 Acute candidiasis of vulva and vagina: Secondary | ICD-10-CM

## 2024-03-08 MED ORDER — FLUCONAZOLE 150 MG PO TABS: 150.0000 mg | ORAL_TABLET | Freq: Once | ORAL | 0 refills | Status: AC

## 2024-03-08 NOTE — Progress Notes (Signed)
 [facility] [address] [place], [place] [address]  Internal MEDICINE  Office Visit Note  Patient Name: Audrey Ball  [id number]  [id number]  Date of Service: 03/08/2024  Chief Complaint  Patient presents with   Acute Visit    Discharge yesterday morning- don't have it now     HPI Audrey Ball presents for an acute sick visit for vaginal discharge.  --has some white discharge x1 yesterday morning --reports having vaginal itching, denies any vaginal burning --recently sexually active after not being sexually active for a while.  Reports getting frequent yeast infections previously when she was sexually active before.    Current Medication:  Outpatient Encounter Medications as of 03/08/2024  Medication Sig   fluconazole  (DIFLUCAN ) 150 MG tablet Take 1 tablet (150 mg total) by mouth once for 1 dose. May take an additional dose after 3 days if still symptomatic.   acetaminophen  (TYLENOL ) 650 MG CR tablet Take 650 mg by mouth at bedtime.   [START ON 03/16/2024] amphetamine -dextroamphetamine  (ADDERALL) 20 MG tablet TAKE 1 TABLET BY MOUTH TWICE A DAY AS NEEDED FOR FOCUS   amphetamine -dextroamphetamine  (ADDERALL) 20 MG tablet TAKE 1 TABLET BY MOUTH TWICE A DAY AS NEEDED FOR FOCUS   amphetamine -dextroamphetamine  (ADDERALL) 20 MG tablet TAKE 1 TABLET BY MOUTH TWICE A DAY AS NEEDED FOR FOCUS   betamethasone  valerate ointment (VALISONE ) 0.1 % Apply 1 Application topically daily as needed (rash). To affected area until resolved.   Cholecalciferol (VITAMIN D) 50 MCG (2000 UT) tablet Take 2,000 Units by mouth daily.   exemestane  (AROMASIN ) 25 MG tablet Take 1 tablet (25 mg total) by mouth daily after breakfast.   hydrochlorothiazide  (HYDRODIURIL ) 25 MG tablet Take 1 tablet (25 mg total) by mouth daily.   meloxicam  (MOBIC ) 15 MG tablet TAKE 1 TABLET (15 MG TOTAL) BY MOUTH DAILY.   ondansetron  (ZOFRAN ) 8 MG tablet Take 1 tablet (8 mg total) by mouth every 8 (eight) hours as needed  for nausea or vomiting. (Patient not taking: Reported on 02/29/2024)   potassium chloride  SA (KLOR-CON  M) 20 MEQ tablet Take 1 tablet (20 mEq total) by mouth once for 1 dose.   prochlorperazine  (COMPAZINE ) 10 MG tablet Take 1 tablet (10 mg total) by mouth every 6 (six) hours as needed for vomiting or nausea. (Patient not taking: Reported on 02/29/2024)   traMADol  (ULTRAM ) 50 MG tablet Take 1 tablet (50 mg total) by mouth at bedtime as needed for moderate pain (pain score 4-6) or severe pain (pain score 7-10).   valACYclovir  (VALTREX ) 1000 MG tablet Take 1 tablet (1,000 mg total) by mouth 2 (two) times daily.   VERZENIO  100 MG tablet TAKE 1 TABLET TWICE A DAY   No facility-administered encounter medications on file as of 03/08/2024.      Medical History: Past Medical History:  Diagnosis Date   Breast cancer (HCC)    Hypertension      Vital Signs: BP 135/88   Pulse 80   Temp 97.7 F (36.5 C)   Resp 16   Ht 5' 7 (1.702 m)   Wt 213 lb 6.4 oz (96.8 kg)   SpO2 98%   BMI 33.42 kg/m    Review of Systems  Constitutional: Negative.  Negative for fatigue.  Respiratory: Negative.  Negative for cough, chest tightness, shortness of breath and wheezing.   Cardiovascular: Negative.  Negative for chest pain and palpitations.  Genitourinary:  Positive for vaginal discharge.       Vaginal itching  Physical Exam Vitals reviewed.  Constitutional:      General: She is not in acute distress.    Appearance: Normal appearance. She is obese. She is not ill-appearing.  HENT:     Head: Normocephalic and atraumatic.  Eyes:     Pupils: Pupils are equal, round, and reactive to light.  Cardiovascular:     Rate and Rhythm: Normal rate and regular rhythm.  Pulmonary:     Effort: Pulmonary effort is normal. No respiratory distress.  Neurological:     Mental Status: She is alert and oriented to person, place, and time.  Psychiatric:        Mood and Affect: Mood normal.        Behavior: Behavior  normal.       Assessment/Plan: 1. Vaginal discharge (Primary) Fluconazole  prescribed, take as ordered. Nuswab done and sent to lab.  - fluconazole  (DIFLUCAN ) 150 MG tablet; Take 1 tablet (150 mg total) by mouth once for 1 dose. May take an additional dose after 3 days if still symptomatic.  Dispense: 3 tablet; Refill: 0 - NuSwab Vaginitis Plus (VG+)  2. Vulvovaginal candidiasis Nuswab done and sent to lab. Fluconazole  prescribed, take as ordered.  - fluconazole  (DIFLUCAN ) 150 MG tablet; Take 1 tablet (150 mg total) by mouth once for 1 dose. May take an additional dose after 3 days if still symptomatic.  Dispense: 3 tablet; Refill: 0 - NuSwab Vaginitis Plus (VG+)  3. Screening for STDs (sexually transmitted diseases) Nuswab done and sent to lab  - NuSwab Vaginitis Plus (VG+)   General Counseling: Audrey Ball verbalizes understanding of the findings of todays visit and agrees with plan of treatment. I have discussed any further diagnostic evaluation that may be needed or ordered today. We also reviewed her medications today. she has been encouraged to call the office with any questions or concerns that should arise related to todays visit.    Counseling:    Orders Placed This Encounter  Procedures   NuSwab Vaginitis Plus (VG+)    Meds ordered this encounter  Medications   fluconazole  (DIFLUCAN ) 150 MG tablet    Sig: Take 1 tablet (150 mg total) by mouth once for 1 dose. May take an additional dose after 3 days if still symptomatic.    Dispense:  3 tablet    Refill:  0    Return if symptoms worsen or fail to improve.  [place] Controlled Substance Database was reviewed by me for overdose risk score (ORS)  Time spent:30 Minutes Time spent with patient included reviewing progress notes, labs, imaging studies, and discussing plan for follow up.   This patient was seen by Mardy Maxin, FNP-C in collaboration with Dr. Sigrid Bathe as a part of collaborative care agreement.  Jhamal Plucinski  R. Maxin, MSN, FNP-C Internal Medicine

## 2024-03-11 LAB — NUSWAB VAGINITIS PLUS (VG+)
Candida albicans, NAA: NEGATIVE
Candida glabrata, NAA: NEGATIVE

## 2024-03-21 ENCOUNTER — Other Ambulatory Visit: Payer: Self-pay | Admitting: Oncology

## 2024-03-21 DIAGNOSIS — C50412 Malignant neoplasm of upper-outer quadrant of left female breast: Secondary | ICD-10-CM

## 2024-03-24 ENCOUNTER — Ambulatory Visit
Admission: RE | Admit: 2024-03-24 | Discharge: 2024-03-24 | Disposition: A | Discharge status: Home / Self Care | Source: Ambulatory Visit | Attending: Oncology | Admitting: Oncology

## 2024-03-24 DIAGNOSIS — Z170 Estrogen receptor positive status [ER+]: Secondary | ICD-10-CM | POA: Insufficient documentation

## 2024-03-24 DIAGNOSIS — Z780 Asymptomatic menopausal state: Secondary | ICD-10-CM | POA: Diagnosis not present

## 2024-03-24 DIAGNOSIS — C50412 Malignant neoplasm of upper-outer quadrant of left female breast: Secondary | ICD-10-CM | POA: Insufficient documentation

## 2024-04-11 ENCOUNTER — Inpatient Hospital Stay: Attending: Oncology

## 2024-04-11 DIAGNOSIS — Z1732 Human epidermal growth factor receptor 2 negative status: Secondary | ICD-10-CM | POA: Diagnosis not present

## 2024-04-11 DIAGNOSIS — Z170 Estrogen receptor positive status [ER+]: Secondary | ICD-10-CM | POA: Diagnosis not present

## 2024-04-11 DIAGNOSIS — Z1721 Progesterone receptor positive status: Secondary | ICD-10-CM | POA: Insufficient documentation

## 2024-04-11 DIAGNOSIS — C50412 Malignant neoplasm of upper-outer quadrant of left female breast: Secondary | ICD-10-CM | POA: Diagnosis present

## 2024-04-11 LAB — CMP (CANCER CENTER ONLY)
ALT: 15 U/L (ref 0–44)
AST: 21 U/L (ref 15–41)
Albumin: 4 g/dL (ref 3.5–5.0)
Alkaline Phosphatase: 71 U/L (ref 38–126)
Anion gap: 9 (ref 5–15)
BUN: 15 mg/dL (ref 6–20)
CO2: 25 mmol/L (ref 22–32)
Calcium: 9.4 mg/dL (ref 8.9–10.3)
Chloride: 101 mmol/L (ref 98–111)
Creatinine: 0.8 mg/dL (ref 0.44–1.00)
GFR, Estimated: >60 mL/min (ref >60)
Glucose, Bld: 131 mg/dL — ABNORMAL HIGH (ref 70–99)
Potassium: 3.4 mmol/L — ABNORMAL LOW (ref 3.5–5.1)
Sodium: 135 mmol/L (ref 135–145)
Total Bilirubin: 0.5 mg/dL (ref 0.0–1.2)
Total Protein: 7.7 g/dL (ref 6.5–8.1)

## 2024-04-11 LAB — CBC WITH DIFFERENTIAL (CANCER CENTER ONLY)
Abs Immature Granulocytes: 0.02 K/uL (ref 0.00–0.07)
Basophils Absolute: 0 K/uL (ref 0.0–0.1)
Basophils Relative: 1 %
Eosinophils Absolute: 0.2 K/uL (ref 0.0–0.5)
Eosinophils Relative: 3 %
HCT: 37.2 % (ref 36.0–46.0)
Hemoglobin: 12.5 g/dL (ref 12.0–15.0)
Immature Granulocytes: 0 %
Lymphocytes Relative: 25 %
Lymphs Abs: 1.2 K/uL (ref 0.7–4.0)
MCH: 30.8 pg (ref 26.0–34.0)
MCHC: 33.6 g/dL (ref 30.0–36.0)
MCV: 91.6 fL (ref 80.0–100.0)
Monocytes Absolute: 0.4 K/uL (ref 0.1–1.0)
Monocytes Relative: 8 %
Neutro Abs: 2.9 K/uL (ref 1.7–7.7)
Neutrophils Relative %: 63 %
Platelet Count: 268 K/uL (ref 150–400)
RBC: 4.06 MIL/uL (ref 3.87–5.11)
RDW: 12.9 % (ref 11.5–15.5)
WBC Count: 4.7 K/uL (ref 4.0–10.5)
nRBC: 0 % (ref 0.0–0.2)

## 2024-04-13 ENCOUNTER — Ambulatory Visit: Admitting: Nurse Practitioner

## 2024-04-19 ENCOUNTER — Other Ambulatory Visit: Payer: Self-pay | Admitting: Oncology

## 2024-04-19 DIAGNOSIS — Z170 Estrogen receptor positive status [ER+]: Secondary | ICD-10-CM

## 2024-04-21 ENCOUNTER — Other Ambulatory Visit: Payer: Self-pay | Admitting: General Surgery

## 2024-04-21 DIAGNOSIS — Z853 Personal history of malignant neoplasm of breast: Secondary | ICD-10-CM

## 2024-05-12 ENCOUNTER — Ambulatory Visit
Admission: RE | Admit: 2024-05-12 | Discharge: 2024-05-12 | Disposition: A | Discharge status: Home / Self Care | Source: Ambulatory Visit | Attending: Radiation Oncology | Admitting: Radiation Oncology

## 2024-05-12 ENCOUNTER — Other Ambulatory Visit: Payer: Self-pay | Admitting: Nurse Practitioner

## 2024-05-12 VITALS — BP 158/100 | HR 60 | Temp 97.0°F | Resp 16 | Ht 67.0 in | Wt 211.5 lb

## 2024-05-12 DIAGNOSIS — G8929 Other chronic pain: Secondary | ICD-10-CM

## 2024-05-12 DIAGNOSIS — Z923 Personal history of irradiation: Secondary | ICD-10-CM | POA: Diagnosis not present

## 2024-05-12 DIAGNOSIS — Z170 Estrogen receptor positive status [ER+]: Secondary | ICD-10-CM | POA: Diagnosis not present

## 2024-05-12 DIAGNOSIS — C50412 Malignant neoplasm of upper-outer quadrant of left female breast: Secondary | ICD-10-CM | POA: Insufficient documentation

## 2024-05-12 DIAGNOSIS — Z79811 Long term (current) use of aromatase inhibitors: Secondary | ICD-10-CM | POA: Insufficient documentation

## 2024-05-12 NOTE — Progress Notes (Signed)
 Radiation Oncology Follow up Note  Name: Audrey Ball   Date:   05/12/2024 MRN:  [id number] DOB: 08-Aug-1970    This 53 y.o. female presents to the clinic today for 22-month follow-up status post whole breast radiation to her left breast for stage IIb (pT2 pN1 SN M0) ER positive invasive lobular carcinoma left breast.  REFERRING PROVIDER: [name], NP  HPI: Patient is a 53 year old female now out 717 completed whole breast radiation to her left breast for stage IIb ER positive invasive mammary carcinoma she still having some tenderness in the left axilla which I explained to her is surgically related.  She otherwise specifically denies breast tenderness cough or bone pain.  She is currently on.  Aromasin  tolerating that well without side effect.  She has been scheduled for mammogram although that is not yet been performed.  COMPLICATIONS OF TREATMENT: none  FOLLOW UP COMPLIANCE: keeps appointments   PHYSICAL EXAM:  BP (!) 158/100   Pulse 60   Temp (!) 97 F (36.1 C)   Resp 16   Ht 5' 7 (1.702 m)   Wt 211 lb 8 oz (95.9 kg)   BMI 33.13 kg/m  Lungs are clear to A&P cardiac examination essentially unremarkable with regular rate and rhythm. No dominant mass or nodularity is noted in either breast in 2 positions examined. Incision is well-healed. No axillary or supraclavicular adenopathy is appreciated. Cosmetic result is excellent.  There is still some firmness of the left breast consistent with surgery and radiation changes.  Well-developed well-nourished patient in NAD. HEENT reveals PERLA, EOMI, discs not visualized.  Oral cavity is clear. No oral mucosal lesions are identified. Neck is clear without evidence of cervical or supraclavicular adenopathy. Lungs are clear to A&P. Cardiac examination is essentially unremarkable with regular rate and rhythm without murmur rub or thrill. Abdomen is benign with no organomegaly or masses noted. Motor sensory and DTR levels are equal and  symmetric in the upper and lower extremities. Cranial nerves II through XII are grossly intact. Proprioception is intact. No peripheral adenopathy or edema is identified. No motor or sensory levels are noted. Crude visual fields are within normal range.  RADIOLOGY RESULTS: No current films for review  PLAN: At the present time patient is doing well with no evidence of disease now about 7 months from whole breast radiation.  I am pleased with her overall progress.  She continues on Aromasin  without side effect.  I have asked to see her back in 6 months for follow-up and then we will start once a year follow-up visits.  Patient has inquired about LDR for osteoarthritis of her knees.  She will think about that and give us  a call should she decide to proceed with that.  I would like to take this opportunity to thank you for allowing me to participate in the care of your patient.[name] Marcey Penton, MD

## 2024-05-26 ENCOUNTER — Ambulatory Visit
Admission: RE | Admit: 2024-05-26 | Discharge: 2024-05-26 | Disposition: A | Source: Ambulatory Visit | Attending: General Surgery

## 2024-05-26 DIAGNOSIS — R921 Mammographic calcification found on diagnostic imaging of breast: Secondary | ICD-10-CM | POA: Diagnosis not present

## 2024-05-26 DIAGNOSIS — Z853 Personal history of malignant neoplasm of breast: Secondary | ICD-10-CM

## 2024-05-26 DIAGNOSIS — R92323 Mammographic fibroglandular density, bilateral breasts: Secondary | ICD-10-CM | POA: Diagnosis not present

## 2024-05-26 DIAGNOSIS — C50919 Malignant neoplasm of unspecified site of unspecified female breast: Secondary | ICD-10-CM | POA: Diagnosis not present

## 2024-05-30 ENCOUNTER — Ambulatory Visit

## 2024-05-30 ENCOUNTER — Inpatient Hospital Stay: Admitting: Oncology

## 2024-05-30 ENCOUNTER — Inpatient Hospital Stay: Attending: Oncology

## 2024-05-30 ENCOUNTER — Encounter: Payer: Self-pay | Admitting: Oncology

## 2024-05-30 VITALS — BP 149/73 | HR 68 | Temp 96.7°F | Resp 18 | Ht 67.0 in | Wt 214.6 lb

## 2024-05-30 DIAGNOSIS — Z170 Estrogen receptor positive status [ER+]: Secondary | ICD-10-CM | POA: Diagnosis not present

## 2024-05-30 DIAGNOSIS — Z79899 Other long term (current) drug therapy: Secondary | ICD-10-CM

## 2024-05-30 DIAGNOSIS — I10 Essential (primary) hypertension: Secondary | ICD-10-CM | POA: Insufficient documentation

## 2024-05-30 DIAGNOSIS — C50412 Malignant neoplasm of upper-outer quadrant of left female breast: Secondary | ICD-10-CM | POA: Insufficient documentation

## 2024-05-30 DIAGNOSIS — Z79811 Long term (current) use of aromatase inhibitors: Secondary | ICD-10-CM

## 2024-05-30 LAB — CMP (CANCER CENTER ONLY)
ALT: 22 U/L (ref 0–44)
AST: 24 U/L (ref 15–41)
Albumin: 4.3 g/dL (ref 3.5–5.0)
Alkaline Phosphatase: 101 U/L (ref 38–126)
Anion gap: 10 (ref 5–15)
BUN: 16 mg/dL (ref 6–20)
CO2: 29 mmol/L (ref 22–32)
Calcium: 10 mg/dL (ref 8.9–10.3)
Chloride: 102 mmol/L (ref 98–111)
Creatinine: 0.81 mg/dL (ref 0.44–1.00)
GFR, Estimated: >60 mL/min (ref >60)
Glucose, Bld: 95 mg/dL (ref 70–99)
Potassium: 3.8 mmol/L (ref 3.5–5.1)
Sodium: 141 mmol/L (ref 135–145)
Total Bilirubin: 0.3 mg/dL (ref 0.0–1.2)
Total Protein: 7.7 g/dL (ref 6.5–8.1)

## 2024-05-30 LAB — CBC WITH DIFFERENTIAL (CANCER CENTER ONLY)
Abs Immature Granulocytes: 0.02 K/uL (ref 0.00–0.07)
Basophils Absolute: 0 K/uL (ref 0.0–0.1)
Basophils Relative: 1 %
Eosinophils Absolute: 0.2 K/uL (ref 0.0–0.5)
Eosinophils Relative: 3 %
HCT: 39.4 % (ref 36.0–46.0)
Hemoglobin: 13.1 g/dL (ref 12.0–15.0)
Immature Granulocytes: 0 %
Lymphocytes Relative: 25 %
Lymphs Abs: 1.3 K/uL (ref 0.7–4.0)
MCH: 30.2 pg (ref 26.0–34.0)
MCHC: 33.2 g/dL (ref 30.0–36.0)
MCV: 90.8 fL (ref 80.0–100.0)
Monocytes Absolute: 0.4 K/uL (ref 0.1–1.0)
Monocytes Relative: 9 %
Neutro Abs: 3.1 K/uL (ref 1.7–7.7)
Neutrophils Relative %: 62 %
Platelet Count: 221 K/uL (ref 150–400)
RBC: 4.34 MIL/uL (ref 3.87–5.11)
RDW: 12.7 % (ref 11.5–15.5)
WBC Count: 5.1 K/uL (ref 4.0–10.5)
nRBC: 0 % (ref 0.0–0.2)

## 2024-05-30 NOTE — Progress Notes (Signed)
 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  This was followed by diagnostic bilateral mammogram which showed a 12 mm mass at the site of palpable concern concerning for malignancy along with nonenlarged left axillary lymph node.  Ultrasound also confirmed the same findings.  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  Tumor was ER 100% positive, PR 70% positive and HER2 negative +1.   Final pathology showed tumor size of 3.6 cm with negative margins grade 2 invasive ductal carcinoma with lobular features.  3 out of 5 sentinel lymph nodes positive for malignancy.  2 of them had macrometastases and 1 with micrometastases.  Extranodal extension present.  Oncotype testing was sent which came back at intermediate risk with a recurrence score of 12.  She did not require any adjuvant chemotherapy and was started on letrozole  in March 2025.  She completed adjuvant radiation therapy.  She started taking Verzenio  in May 2025  Interval history- Discussed the use of AI scribe software for  clinical note transcription with the patient, who gave verbal consent to proceed.  History of Present Illness   [name] is a 53 year old female with lymph node positive breast cancer who presents with concerns about elevated blood pressure and medication side effects.  She is currently taking hydrochlorothiazide  25 mg daily for hypertension but has not been monitoring her blood pressure at home and does not own a blood pressure cuff.  She has a history of lymph node positive breast cancer and is on endocrine therapy. She was previously on letrozole  but discontinued it due to soreness. She is now taking exemestane  daily and experiences constipation as a side effect.  She is also on Verzenio  which she started in May 2025 but she experiences dizziness and does not take it consistently due to these side effects.      ECOG PS- 0 Pain scale- 0   Review of systems- Review of Systems  Constitutional:  Negative for chills, fever, malaise/fatigue and weight loss.  HENT:  Negative for congestion, ear discharge and nosebleeds.   Eyes:  Negative for blurred vision.  Respiratory:  Negative for cough, hemoptysis, sputum production, shortness of breath and wheezing.   Cardiovascular:  Negative for chest pain, palpitations, orthopnea and claudication.  Gastrointestinal:  Negative for abdominal pain, blood in stool, constipation, diarrhea, heartburn, melena, nausea and vomiting.  Genitourinary:  Negative for dysuria, flank pain, frequency, hematuria and urgency.  Musculoskeletal:  Negative for back pain, joint pain and myalgias.  Skin:  Negative for rash.  Neurological:  Negative for dizziness, tingling, focal weakness, seizures, weakness and headaches.  Endo/Heme/Allergies:  Does not bruise/bleed easily.  Psychiatric/Behavioral:  Negative for depression  and suicidal ideas. The patient does not have insomnia.       Allergies  Allergen Reactions   Bactrim  [Sulfamethoxazole -Trimethoprim ]  Hives and Swelling    Swelling in lips and leg   Sulfa  Antibiotics Hives   Penicillins Rash   Propoxyphene Other (See Comments)    headache      Past Medical History:  Diagnosis Date   Breast cancer (HCC)    Hypertension      Past Surgical History:  Procedure Laterality Date   BACK SURGERY  2017   BREAST BIOPSY Left 05/27/2023   u/s bx,mass 10:00, RIBBON clip-path pending   BREAST BIOPSY Left 05/27/2023   u/s bx, axilla/BUTTERFLY clippath pending   BREAST BIOPSY Left 05/27/2023   US  LT BREAST BX W LOC DEV 1ST LESION IMG BX SPEC US  GUIDE 05/27/2023 [facility]   BREAST BIOPSY Left 06/15/2023   US  LT RADIO FREQUENCY TAG LOC US  GUIDE 06/15/2023 [facility]   BREAST BIOPSY Left 06/15/2023   US  LT RADIO FREQUENCY TAG EA ADD LESION LOC US  GUIDE 06/15/2023 [facility]   COLONOSCOPY WITH PROPOFOL  N/A 08/16/2021   Procedure: COLONOSCOPY WITH PROPOFOL ;  Surgeon: Unk Corinn Skiff, MD;  Location: [facility];  Service: Gastroenterology;  Laterality: N/A;   GALLBLADDER SURGERY  1999   PART MASTECTOMY,RADIO FREQUENCY LOCALIZER,AXILLARY SENTINEL NODE BIOPSY Left 06/22/2023   Procedure: PART MASTECTOMY,RADIO FREQUENCY LOCALIZER,AXILLARY SENTINEL NODE BIOPSY;  Surgeon: Rodolph Romano, MD;  Location: [facility];  Service: General;  Laterality: Left;   TOTAL VAGINAL HYSTERECTOMY  2016   bilateral salpingectomy at [facility] for abnormal uterine bleeding    Social History   Socioeconomic History   Marital status: Single    Spouse name: Not on file   Number of children: Not on file   Years of education: Not on file   Highest education level: Not on file  Occupational History   Not on file  Tobacco Use   Smoking status: Never   Smokeless tobacco: Never  Vaping Use   Vaping status: Never Used  Substance and Sexual Activity   Alcohol use: No   Drug use: No   Sexual activity: Yes    Birth control/protection: Surgical  Other Topics Concern   Not on file  Social  History Narrative   Not on file   Social Drivers of Health   Financial Resource Strain: Low Risk  (06/04/2023)   Received [facility]   Overall Financial Resource Strain (CARDIA)    Difficulty of Paying Living Expenses: Not hard at all  Food Insecurity: No Food Insecurity (06/04/2023)   Received from [facility]   Hunger Vital Sign    Within the past 12 months, you worried that your food would run out before you got the money to buy more.: Never true    Within the past 12 months, the food you bought just didn't last and you didn't have money to get more.: Never true  Transportation Needs: No Transportation Needs (06/04/2023)   Received from [facility] - Transportation    In the past 12 months, has lack of transportation kept you from medical appointments or from getting medications?: No    Lack of Transportation (Non-Medical): No  Physical Activity: Not on file  Stress: Not on file  Social Connections: Not on file  Intimate Partner Violence: Not At Risk (06/03/2023)   Humiliation, Afraid, Rape, and Kick questionnaire    Fear of Current or Ex-Partner: No    Emotionally  Abused: No    Physically Abused: No    Sexually Abused: No    Family History  Problem Relation Age of Onset   Leukemia Mother 20   Hypertension Mother    Diabetes Mother    Lung cancer Father 21   Throat cancer Brother        Currently in remission.   Lung cancer Maternal Grandmother        d.>50   Breast cancer Paternal Grandmother        dx 60s     Current Outpatient Medications:    acetaminophen  (TYLENOL ) 650 MG CR tablet, Take 650 mg by mouth at bedtime., Disp: , Rfl:    amphetamine -dextroamphetamine  (ADDERALL) 20 MG tablet, TAKE 1 TABLET BY MOUTH TWICE A DAY AS NEEDED FOR FOCUS, Disp: 60 tablet, Rfl: 0   betamethasone  valerate ointment (VALISONE ) 0.1 %, Apply 1 Application topically daily as needed (rash). To affected area until  resolved., Disp: 45 g, Rfl: 5   Cholecalciferol (VITAMIN D) 50 MCG (2000 UT) tablet, Take 2,000 Units by mouth daily., Disp: , Rfl:    exemestane  (AROMASIN ) 25 MG tablet, Take 1 tablet (25 mg total) by mouth daily after breakfast., Disp: 30 tablet, Rfl: 2   hydrochlorothiazide  (HYDRODIURIL ) 25 MG tablet, Take 1 tablet (25 mg total) by mouth daily., Disp: 90 tablet, Rfl: 1   meloxicam  (MOBIC ) 15 MG tablet, TAKE 1 TABLET (15 MG TOTAL) BY MOUTH DAILY., Disp: 90 tablet, Rfl: 1   traMADol  (ULTRAM ) 50 MG tablet, Take 1 tablet (50 mg total) by mouth at bedtime as needed for moderate pain (pain score 4-6) or severe pain (pain score 7-10)., Disp: 30 tablet, Rfl: 2   valACYclovir  (VALTREX ) 1000 MG tablet, Take 1 tablet (1,000 mg total) by mouth 2 (two) times daily., Disp: 60 tablet, Rfl: 3   VERZENIO  100 MG tablet, TAKE 1 TABLET TWICE A DAY, Disp: 56 tablet, Rfl: 0   amphetamine -dextroamphetamine  (ADDERALL) 20 MG tablet, TAKE 1 TABLET BY MOUTH TWICE A DAY AS NEEDED FOR FOCUS (Patient not taking: Reported on 05/12/2024), Disp: 60 tablet, Rfl: 0   amphetamine -dextroamphetamine  (ADDERALL) 20 MG tablet, TAKE 1 TABLET BY MOUTH TWICE A DAY AS NEEDED FOR FOCUS (Patient not taking: Reported on 05/12/2024), Disp: 60 tablet, Rfl: 0   ondansetron  (ZOFRAN ) 8 MG tablet, Take 1 tablet (8 mg total) by mouth every 8 (eight) hours as needed for nausea or vomiting. (Patient not taking: Reported on 02/29/2024), Disp: 20 tablet, Rfl: 3   potassium chloride  SA (KLOR-CON  M) 20 MEQ tablet, Take 1 tablet (20 mEq total) by mouth once for 1 dose. (Patient not taking: Reported on 05/30/2024), Disp: 7 tablet, Rfl: 0   prochlorperazine  (COMPAZINE ) 10 MG tablet, Take 1 tablet (10 mg total) by mouth every 6 (six) hours as needed for vomiting or nausea. (Patient not taking: Reported on 02/29/2024), Disp: 30 tablet, Rfl: 3  Physical exam:  Vitals:   05/30/24 1322 05/30/24 1325  BP: (!) 154/95 (!) 149/73  Pulse: 68   Resp: 18   Temp: (!) 96.7  F (35.9 C)   TempSrc: Tympanic   SpO2: 100%   Weight: 214 lb 9.6 oz (97.3 kg)   Height: 5' 7 (1.702 m)    Physical Exam Cardiovascular:     Rate and Rhythm: Normal rate and regular rhythm.     Heart sounds: Normal heart sounds.  Pulmonary:     Effort: Pulmonary effort is normal.     Breath sounds: Normal breath sounds.  Skin:    General: Skin is warm and dry.  Neurological:     Mental Status: She is alert and oriented to person, place, and time.      I have personally reviewed labs listed below:    Latest Ref Rng & Units 05/30/2024    1:03 PM  CMP  Glucose 70 - 99 mg/dL 95   BUN 6 - 20 mg/dL 16   Creatinine 9.55 - 1.00 mg/dL 9.18   Sodium 864 - 854 mmol/L 141   Potassium 3.5 - 5.1 mmol/L 3.8   Chloride 98 - 111 mmol/L 102   CO2 22 - 32 mmol/L 29   Calcium 8.9 - 10.3 mg/dL 89.9   Total Protein 6.5 - 8.1 g/dL 7.7   Total Bilirubin 0.0 - 1.2 mg/dL 0.3   Alkaline Phos 38 - 126 U/L 101   AST 15 - 41 U/L 24   ALT 0 - 44 U/L 22       Latest Ref Rng & Units 05/30/2024    1:04 PM  CBC  WBC 4.0 - 10.5 K/uL 5.1   Hemoglobin 12.0 - 15.0 g/dL 86.8   Hematocrit 63.9 - 46.0 % 39.4   Platelets 150 - 400 K/uL 221    I have personally reviewed Radiology images listed below: No images are attached to the encounter.  MM 3D DIAGNOSTIC MAMMOGRAM BILATERAL BREAST Result Date: 05/26/2024 CLINICAL DATA:  History of LEFT breast lumpectomy in December of 2024 for invasive ductal carcinoma with 2 positive lymph nodes. Status post adjuvant radiation. On Aromasin . Endorses soreness and heaviness of the LEFT breast since her surgery. EXAM: DIGITAL DIAGNOSTIC BILATERAL MAMMOGRAM WITH TOMOSYNTHESIS AND CAD TECHNIQUE: Bilateral digital diagnostic mammography and breast tomosynthesis was performed. The images were evaluated with computer-aided detection. COMPARISON:  Previous exam(s). ACR Breast Density Category b: There are scattered areas of fibroglandular density. FINDINGS: There is density and  architectural distortion within the LEFT breast, corresponding to the site of prior lumpectomy. Spot compression magnification view(s) demonstrate no definite evidence of recurrent malignancy. Likely early dystrophic calcifications spanning 2 mm are noted within the lumpectomy site, a probably benign finding. Additional new benign dystrophic/rim calcifications are noted elsewhere near the scar line in the LEFT breast. There is extensive skin and trabecular thickening in keeping with history radiation therapy. There is no mammographic evidence of malignancy in the RIGHT breast. IMPRESSION: Posttreatment changes of the LEFT breast with faint calcifications at the lumpectomy site favored to have an early dystrophic morphology and considered probably benign. A six-month follow-up diagnostic mammogram of the LEFT breast is recommended. Aside from the patient's posttreatment changes, there is no mammographic etiology for nonfocal LEFT breast pain identified. There is no mammographic evidence of malignancy in the RIGHT breast. RECOMMENDATION: Six-month follow-up diagnostic mammogram of the LEFT breast (with LEFT breast ultrasound if deemed necessary by the radiologist). I have discussed the findings and recommendations with the patient. If applicable, a reminder letter will be sent to the patient regarding the next appointment. BI-RADS CATEGORY  3: Probably benign. Electronically Signed   By: Norleen Croak M.D.   On: 05/26/2024 13:44     Assessment and plan- Patient is a 53 y.o. 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  She did not require adjuvant chemotherapy.  She is presently on exemestane  plus Verzenio  and this is a routine follow-up visit  Assessment and  Plan    Estrogen receptor positive left breast cancer, upper-outer quadrant On exemestane  and Verzenio . Dizziness with  Verzenio  affects adherence. Exemestane  causes constipation.  - Normal bone density. Discussed Zometa infusion for recurrence risk reduction and decreasing the risk of skeletal related metastases.  Discussed potential side effects of Zometa including all but not limited to osteonecrosis of the jaw.  She will need dental clearance before proceeding.   - Continue exemestane  daily. - Adjust Verzenio  to 50 mg in the morning and 100 mg at night. - Consider Miralax or Senokot for constipation. - Discuss Zometa infusion with dentist for clearance. - Provided information about Zometa infusion. - Reassess in three months regarding Zometa decision.  Hypertension Blood pressure elevated on hydrochlorothiazide  25 mg. Office readings in the 140s. No home monitoring. Discussed home monitoring and potential need for additional medication if above 130. - Obtain a blood pressure cuff for home monitoring. - Monitor blood pressure at home. - Consult primary care physician for additional antihypertensive medication if home readings are consistently above 130.         Visit Diagnosis 1. Malignant neoplasm of upper-outer quadrant of left breast in female, estrogen receptor positive (HCC)   2. Use of exemestane  (Aromasin )   3. High risk medication use      Dr. Annah Skene, MD, MPH [facility] [phone] 05/30/2024 2:19 PM

## 2024-05-30 NOTE — Progress Notes (Signed)
 Patient states she stays sore all of time.

## 2024-06-02 ENCOUNTER — Other Ambulatory Visit: Payer: Self-pay | Admitting: Oncology

## 2024-06-08 ENCOUNTER — Other Ambulatory Visit: Payer: Self-pay | Admitting: General Surgery

## 2024-06-08 DIAGNOSIS — R921 Mammographic calcification found on diagnostic imaging of breast: Secondary | ICD-10-CM

## 2024-06-08 DIAGNOSIS — R928 Other abnormal and inconclusive findings on diagnostic imaging of breast: Secondary | ICD-10-CM

## 2024-06-09 DIAGNOSIS — C50212 Malignant neoplasm of upper-inner quadrant of left female breast: Secondary | ICD-10-CM | POA: Diagnosis not present

## 2024-06-09 DIAGNOSIS — Z170 Estrogen receptor positive status [ER+]: Secondary | ICD-10-CM | POA: Diagnosis not present

## 2024-06-09 NOTE — Progress Notes (Signed)
 " HISTORY OF PRESENT ILLNESS:   Audrey Ball is a 53 y.o. female who presents for breast cancer surveillance with evaluation of mammogram and breast physical exam  Patient originally presented with self palpated mass of the left breast.  This led to diagnostic mammogram and breast ultrasound showing a 12 mm mass at the 10 o'clock position in the left breast.  An abnormal lymph node was also identified on the left axilla.  Core biopsy of the breast mass and axillary lymph node were performed showing invasive mammary carcinoma with lobular features.  The lymph node showed metastatic mammary carcinoma.  Bilateral breast MRI was performed showing a 3 cm upper inner quadrant mass of the left breast with again abnormal lymph node in the axillary area.  No other abnormalities are identified on the same or contralateral breast.  Medical oncology did not recommended neoadjuvant chemotherapy.  Patient proceeded with partial mastectomy with targeted axillary node dissection and sentinel lymph node biopsy.  Final pathology shows 2.6 cm invasive lobular carcinoma.  2 mg were found with macrometastases and 1 lymph node with micrometastasis.  Oncotype testing was intermediate risk with recurrence score of 12.  Medical oncology did not recommended adjuvant chemotherapy.  Patient related adjuvant radiation therapy she was started on letrozole  on March 2025 and Verzenio  in May 2025.  As per chart review patient was not taking neither of these medication consistently due to side effects.  She changed to exemestane  and is tolerating better.  She is getting more consistent with the Verzenio .  Patient comes today with new bilateral diagnostic mammogram and 05/26/2024.  This mammogram shows architectural distortion with calcification at the area of partial mastectomy.  Calcification is a dystrophic distribution.  I personally evaluated the images.    PAST MEDICAL HISTORY:.  Past Medical History:  Diagnosis Date    Arthritis    HTN (hypertension)    Osteoarthritis   .   PAST SURGICAL HISTORY:. Past Surgical History:  Procedure Laterality Date   robotic partial hysterectomy  2017   MASTECTOMY PARTIAL Left 06/22/2023   Dr Lucas Catchings  ---- w/ RF &SN   back surgery     CHOLECYSTECTOMY     .   MEDICATIONS:. Outpatient Encounter Medications as of 06/09/2024  Medication Sig Dispense Refill   acetaminophen  (TYLENOL ) 650 MG ER tablet Take 650 mg by mouth every 8 (eight) hours as needed     betamethasone  valerate (VALISONE ) 0.1 % ointment Apply 1 Application topically as needed     cholecalciferol (VITAMIN D3) 1000 unit capsule Take 1,000 Units by mouth once daily     dextroamphetamine -amphetamine  (ADDERALL) 20 mg tablet every morning     hydrochlorothiazide  (HYDRODIURIL ) 25 MG tablet Take 25 mg by mouth once daily.     meloxicam  (MOBIC ) 15 MG tablet Take 1 tablet by mouth once daily     traMADol  (ULTRAM ) 50 mg tablet TAKE 1-2 TABLETS BY MOUTH EVERY SIX HOURS 30 tablet 0   valACYclovir  (VALTREX ) 1000 MG tablet      cyclobenzaprine  (FLEXERIL ) 10 MG tablet Take 1 tablet (10 mg total) by mouth 3 (three) times daily as needed for Muscle spasms. (Patient not taking: Reported on 06/09/2024) 60 tablet 0   ferrous sulfate 325 (65 FE) MG EC tablet Take 325 mg by mouth once daily. (Patient not taking: Reported on 06/09/2024)     letrozole  (FEMARA ) 2.5 mg tablet Take 2.5 mg by mouth once daily (Patient not taking: Reported on 06/09/2024)     methocarbamol (  ROBAXIN) 500 MG tablet Take 1 tablet (500 mg total) by mouth 4 (four) times daily. (Patient not taking: Reported on 06/09/2024) 60 tablet 0   naproxen (NAPROSYN) 500 MG tablet Take 1 tablet (500 mg total) by mouth 2 (two) times daily with meals. (Patient not taking: Reported on 06/09/2024) 30 tablet 0   naproxen (NAPROSYN) 500 MG tablet Take 1 tablet (500 mg total) by mouth 2 (two) times daily with meals. (Patient not taking: Reported on  06/09/2024) 30 tablet 0   predniSONE  (DELTASONE ) 10 MG tablet Take 1 tablet (10 mg total) by mouth once daily. 10 day taper, 5,5,4,4,3,3,2,2,1,1. (Patient not taking: Reported on 06/09/2024) 30 tablet 0   traMADol  (ULTRAM ) 50 mg tablet Take 50 mg by mouth every 6 (six) hours as needed for Pain.     No facility-administered encounter medications on file as of 06/09/2024.  Audrey Ball   ALLERGIES:. Amoxicillin, Penicillins, Propoxyphene n-acetaminophen , and Sulfa  (sulfonamide antibiotics).   SOCIAL HISTORY:. Social History   Socioeconomic History   Marital status: Single  Tobacco Use   Smoking status: Never    Passive exposure: Never   Smokeless tobacco: Never  Vaping Use   Vaping status: Never Used  Substance and Sexual Activity   Alcohol use: Yes    Comment: socially   Drug use: No   Sexual activity: Defer   Social Drivers of Health   Financial Resource Strain: Low Risk  (06/04/2023)   Overall Financial Resource Strain (CARDIA)    Difficulty of Paying Living Expenses: Not hard at all  Food Insecurity: No Food Insecurity (06/04/2023)   Hunger Vital Sign    Worried About Running Out of Food in the Last Year: Never true    Ran Out of Food in the Last Year: Never true  Transportation Needs: No Transportation Needs (06/04/2023)   PRAPARE - Administrator, Civil Service (Medical): No    Lack of Transportation (Non-Medical): No  .  FAMILY HISTORY:. Family History  Problem Relation Name Age of Onset   High blood pressure (Hypertension) Mother     Leukemia Mother     High blood pressure (Hypertension) Father     Lung cancer Father     Throat cancer Brother     .    PHYSICAL EXAM: Vitals:   06/09/24 1606  BP: (!) 140/93  Pulse: 81   Ht:170.2 cm (5' 7) Wt:95.3 kg (210 lb) ADJ:Anib surface area is 2.12 meters squared. Body mass index is 32.89 kg/m.Audrey Ball  GENERAL: The patient is awake alert oriented ambulatory and in no acute distress.  BREAST:  Both breasts examined in the sitting and supine position.  There is significant skin thickening of the medial aspect of the left breast with radiation changes such as darkening of the skin.  Otherwise no significant palpable mass or concerning skin changes.  EXTREMITIES: Well-developed well-nourished symmetrical with no dependent edema.  NEUROLOGICAL: Awake alert oriented, facial expression symmetrical, moving all extremities.   IMPRESSION:.   Malignant neoplasm of upper-inner quadrant of left breast in female, estrogen receptor positive (CMS/HHS-HCC) [C50.212, Z17.0] - S/p partial mastectomy with sentinel lymph node biopsy on 06/22/2023 Final pathology shows invasive lobular carcinoma ER positive -Sentinel lymph nodes shows 2 lymph node with macrometastases, 1 lymph node with micrometastasis -Patient completed adjuvant radiation therapy -Patient currently on adjuvant endocrine therapy -Medical oncology recommended CDK inhibitors for 2 years - As per medical oncology, Oncotype score intermediate and no recommendation of adjuvant chemotherapy -Surveillance mammogram shows dystrophic calcification at the  area of partial mastectomy.  Will follow-up with close mammogram in 6 months   PLAN:.  Expect left breast diagnostic mammogram in 6 month Avoid needle stick or blood pressure measurement on left arm Follow up with oncologist as planned Will follow you with breast physical exam and evaluation of mammogram in 11-months Contact us  if han any question or concern    Lucas Sjogren, MD  Electronically signed by Lucas Sjogren, MD  "

## 2024-07-13 ENCOUNTER — Telehealth: Payer: Self-pay

## 2024-07-13 ENCOUNTER — Other Ambulatory Visit: Payer: Self-pay

## 2024-07-13 MED ORDER — AZITHROMYCIN 250 MG PO TABS: ORAL_TABLET | ORAL | 0 refills | Status: AC

## 2024-07-13 NOTE — Telephone Encounter (Signed)
 As per alyssa sent ZPak and advised if not feeling better need appt

## 2024-09-07 ENCOUNTER — Inpatient Hospital Stay: Admitting: Oncology

## 2024-09-07 ENCOUNTER — Inpatient Hospital Stay

## 2024-10-26 ENCOUNTER — Encounter: Admitting: Nurse Practitioner
